# Patient Record
Sex: Female | Born: 1945
Health system: Southern US, Community
[De-identification: ages and names within clinical notes are randomized; demographics above are authoritative.]

## PROBLEM LIST (undated history)

## (undated) DIAGNOSIS — M797 Fibromyalgia: Secondary | ICD-10-CM

## (undated) DIAGNOSIS — I779 Disorder of arteries and arterioles, unspecified: Secondary | ICD-10-CM

## (undated) DIAGNOSIS — Z780 Asymptomatic menopausal state: Secondary | ICD-10-CM

## (undated) DIAGNOSIS — I739 Peripheral vascular disease, unspecified: Secondary | ICD-10-CM

## (undated) DIAGNOSIS — E039 Hypothyroidism, unspecified: Secondary | ICD-10-CM

## (undated) DIAGNOSIS — J45909 Unspecified asthma, uncomplicated: Secondary | ICD-10-CM

## (undated) DIAGNOSIS — M1990 Unspecified osteoarthritis, unspecified site: Secondary | ICD-10-CM

## (undated) DIAGNOSIS — Z9109 Other allergy status, other than to drugs and biological substances: Secondary | ICD-10-CM

## (undated) DIAGNOSIS — I10 Essential (primary) hypertension: Secondary | ICD-10-CM

## (undated) DIAGNOSIS — E785 Hyperlipidemia, unspecified: Secondary | ICD-10-CM

## (undated) DIAGNOSIS — K219 Gastro-esophageal reflux disease without esophagitis: Secondary | ICD-10-CM

## (undated) HISTORY — PX: ABDOMINAL HYSTERECTOMY: SHX81

## (undated) HISTORY — PX: TONSILLECTOMY: SUR1361

---

## 1999-04-13 ENCOUNTER — Other Ambulatory Visit: Admission: RE | Admit: 1999-04-13 | Discharge: 1999-04-13 | Payer: Self-pay | Admitting: *Deleted

## 2000-01-29 ENCOUNTER — Other Ambulatory Visit: Admission: RE | Admit: 2000-01-29 | Discharge: 2000-01-29 | Payer: Self-pay | Admitting: *Deleted

## 2002-07-06 ENCOUNTER — Ambulatory Visit (HOSPITAL_COMMUNITY): Admission: RE | Admit: 2002-07-06 | Discharge: 2002-07-06 | Payer: Self-pay | Admitting: Internal Medicine

## 2002-07-06 ENCOUNTER — Encounter: Payer: Self-pay | Admitting: Internal Medicine

## 2002-08-13 ENCOUNTER — Encounter: Payer: Self-pay | Admitting: Internal Medicine

## 2002-08-13 ENCOUNTER — Ambulatory Visit (HOSPITAL_COMMUNITY): Admission: RE | Admit: 2002-08-13 | Discharge: 2002-08-13 | Payer: Self-pay | Admitting: Internal Medicine

## 2002-08-20 ENCOUNTER — Encounter (INDEPENDENT_AMBULATORY_CARE_PROVIDER_SITE_OTHER): Payer: Self-pay | Admitting: *Deleted

## 2002-08-20 ENCOUNTER — Ambulatory Visit (HOSPITAL_COMMUNITY): Admission: RE | Admit: 2002-08-20 | Discharge: 2002-08-20 | Payer: Self-pay | Admitting: Internal Medicine

## 2002-08-20 ENCOUNTER — Encounter: Payer: Self-pay | Admitting: Internal Medicine

## 2002-11-17 ENCOUNTER — Observation Stay (HOSPITAL_COMMUNITY): Admission: RE | Admit: 2002-11-17 | Discharge: 2002-11-18 | Payer: Self-pay | Admitting: General Surgery

## 2002-11-17 ENCOUNTER — Encounter (INDEPENDENT_AMBULATORY_CARE_PROVIDER_SITE_OTHER): Payer: Self-pay

## 2004-06-05 ENCOUNTER — Ambulatory Visit (HOSPITAL_COMMUNITY): Admission: RE | Admit: 2004-06-05 | Discharge: 2004-06-05 | Payer: Self-pay | Admitting: *Deleted

## 2004-06-05 ENCOUNTER — Encounter (INDEPENDENT_AMBULATORY_CARE_PROVIDER_SITE_OTHER): Payer: Self-pay | Admitting: *Deleted

## 2005-04-23 ENCOUNTER — Emergency Department (HOSPITAL_COMMUNITY): Admission: EM | Admit: 2005-04-23 | Discharge: 2005-04-23 | Payer: Self-pay | Admitting: Family Medicine

## 2005-08-27 ENCOUNTER — Ambulatory Visit (HOSPITAL_COMMUNITY): Admission: RE | Admit: 2005-08-27 | Discharge: 2005-08-27 | Payer: Self-pay | Admitting: *Deleted

## 2005-08-27 ENCOUNTER — Encounter (INDEPENDENT_AMBULATORY_CARE_PROVIDER_SITE_OTHER): Payer: Self-pay | Admitting: Specialist

## 2006-04-26 ENCOUNTER — Emergency Department (HOSPITAL_COMMUNITY): Admission: EM | Admit: 2006-04-26 | Discharge: 2006-04-26 | Payer: Self-pay | Admitting: Family Medicine

## 2007-02-23 ENCOUNTER — Emergency Department (HOSPITAL_COMMUNITY): Admission: EM | Admit: 2007-02-23 | Discharge: 2007-02-23 | Payer: Self-pay | Admitting: Emergency Medicine

## 2007-11-10 ENCOUNTER — Encounter (INDEPENDENT_AMBULATORY_CARE_PROVIDER_SITE_OTHER): Payer: Self-pay | Admitting: *Deleted

## 2007-11-10 ENCOUNTER — Ambulatory Visit (HOSPITAL_COMMUNITY): Admission: RE | Admit: 2007-11-10 | Discharge: 2007-11-10 | Payer: Self-pay | Admitting: *Deleted

## 2009-01-09 ENCOUNTER — Emergency Department (HOSPITAL_COMMUNITY): Admission: EM | Admit: 2009-01-09 | Discharge: 2009-01-09 | Payer: Self-pay | Admitting: Emergency Medicine

## 2009-04-16 HISTORY — PX: FEMORAL ARTERY STENT: SHX1583

## 2010-05-29 ENCOUNTER — Observation Stay (HOSPITAL_COMMUNITY)
Admission: RE | Admit: 2010-05-29 | Discharge: 2010-05-30 | Disposition: A | Discharge status: Home / Self Care | Payer: 59 | Source: Ambulatory Visit | Attending: Cardiology | Admitting: Cardiology

## 2010-05-29 DIAGNOSIS — F17200 Nicotine dependence, unspecified, uncomplicated: Secondary | ICD-10-CM | POA: Insufficient documentation

## 2010-05-29 DIAGNOSIS — E785 Hyperlipidemia, unspecified: Secondary | ICD-10-CM | POA: Insufficient documentation

## 2010-05-29 DIAGNOSIS — I70219 Atherosclerosis of native arteries of extremities with intermittent claudication, unspecified extremity: Principal | ICD-10-CM | POA: Insufficient documentation

## 2010-05-29 DIAGNOSIS — I10 Essential (primary) hypertension: Secondary | ICD-10-CM | POA: Insufficient documentation

## 2010-05-29 LAB — CBC
HCT: 37.5 % (ref 36.0–46.0)
Hemoglobin: 12.7 g/dL (ref 12.0–15.0)
MCH: 28.8 pg (ref 26.0–34.0)
MCV: 85 fL (ref 78.0–100.0)
RBC: 4.41 MIL/uL (ref 3.87–5.11)

## 2010-05-29 LAB — POCT ACTIVATED CLOTTING TIME
Activated Clotting Time: 240 seconds
Activated Clotting Time: 228 seconds

## 2010-05-29 LAB — BASIC METABOLIC PANEL
CO2: 28 mEq/L (ref 19–32)
Chloride: 103 mEq/L (ref 96–112)
Glucose, Bld: 106 mg/dL — ABNORMAL HIGH (ref 70–99)
Potassium: 3.2 mEq/L — ABNORMAL LOW (ref 3.5–5.1)
Sodium: 139 mEq/L (ref 135–145)

## 2010-05-29 LAB — HEMOGLOBIN AND HEMATOCRIT, BLOOD: Hemoglobin: 12.4 g/dL (ref 12.0–15.0)

## 2010-05-29 LAB — PLATELET COUNT: Platelets: 235 10*3/uL (ref 150–400)

## 2010-05-30 LAB — CBC
HCT: 35.2 % — ABNORMAL LOW (ref 36.0–46.0)
Hemoglobin: 11.8 g/dL — ABNORMAL LOW (ref 12.0–15.0)
MCHC: 33.5 g/dL (ref 30.0–36.0)
MCV: 87.3 fL (ref 78.0–100.0)
RDW: 12.9 % (ref 11.5–15.5)

## 2010-05-30 LAB — BASIC METABOLIC PANEL
BUN: 12 mg/dL (ref 6–23)
CO2: 28 mEq/L (ref 19–32)
Calcium: 9.4 mg/dL (ref 8.4–10.5)
GFR calc non Af Amer: >60 mL/min (ref >60)
Glucose, Bld: 116 mg/dL — ABNORMAL HIGH (ref 70–99)
Potassium: 3.4 mEq/L — ABNORMAL LOW (ref 3.5–5.1)

## 2010-06-01 NOTE — Procedures (Signed)
NAME:  [name], [name]                 ACCOUNT NO.:  [id number]  MEDICAL RECORD NO.:  [id number]           PATIENT TYPE:  O  LOCATION:  [facility]                         FACILITY:  [facility]  PHYSICIAN:  Cristy Hilts. Jacinto Halim, MD       DATE OF BIRTH:  14-Apr-1946  DATE OF PROCEDURE:  05/29/2010 DATE OF DISCHARGE:                   PERIPHERAL VASCULAR INVASIVE PROCEDURE   PROCEDURE PERFORMED: 1. Left femoral arterial access using Doppler needle. 2. Abdominal aortogram. 3. Abdominal aortogram with bifemoral runoff. 4. Crossover from the left femoral artery into the right femoral     artery and placement of catheter tip into the right femoral artery. 5. Right femoral arteriogram with distal runoff. 6. PTA and stenting of the right superficial femoral artery.  INDICATIONS:  Ms. [name] is a 64-hour female with hypertension, hyperlipidemia, prior tobacco use which she has almost quit and now smoking about 2-3 cigarettes a day and on the verge of quitting.  She has had known peripheral arterial disease.  She has been on progressive medical therapy, in spite of this, she continues to have significant lifestyle-limiting claudication.  Outpatient evaluation of her lower extremity had revealed ABIs of 0.49 on the right and 0.52 on the left with suggestion of SFA disease.  She is now brought to the peripheral angiography suite for evaluation of peripheral anatomy with possible eye towards revascularization.  ABDOMINAL AORTOGRAM:  Abdominal aortogram revealed presence of two renal arteries, one on either side.  They were widely patent.  Aortoiliac bifurcation was widely patent.  Iliac arteries were widely patent.  Right femoral artery with distal runoff revealed diffuse 40% stenosis in the proximal segment.  Mid segment of the right SFA is pretty much subtotally occluded, and no significant collaterals were evident.  The SFA appears to be relatively healthy just outside of the Hunter's canal.  Left  femoral artery reveals severe disease all the way from the proximal segment.  There is a 70% to 80% stenosis in the proximal segment followed by moderate to long segment occlusion of the left SFA, which reconstitutes just outside of the Hunter's canal.  Below the left knee, there is three-vessel runoff as evident also in the right leg below the right knee.  INTERVENTION DATA:  Successful PTA and stenting of the right superficial femoral artery.  Three overlapping stents had to be placed into the right SFA.  Very difficult procedure.  Artery appears to be very frail. Even with stent palpation, there was evidence of proximal edge dissection, hence patient received a total of three stents measuring 6.0 x 120, 6.0 x 60, and a 6.0 x 40 mm, going from distal to proximal in the midsegment of the right SFA.  These stents were EverFlex self-expanding stents.  The initial stent was postdilated, a 5 mm x 120 mm Fox Cross balloon was utilized.  Within the stent, a 6 atmosphere pressure inflation was performed, and outside of the stents, both proximal and distal, 3 atmospheric balloon inflations were performed for a minute each.  Post-intervention angiography with excellent results.  There was three- vessel runoff noted without any evidence of dissection or thrombus.  RECOMMENDATIONS:  The  patient will need left SFA angioplasty.  This is a moderate to long segment occlusion of the left SFA and starts at the ostium.  However, she is significantly symptomatic, and the ABIs are markedly reduced, hence we will plan to do this on an elective basis.  A total of 225 mL of contrast was utilized for diagnostic and investigative procedure.  TECHNIQUE OF PROCEDURE:  Under sterile precautions, using a 5-French left femoral arterial access using Doppler needle, left femoral arterial access was obtained.  A 5-French Omniflush catheter was advanced under the abdominal aorta over a Versacore wire.  Abdominal  aortogram was performed.  The same catheter was then utilized to carefully cross over from the left femoral artery to the right femoral artery.  The same Versacore wire was advanced into the right proximal superficial femoral artery, and the Omniflush catheter was advanced into the right femoral artery and right femoral arteriogram was performed.  The lesion length was measured with the help of measuring ruler.  TECHNIQUE OF INTERVENTION:  Using heparin for anticoagulation, maintaining ACT greater than 250, the Versacore wire was gently advanced through the right SFA occlusion.  Because of total occlusion after passing the wire, I performed balloon angioplasty with a 5.0 x 120 mm Fox Cross balloon at around 6 atmospheric pressure for 1 minute followed by angiography.  Significant dissections were noted at the stenotic segments, but lesion length appeared to be adequate to be covered with a 120 mm stent.  A 6 x 120 mm EverFlex balloon was then deployed. Unfortunately, after deployment of the stent, there was no flow that was evident.  We suspected distal dissection, hence an end-hole catheter was advanced into the distal end of the stents after having given intra- arterial nitroglycerin, and angiography was performed.  The distal end appeared to be stable without any significant stenosis.  This distal end was also postdilated with very low-pressure 5 x 120 mm Fox Cross balloon at 2-3 atmospheric pressure for about 45-60 seconds, both proximal and distal.  Because of the slow flow, we thought that the proximal edge dissection was probably the etiology, and we covered that with a 6 x 60 mm EverFlex stent.  Again, significant slow flow was evident.  All the wires were withdrawn after passing the end-hole 5-French catheter, and careful aspiration was performed in the event that there was thrombus in the right femoral artery.  No significant thrombus was evident through the aspirated blood.  I  also gave intra-arterial Integrilin through the end-hole catheter and also through the sidearm of the crossover sheath. Please note, the crossover through the intervention was performed using a 7-French crossover sheath which was placed into the right femoral artery.  We implanted 6 x 60 in the proximal edge dissection, EverFlex self- expanding stent and another 6.6 x 40 mm EverFlex stent.  Multiple-angle angiography was performed.  Excellent flow was evident through the leg with maintenance of three-vessel runoff.  At this point, we felt very safe for all the wires and catheters to be withdrawn.  The long 7-French cross-over sheath was gently pulled back into the left femoral artery and exchanged for a short sheath.  The sheath was sutured in place.  The patient received a total of 6000 units of intravenous heparin. Integrilin was started as a bailout, which will be continued until the bottle is infused.  She will be continued with aspirin, and Plavix will be initiated.  The patient tolerated the procedure well.  No significant __________ other  complications were evident.  Total of 60 to 70 mL of blood was lost during the procedure.     Cristy Hilts. Jacinto Halim, MD     JRG/MEDQ  D:  05/29/2010  T:  05/30/2010  Job:  [id number]  Electronically Signed by Yates Decamp MD on 06/01/2010 01:28:14 PM

## 2010-06-01 NOTE — Discharge Summary (Signed)
  NAME:  [name], [name]                 ACCOUNT NO.:  [id number]  MEDICAL RECORD NO.:  [id number]           PATIENT TYPE:  I  LOCATION:  3711                         FACILITY:  [facility]  PHYSICIAN:  Cristy Hilts. Jacinto Halim, MD       DATE OF BIRTH:  [birth year]  DATE OF ADMISSION:  05/29/2010 DATE OF DISCHARGE:  05/30/2010                              DISCHARGE SUMMARY   DISCHARGE DIAGNOSES: 1. Peripheral arterial disease with claudication status post     successful percutaneous transluminal angioplasty and stenting of     the right superficial femoral artery with implantation of 3     overlapping 6.0 x 120, 6.0 x 60, and 6.0 x 40 mm self-expanding     EverFlex stents in the mid and proximal right superficial femoral     artery. 2. Hypertension, controlled. 3. Hyperlipidemia, controlled. 4. Tobacco abuse, almost on the verge of quitting, now smokes only     about 3 cigarettes a day which she has already been counseled     against tobacco use.  RECOMMENDATIONS:  The patient will be discharged home today.  She will be started on Plavix as a new medication.  She will also stop her Nexium and start pantoprazole 40 mg p.o. daily instead of Nexium.  DISCHARGE MEDICATIONS: 1. Aspirin 81 mg p.o. daily. 2. Plavix 75 mg p.o. daily. 3. Pantoprazole 40 mg p.o. daily. 4. Percocet 5/325 one p.o. t.i.d. p.r.n. 5. Advair Diskus 1 puff b.i.d. 6. Amitriptyline 150 mg p.o. daily. 7. Exforge HCT 10/320/24 one p.o. daily. 8. Premarin 0.625 mg p.o. daily. 9. Synthroid 150 mcg p.o. daily.  FOLLOWUP:  She will follow up on the outpatient basis in about 2 weeks. However, she has a significant left superficial femoral artery stenoses. She may be brought back on an elective basis for angioplasty of the same.  BRIEF HISTORY:  [name] is a 65 year old female who was admitted yesterday for elective angioplasty of her right superficial femoral artery because of symptomatic right leg claudication.  She  underwent successful angioplasty of the right superficial femoral artery.  She did have significant pain after opening up the vessel as the vessel was occluded.  On physical exam, her legs were warm and there were bounding pulses evident postprocedure.  Previously, there were Doppler pulses only.  DISCHARGE PHYSICAL EXAMINATION:  VITAL SIGNS:  She remained hemodynamically stable with the patient being afebrile with a heart rate of 77 beats per minute, respirations 14, blood pressure 120/76 mmHg. CARDIAC:  S1-S2 was normal without any gallop or murmur. CHEST:  Clear. ABDOMEN:  Soft.  Left groin site was stable without any hematoma.     Cristy Hilts. Jacinto Halim, MD     JRG/MEDQ  D:  05/30/2010  T:  05/30/2010  Job:  [id number]  cc:   Massie Maroon, MD  Electronically Signed by Yates Decamp MD on 06/01/2010 01:28:20 PM

## 2010-06-20 ENCOUNTER — Observation Stay (HOSPITAL_COMMUNITY)
Admission: RE | Admit: 2010-06-20 | Discharge: 2010-06-21 | Disposition: A | Discharge status: Home / Self Care | Payer: 59 | Source: Ambulatory Visit | Attending: Cardiology | Admitting: Cardiology

## 2010-06-20 DIAGNOSIS — F17200 Nicotine dependence, unspecified, uncomplicated: Secondary | ICD-10-CM | POA: Insufficient documentation

## 2010-06-20 DIAGNOSIS — I70219 Atherosclerosis of native arteries of extremities with intermittent claudication, unspecified extremity: Principal | ICD-10-CM | POA: Insufficient documentation

## 2010-06-20 DIAGNOSIS — I10 Essential (primary) hypertension: Secondary | ICD-10-CM | POA: Insufficient documentation

## 2010-06-20 DIAGNOSIS — E785 Hyperlipidemia, unspecified: Secondary | ICD-10-CM | POA: Insufficient documentation

## 2010-06-21 LAB — BASIC METABOLIC PANEL
BUN: 11 mg/dL (ref 6–23)
CO2: 30 mEq/L (ref 19–32)
Calcium: 9 mg/dL (ref 8.4–10.5)
Chloride: 104 mEq/L (ref 96–112)
Creatinine, Ser: 0.48 mg/dL (ref 0.4–1.2)
GFR calc Af Amer: >60 mL/min (ref >60)
Glucose, Bld: 100 mg/dL — ABNORMAL HIGH (ref 70–99)

## 2010-06-21 LAB — CBC
Hemoglobin: 9.7 g/dL — ABNORMAL LOW (ref 12.0–15.0)
MCH: 29.1 pg (ref 26.0–34.0)
MCHC: 33.7 g/dL (ref 30.0–36.0)
MCV: 86.5 fL (ref 78.0–100.0)

## 2010-07-10 NOTE — Discharge Summary (Signed)
  NAME:  [name], [name]                 ACCOUNT NO.:  [id number]  MEDICAL RECORD NO.:  [id number]           PATIENT TYPE:  I  LOCATION:  4708                         FACILITY:  [facility]  PHYSICIAN:  Cristy Hilts. Jacinto Halim, MD       DATE OF BIRTH:  [birth year]  DATE OF ADMISSION:  06/20/2010 DATE OF DISCHARGE:  06/21/2010                              DISCHARGE SUMMARY   DISCHARGE DIAGNOSES: 1. Peripheral arterial disease with lifestyle-limiting claudication.     She had successful percutaneous transluminal angioplasty and     stenting of her right superficial femoral artery on May 29, 2010.  Successful percutaneous transluminal angioplasty and     atherectomy followed by balloon angioplasty of the left superficial     femoral artery using Diamondback atherectomy catheter on June 20, 2010. 2. Hypertension at goal. 3. Hyperlipidemia. 4. Tobacco use which she has almost quit only smoking about 1 or 2     cigarettes a day.  RECOMMENDATIONS:  The patient will be discharged home today.  She is stable from a vascular standpoint to be discharged.  She will follow up with me in 2 weeks.  Her discharge medications will include: 1. Percocet 5/325 one t.i.d. p.r.n. 2. Exforge HCT 10/320/25 one p.o. daily. 3. Aspirin 81 mg p.o. daily. 4. Advair Diskus 100/50 one puff b.i.d. 5. Premarin 0.625 mg p.o. daily. 6. Synthroid 150 mcg p.o. daily. 7. Pantoprazole 40 mg p.o. daily. 8. Amitriptyline 150 mg p.o. daily. 9. Crestor 10 mg p.o. daily. 10.Plavix 75 mg p.o. daily. 11.Pletal 100 mg p.o. b.i.d.  BRIEF HISTORY:  [name] is a very pleasant 65 year old female with a history of known peripheral arterial disease has been having lifestyle-limiting medication and she had undergone peripheral angiography and followed by angioplasty of the right SFA on May 29, 2010.  She was found to have high-grade stenosis of the left SFA which was occluded.  Because of lifestyle-limiting claudication,  she is now brought to the peripheral angiography to reevaluate the feasibility of proceeding with angioplasty of the same.  She underwent successful at atherectomy of the left SFA with a Diamondback catheter on June 20, 2010, without any complications.  Next day, she felt she was hemodynamically stable and was felt to be ready for discharge.  The patient will be followed up in the outpatient basis. Smoking cessation has again been discussed with the patient.     Cristy Hilts. Jacinto Halim, MD     JRG/MEDQ  D:  06/21/2010  T:  06/21/2010  Job:  [id number]  Electronically Signed by Yates Decamp MD on 07/10/2010 09:50:37 AM

## 2010-07-10 NOTE — Procedures (Signed)
NAME:  [name], [name]                 ACCOUNT NO.:  [id number]  MEDICAL RECORD NO.:  [id number]           PATIENT TYPE:  I  LOCATION:  4708                         FACILITY:  [facility]  PHYSICIAN:  Cristy Hilts. Jacinto Halim, MD       DATE OF BIRTH:  09-04-1945  DATE OF PROCEDURE:  06/20/2010 DATE OF DISCHARGE:                   PERIPHERAL VASCULAR INVASIVE PROCEDURE   PROCEDURES PERFORMED: 1. Right femoral arterial access with crossover to the left femoral     artery. 2. Left femoral arteriogram. 3. PTA and atherectomy of the long segment occlusion of the left     superficial femoral artery with a 1.5 mm Predator Diamondback     atherectomy device followed by balloon angioplasty with a 4.0 x 220     mm Savvy balloon.  INDICATIONS:  [name] is a 65 year old female with history of known peripheral arterial disease.  She had undergone successful PTA and stenting of her right superficial femoral artery on May 29, 2010. At that time, she was found to have a long segment occlusion of the left superficial femoral artery.  Because of symptomatic and lifestyle- limiting claudication, she is now brought to the Peripheral Angiography Suite to reevaluate her for possible revascularization and possible atherectomy of the left superficial femoral artery.  ANGIOGRAPHIC DATA:  Left femoral arteriogram with distal runoff:  The left femoral arteriogram with distal runoff revealed a long segment occlusion of the left superficial femoral artery.  There were collaterals noted from the left profunda femoral artery to the distal left superficial femoral artery.  Below the left knee, there was 2-vessel runoff in the form of peroneal and posterior tibial artery.  Anterior tibial artery is occluded.  INTERVENTION DATA:  Successful atherectomy followed by PTA.  Diamondback 1.5-mm Predator atherectomy catheter was utilized followed by balloon angioplasty with a 4.0 x 22-cm Savvy balloon at 4 atmospheric  pressure. Overall stenosis was reduced from 100% to less than 10-20% with brisk flow through the atherectomy site.  Two-vessel runoff was maintained at the end of the procedure.  RECOMMENDATIONS:  The patient will be observed overnight.  He will be discharged home in the morning.  We will continue aggressive modification including smoking cessation, which she has almost quit and smokes only about 1 or 2 cigarettes which we are on plans of  quitting completely.  I will discharge her in the morning if she remains stable.  A total of 90 mL of contrast was utilized for diagnostic and interventional procedure.  The right femoral arterial access was closed with Cordis ExoSeal device with excellent hemostasis.  PROCEDURE TECHNIQUE:  Under sterile precautions using a 7-French right femoral arterial access, a 7-French Ansel crossover sheath, I placed the tip of the catheter into the left femoral artery.  Left femoral arteriogram with distal runoff was performed.  After we looked at the angiogram, it was felt that I proceed with atherectomy and the angiography appeared to be suitable for atherectomy. Hence, heparin was utilized to cross over the occlusion.  With utilization of a [employer] treasure 12-gram support 300-cm wire which is a 0.018th of an inch wire, I was able to carefully  with moderate amount of difficulty cross the chronic total occlusion of left superficial femoral artery.  I placed the tip of the wire into the free lumen of the popliteal artery.  Then, I advanced a seeker crossing catheter and placed it into the left popliteal artery.  Left popliteal position was confirmed by injecting contrast through this catheter. Then, I exchanged to ViperWire Advance, which is a 300-cm 0.018th of an inch atherectomy wire.  I was able to cross the lesion with the Predator Diamondback atherectomy catheter, and a 1.5-mm bur was utilized. Multiple atherectomies were performed at low to high  speeds.  Having performed the atherectomy, angiography was performed.  During the procedure, intra-arterial nitroglycerin was also administered. Following this, I was able to use a 4-mm x 220-mm Savvy balloon. Balloon inflation was performed carefully at 4 atmospheric pressure for 3 minutes followed by angiography.  Excellent results were noted with brisk flow.  Distal runoff was also maintained at the end of the procedure.  Hence, the wire was withdrawn and introducer sheath was gently pulled back to the right femoral artery.  Right femoral arteriogram was performed and the arterial access was closed with the ExoSeal vascular closure with excellent hemostasis.  Overall, the patient tolerated the procedure.  No immediate complications were noted. During the procedure, heparin was administered and the ACT was maintained at greater than 250 seconds.     Cristy Hilts. Jacinto Halim, MD     JRG/MEDQ  D:  06/20/2010  T:  06/21/2010  Job:  [id number]  Electronically Signed by Yates Decamp MD on 07/10/2010 09:50:49 AM

## 2010-07-21 LAB — POCT URINALYSIS DIP (DEVICE)
Glucose, UA: NEGATIVE mg/dL
Hgb urine dipstick: NEGATIVE
Nitrite: NEGATIVE
Protein, ur: NEGATIVE mg/dL
Specific Gravity, Urine: 1.01 (ref 1.005–1.030)
Urobilinogen, UA: 0.2 mg/dL (ref 0.0–1.0)
pH: 5 (ref 5.0–8.0)

## 2010-08-29 NOTE — Op Note (Signed)
[name], [name]                 ACCOUNT NO.:  [id number]   MEDICAL RECORD NO.:  [id number]          PATIENT TYPE:  AMB   LOCATION:  ENDO                         FACILITY:  [facility]   PHYSICIAN:  Georgiana Spinner, M.D.    DATE OF BIRTH:  August 08, 1945   DATE OF PROCEDURE:  11/10/2007  DATE OF DISCHARGE:                               OPERATIVE REPORT   PROCEDURE:  Upper endoscopy with biopsy.   INDICATIONS:  GERD   ANESTHESIA:  Fentanyl 50 mcg, Versed 7 mg.   PROCEDURE:  With the patient mildly sedated, in the left lateral  decubitus position, the Pentax videoscopic endoscope was inserted in the  mouth and passed under direct vision through the esophagus which  appeared normal.  Distal esophagus and the squamocolumnar junction were  not well seen, so we advanced into the stomach; fundus, body, antrum  were normal.  Duodenal bulb showed a umbilicated raised lesion which  might be a pancreatic rest which I photographed and biopsied, second  portion duodenum appeared normal.  From this point, the endoscope was  slowly withdrawn taking circumferential views of duodenal mucosa, until  the endoscope had been pulled back into the stomach and placed in  retroflexion to view the stomach from below.  The endoscope was  straightened and withdrawn taking circumferential views of the remaining  gastric and esophageal mucosa, stopping then in the distal esophagus  where we biopsied an area that might be Barrett's.  The endoscope was  withdrawn taking circumferential views of the remaining gastric and  esophageal mucosa.  The patient's vital signs and pulse oximeter  remained stable.  The patient tolerated the procedure well without  apparent complications.   FINDINGS:  Umbilicated area in the duodenal bulb, probably a pancreatic  rest, await biopsy report.  Question of Barrett's esophagus versus  normal, biopsied.  Await biopsy report.  The patient will call me for  results and follow up with me as  an outpatient.           ______________________________  Georgiana Spinner, M.D.     GMO/MEDQ  D:  11/10/2007  T:  11/10/2007  Job:  [id number]

## 2010-09-01 NOTE — Op Note (Signed)
NAMEMURIAL, BEAM                 ACCOUNT NO.:  [id number]   MEDICAL RECORD NO.:  [id number]          PATIENT TYPE:  AMB   LOCATION:  ENDO                         FACILITY:  [facility]   PHYSICIAN:  Georgiana Spinner, M.D.    DATE OF BIRTH:  05-04-1945   DATE OF PROCEDURE:  DATE OF DISCHARGE:                                 OPERATIVE REPORT   PROCEDURE:  Upper endoscopy.   INDICATIONS:  GERD.  Rule out Barrett's esophagus.   ANESTHESIA:  Demerol 30, Versed 4 mg.   PROCEDURE:  With the patient mildly sedated in left lateral decubitus  position, the Olympus videoscopic endoscope was inserted in the mouth and  passed under direct vision through the esophagus and I did not see the  distal esophagus or the squamocolumnar junction well in this view because of  overlapping mucosa, so I entered into the stomach, placed the endoscope on  retroflexion to view the squamocolumnar junction from below and I could see  no evidence of Barrett's at this point.  The endoscope was then straightened  and advanced further into the stomach.  Fundus, body, and antrum appeared  normal, duodenal bulb showed a polyp which was photographed, and biopsied.  A second portion of the duodenum appeared normal.  From this point the  endoscope was slowly withdrawn taking circumferential views of duodenal  mucosa until the endoscope was then pulled back into the stomach and placed  in retroflexion to view the stomach from below.  Once again the endoscope  was then straightened and withdrawn taking circumferential views of the  remaining gastric and esophageal mucosa.  The patient's vital signs, pulse  oximeter remained stable, the patient tolerated procedure well without  apparent complications.   FINDINGS:  Duodenal polyp biopsied.  Otherwise an unremarkable examination  at this time with no evidence of Barrett's esophagus.   PLAN:  Await biopsy report.  The patient will call me for results and follow-  up with me as an  outpatient.           ______________________________  Georgiana Spinner, M.D.     GMO/MEDQ  D:  08/27/2005  T:  08/27/2005  Job:  [id number]

## 2010-09-01 NOTE — Op Note (Signed)
[name], [name]                 ACCOUNT NO.:  [id number]   MEDICAL RECORD NO.:  [id number]          PATIENT TYPE:  AMB   LOCATION:  ENDO                         [facility]ACILITY:  [facility]   PHYSICIAN:  Georgiana Spinner, M.D.    DATE OF BIRTH:  Mar 13, 1946   DATE OF PROCEDURE:  06/05/2004  DATE OF DISCHARGE:                                 OPERATIVE REPORT   PROCEDURE:  Upper endoscopy.   INDICATIONS:  GERD.   ANESTHESIA:  Demerol 50, Versed 6 mg.   PROCEDURE:  With the patient mildly sedated in the left lateral decubitus  position, the Olympus videoscopic endoscope was inserted in the mouth,  passed under direct vision through the esophagus which appeared normal  except for the distal esophagus at the gastroesophageal squamocolumnar  junction and it appeared somewhat indistinct. This was photographed and  biopsied. We entered into the stomach. The fundus, body, antrum, duodenal  bulb, and second portion of the duodenum appeared normal. From this point,  the endoscope was slowly withdrawn taking circumferential views of the  duodenal mucosa until the endoscope had been pulled back into the stomach,  placed in retroflexion to view the stomach from below. The endoscope was  straightened and withdrawn taking circumferential views of remaining gastric  and esophageal mucosa. The patient's vital signs and pulse oximeter remained  stable. The patient tolerated procedure well without complications.   FINDINGS:  The squamocolumnar junction was not well seen and therefore we  biopsied this area. Await biopsy report. The patient will call me for  results and follow-up with me as an outpatient. Proceed to colonoscopy.      GMO/MEDQ  D:  06/05/2004  T:  06/05/2004  Job:  [id number]

## 2010-09-01 NOTE — Op Note (Signed)
NAME:  [name], HOFFART                           ACCOUNT NO.:  [id number]   MEDICAL RECORD NO.:  [id number]                   PATIENT TYPE:  AMB   LOCATION:  DAY                                  FACILITY:  [facility]   PHYSICIAN:  Adolph Pollack, M.D.            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   OPERATION/PROCEDURE:  Right thyroid lobectomy.   SURGEON:  Adolph Pollack, M.D.   ASSISTANT:  Anselm Pancoast. Zachery Dakins, M.D.   ANESTHESIA:  General.   INDICATIONS:  [name] is a 65 year old female who has history of a toxic  nodular goiter, status post iodine ablation.  She has been hypothyroid  because of that and was on thyroid replacement therapy.  The right thyroid  nodule was noted and an FNA was done which demonstrated follicular cells  with a peripheral cell type process.  She now presents for a partial  possible total thyroidectomy.  The procedure and the risks were discussed  with her preoperatively.   DESCRIPTION OF PROCEDURE:  She was seen in the holding area and then brought  to the operating room, placed supine on the operating table and general  anesthesia was administered.  The roll was placed under shoulders and her  neck was slightly extended.  The incision was marked with a marking pen in  the lower neck, one fingerbreadth above the clavicles.  The neck was then  sterilely prepped and draped.  An incision was made one fingerbreadth above  the clavicles in a transverse fashion, dividing the skin and subcutaneous  tissue and platysma muscle.  Subplatysmal flaps were then raised to the  level of the thyroid cartilage superiorly and the sternal notch inferiorly.  A  midline between the strap muscles was divided with the cautery.  The strap  muscles were then freed from the small right thyroid gland and a larger  right thyroid nodule using the blunt dissection and cautery.  I began at the  superior pole and staying on the thyroid capsule, I divided some multiple  branches of the superior thyroid artery and the superior pole.  I then  identified the _______ ligament of Berry and ligated this and divided it.  Using blunt dissection I was able to rotate the thyroid gland medially.  I  identified the superior parathyroid node and preserved it.  I then looked  and examined the inferior pole and using blunt dissection, swept the  inferior parathyroid gland away from my plane of dissection.  Staying on the  capsule, I divided small branches of inferior thyroid artery which were then  clipped.  I then divided the  middle thyroid vein between clips and ties.  The right recurrent laryngeal nerve was identified and then not injured.   At this point I had the nodule and a small atrophic portion of thyroid gland  completely mobilized.  I then clamped the isthmus with a Tresa Endo and then  divided it and sent this specimen off to pathology. Frozen section was  consistent with a follicular cell process with possible Hurthle cell  features although normal latency was noted.   I then suture ligated the thyroid isthmus with Vicryl suture.  Following  this I irrigated out the wound.  I inspected the area and saw no bleeding.  All vessels appeared to have been clipped or tied.  The recurrent laryngeal  nerve remained intact.  Parathyroids looked viable.   Next, I placed a piece of Surgicel in the wound, then closed the strap  muscles with interrupted 3-0 Vicryl sutures.  The platysmal muscle was  closed with interrupted 3-0 Vicryl sutures.  The skin was closed with 4-0  Monocryl subcuticular stitch.  Steri-Strips and sterile dressings were  applied.   She tolerated the  procedure well without any apparent complications and was  taken to the recovery room in satisfactory condition.  Will have to wait for  the permanent pathology which will take three or four days.  She will be  made an outpatient in the bed.                                                Adolph Pollack, M.D.    Kari Baars  D:  11/17/2002  T:  11/17/2002  Job:  [id number]   cc:   Janae Bridgeman. Eloise Harman., M.D.  [address]  [place]  [place] [address]  Fax: [phone]

## 2010-09-01 NOTE — Op Note (Signed)
NAME:  [name], [name]                 ACCOUNT NO.:  [id number]   MEDICAL RECORD NO.:  [id number]          PATIENT TYPE:  AMB   LOCATION:  ENDO                         FACILITY:  [facility]   PHYSICIAN:  George M. Orr, M.D.    DATE OF BIRTH:  07/01/1945   DATE OF PROCEDURE:  DATE OF DISCHARGE:                                 OPERATIVE REPORT   PROCEDURE:  Upper endoscopy.   INDICATIONS:  GERD.  Rule out Barrett's esophagus.   ANESTHESIA:  Demerol 30, Versed 4 mg.   PROCEDURE:  With the patient mildly sedated in left lateral decubitus  position, the Olympus videoscopic endoscope was inserted in the mouth and  passed under direct vision through the esophagus and I did not see the  distal esophagus or the squamocolumnar junction well in this view because of  overlapping mucosa, so I entered into the stomach, placed the endoscope on  retroflexion to view the squamocolumnar junction from below and I could see  no evidence of Barrett's at this point.  The endoscope was then straightened  and advanced further into the stomach.  Fundus, body, and antrum appeared  normal, duodenal bulb showed a polyp which was photographed, and biopsied.  A second portion of the duodenum appeared normal.  From this point the  endoscope was slowly withdrawn taking circumferential views of duodenal  mucosa until the endoscope was then pulled back into the stomach and placed  in retroflexion to view the stomach from below.  Once again the endoscope  was then straightened and withdrawn taking circumferential views of the  remaining gastric and esophageal mucosa.  The patient's vital signs, pulse  oximeter remained stable, the patient tolerated procedure well without  apparent complications.   FINDINGS:  Duodenal polyp biopsied.  Otherwise an unremarkable examination  at this time with no evidence of Barrett's esophagus.   PLAN:  Await biopsy report.  The patient will call me for results and follow-  up with me as an  outpatient.           ______________________________  George M. Orr, M.D.     GMO/MEDQ  D:  08/27/2005  T:  08/27/2005  Job:  [id number] 

## 2010-09-01 NOTE — Op Note (Signed)
[name], [name]                 ACCOUNT NO.:  [id number]   MEDICAL RECORD NO.:  [id number]          PATIENT TYPE:  AMB   LOCATION:  ENDO                         FACILITY:  [facility]   PHYSICIAN:  Georgiana Spinner, M.D.    DATE OF BIRTH:  08/23/1945   DATE OF PROCEDURE:  06/05/2004  DATE OF DISCHARGE:                                 OPERATIVE REPORT   PROCEDURE:  Colonoscopy.   INDICATIONS:  Colon cancer screening.   ANESTHESIA:  Demerol 50, Versed 6 mg.   PROCEDURE:  With the patient mildly sedated in the left lateral decubitus  position,  subsequently rolled to her back into the right lateral decubitus  position back to her back and then subsequently back to a left lateral  decubitus position, the Olympus videoscopic colonoscope had been inserted in  the rectum and passed under direct vision with pressure applied in terms  described. We reached the cecum identified by ileocecal valve and base of  cecum. There was some food material in the cecum that could not be  suctioned. We washed and suctioned as best we could and no gross lesions  were seen in the cecum. Subsequent, the colonoscope was slowly withdrawn  taking circumferential views of the colonic mucosa stopping only in the  rectum which appeared normal on direct and retroflexed view. The endoscope  was straightened and withdrawn. The patient's vital signs and pulse oximeter  remained stable. The patient tolerated procedure well without apparent  complications.   FINDINGS:  Unremarkable examination.   PLAN:  See endoscopy note for further details.      GMO/MEDQ  D:  06/05/2004  T:  06/05/2004  Job:  [id number]

## 2010-12-12 ENCOUNTER — Ambulatory Visit (HOSPITAL_COMMUNITY)
Admission: RE | Admit: 2010-12-12 | Discharge: 2010-12-12 | Disposition: A | Discharge status: Home / Self Care | Payer: 59 | Source: Ambulatory Visit | Attending: Cardiology | Admitting: Cardiology

## 2010-12-12 DIAGNOSIS — E785 Hyperlipidemia, unspecified: Secondary | ICD-10-CM | POA: Insufficient documentation

## 2010-12-12 DIAGNOSIS — Z87891 Personal history of nicotine dependence: Secondary | ICD-10-CM | POA: Insufficient documentation

## 2010-12-12 DIAGNOSIS — I70219 Atherosclerosis of native arteries of extremities with intermittent claudication, unspecified extremity: Secondary | ICD-10-CM | POA: Insufficient documentation

## 2010-12-12 DIAGNOSIS — I10 Essential (primary) hypertension: Secondary | ICD-10-CM | POA: Insufficient documentation

## 2010-12-26 NOTE — Procedures (Signed)
NAMESUTTON, PLAKE                 ACCOUNT NO.:  [id number]  MEDICAL RECORD NO.:  [id number]  LOCATION:  [facility]                         FACILITY:  [facility]  PHYSICIAN:  Pamella Pert, MD DATE OF BIRTH:  1945-06-26  DATE OF PROCEDURE:  12/12/2010 DATE OF DISCHARGE:                   PERIPHERAL VASCULAR INVASIVE PROCEDURE   PROCEDURE PERFORMED: 1. Right femoral arterial access and crossover into the left femoral     artery. 2. Placement of catheter to the left common femoral artery and     external iliac artery. 3. Selective left femoral arteriogram with distal runoff. 4. PTA and stenting of the left superficial femoral artery with     implantation of 3 overlapping 6.0 x 140 distally, 6.0 x 120 mid,     and 6.0 x 80 mm Cook Zilver 6 mm stent.  INDICATIONS:  Ms. [name] is a 65 year old female with history of known peripheral arterial disease, hypertension, hyperlipidemia, history of tobacco use who had undergone left superficial femoral artery atherectomy in March 2012.  She had slowly gradually developed worsening symptoms of claudication.  Also her ABI had reduced to 0.40.  Because of symptoms of claudication initially although she said she had no discomfort but on further questioning had significantly reduced her activity due to pain and discomfort in her left calf.  The outpatient Doppler suggested reocclusion of the left superficial femoral artery. Hence she is brought to the peripheral angiography suite to reevaluate the peripheral anatomy.  Left femoral arteriogram of distal runoff revealed left superficial femoral artery to be diffusely diseased in the proximal segment followed by total occlusion in the mid segment and reconstitution just outside of the Hunter's canal.  Below the left knee there was 2-vessel runoff in the form of peroneal and posterior tibial artery.  The anti tibial artery was occluded.  The peroneal artery is diffusely diseased with tandem 70%  and a 99% mid stenosis.  However, good flow was evident in the peroneal artery and posterior tibial artery, although the flow was slow to begin with prior to angioplasty.  INTERVENTIONAL DATA:  Successful PTA and stenting of the left superficial femoral artery with reduction of stenosis from 100% to 0% with implantation of 3 overlapping 6 x 140, 6 x 120, and 6 x 18 mm Zilver self-expanding stent into the left distal mid and proximal superficial femoral artery.  Excellent brisk flow was evident postprocedure with excellent filling of the peroneal and posterior tibial arteries.  RECOMMENDATIONS:  The patient will be discharged home today if she remains stable.  She will be followed up on outpatient basis with several of Dopplers.  Also again we have discussed regarding smoking cessation.  She has significantly reduced her smoking to just a few cigarettes a day.  Hopefully she will completely quit this.  She is otherwise on aggressive medical therapy and she will continue the same.  She also has history of right superficial artery stenting with 6 x 120, 6 x 60, 6 x 40 EverFlex stents that was done in February 2012 and outpatient Dopplers had revealed patent stents.  Again there is diffuse disease below the right knee.  Right femoral arteriogram was then performed at the end of the procedure  and access was closed with Perclose with excellent hemostasis.  TECHNIQUE OF PROCEDURE:  Under sterile precautions using a 5-French right femoral arterial access, a 5-French Ansel crossover sheath was utilized to cross into the left common femoral artery with utilization of a 5-French Omni flush catheter over a Versacore wire.  Having placed the sheath into the common femoral artery, left femoral arteriogram with distal runoff was performed.  Then we proceeded with intervention.  TECHNIQUE OF INTERVENTION:  Using heparin for anticoagulation and maintaining ACT greater than 200, I utilized a CXI  0.018 of an inch 150 angle support catheter along with a approach CTO 18 gram wire to cross the totally occluded left superficial femoral artery.  With moderate to great amount of difficulty I was able to cross the stenosis.  Having crossed this, I exchanged the 0.018 of an inch wire with the help of a CXI 0.035 of an inch exchange, 0.035 of an inch catheter with using Versacore wire.  Once I exchanged this, the rest of the procedure was relatively simple, however, placement of the stents was fairly complex but we did have excellent results overall.  I initially predilated the lesions less than 4.0 x 150 mm over the wire balloon at 8 atmospheric pressure throughout the left superficial femoral artery with self- expanding stents as dictated above.  I postdilated these stents with again 5.0 x 200 mm  over the wire balloon and intra-arterial nitroglycerin was administered at multiple episodes and angiography was performed.  Excellent results were evident.  The Versacore wire was pulled out.  Below the knee angiography was performed and then the 6- [place] Ansel sheath that was exchanged prior to stenting and balloonangioplasty from the 5-French sheath was then gently pulled into the right common femoral artery.  Right femoral arteriography was performed through the arterial access sheath and access was closed with Perclose with excellent hemostasis.  The patient tolerated the procedure well. No immediate complications.     Pamella Pert, MD     JRG/MEDQ  D:  12/12/2010  T:  12/12/2010  Job:  [id number]  cc:   Massie Maroon, MD  Electronically Signed by Yates Decamp MD on 12/26/2010 11:14:24 AM

## 2011-03-05 ENCOUNTER — Other Ambulatory Visit: Payer: Self-pay | Admitting: Orthopedic Surgery

## 2011-03-05 DIAGNOSIS — M545 Low back pain: Secondary | ICD-10-CM

## 2011-03-13 ENCOUNTER — Ambulatory Visit
Admission: RE | Admit: 2011-03-13 | Discharge: 2011-03-13 | Disposition: A | Discharge status: Home / Self Care | Payer: 59 | Source: Ambulatory Visit | Attending: Orthopedic Surgery | Admitting: Orthopedic Surgery

## 2011-03-13 DIAGNOSIS — M545 Low back pain: Secondary | ICD-10-CM

## 2012-02-26 ENCOUNTER — Other Ambulatory Visit: Payer: Self-pay | Admitting: Orthopaedic Surgery

## 2012-02-26 DIAGNOSIS — M5410 Radiculopathy, site unspecified: Secondary | ICD-10-CM

## 2012-02-26 DIAGNOSIS — M542 Cervicalgia: Secondary | ICD-10-CM

## 2012-03-01 ENCOUNTER — Ambulatory Visit
Admission: RE | Admit: 2012-03-01 | Discharge: 2012-03-01 | Disposition: A | Discharge status: Home / Self Care | Payer: 59 | Source: Ambulatory Visit | Attending: Orthopaedic Surgery | Admitting: Orthopaedic Surgery

## 2012-03-01 DIAGNOSIS — M5410 Radiculopathy, site unspecified: Secondary | ICD-10-CM

## 2012-03-01 DIAGNOSIS — M542 Cervicalgia: Secondary | ICD-10-CM

## 2012-07-08 ENCOUNTER — Other Ambulatory Visit: Payer: Self-pay | Admitting: Orthopedic Surgery

## 2012-07-09 ENCOUNTER — Encounter (HOSPITAL_COMMUNITY): Payer: Self-pay

## 2012-07-17 ENCOUNTER — Ambulatory Visit (HOSPITAL_COMMUNITY)
Admission: RE | Admit: 2012-07-17 | Discharge: 2012-07-17 | Disposition: A | Discharge status: Home / Self Care | Payer: Medicare Other | Source: Ambulatory Visit | Attending: Orthopedic Surgery | Admitting: Orthopedic Surgery

## 2012-07-17 ENCOUNTER — Encounter (HOSPITAL_COMMUNITY): Payer: Self-pay

## 2012-07-17 ENCOUNTER — Encounter (HOSPITAL_COMMUNITY)
Admission: RE | Admit: 2012-07-17 | Discharge: 2012-07-17 | Disposition: A | Discharge status: Home / Self Care | Payer: Medicare Other | Source: Ambulatory Visit | Attending: Orthopedic Surgery | Admitting: Orthopedic Surgery

## 2012-07-17 DIAGNOSIS — I10 Essential (primary) hypertension: Secondary | ICD-10-CM | POA: Insufficient documentation

## 2012-07-17 DIAGNOSIS — F17200 Nicotine dependence, unspecified, uncomplicated: Secondary | ICD-10-CM | POA: Insufficient documentation

## 2012-07-17 DIAGNOSIS — K219 Gastro-esophageal reflux disease without esophagitis: Secondary | ICD-10-CM | POA: Insufficient documentation

## 2012-07-17 DIAGNOSIS — Z01818 Encounter for other preprocedural examination: Secondary | ICD-10-CM | POA: Insufficient documentation

## 2012-07-17 DIAGNOSIS — IMO0001 Reserved for inherently not codable concepts without codable children: Secondary | ICD-10-CM | POA: Insufficient documentation

## 2012-07-17 DIAGNOSIS — I739 Peripheral vascular disease, unspecified: Secondary | ICD-10-CM | POA: Insufficient documentation

## 2012-07-17 DIAGNOSIS — Z01812 Encounter for preprocedural laboratory examination: Secondary | ICD-10-CM | POA: Insufficient documentation

## 2012-07-17 DIAGNOSIS — E785 Hyperlipidemia, unspecified: Secondary | ICD-10-CM | POA: Insufficient documentation

## 2012-07-17 DIAGNOSIS — J45909 Unspecified asthma, uncomplicated: Secondary | ICD-10-CM | POA: Insufficient documentation

## 2012-07-17 DIAGNOSIS — E039 Hypothyroidism, unspecified: Secondary | ICD-10-CM | POA: Insufficient documentation

## 2012-07-17 DIAGNOSIS — Z01810 Encounter for preprocedural cardiovascular examination: Secondary | ICD-10-CM | POA: Insufficient documentation

## 2012-07-17 HISTORY — DX: Hyperlipidemia, unspecified: E78.5

## 2012-07-17 HISTORY — DX: Fibromyalgia: M79.7

## 2012-07-17 HISTORY — DX: Hypothyroidism, unspecified: E03.9

## 2012-07-17 HISTORY — DX: Asymptomatic menopausal state: Z78.0

## 2012-07-17 HISTORY — DX: Peripheral vascular disease, unspecified: I73.9

## 2012-07-17 HISTORY — DX: Gastro-esophageal reflux disease without esophagitis: K21.9

## 2012-07-17 HISTORY — DX: Unspecified asthma, uncomplicated: J45.909

## 2012-07-17 HISTORY — DX: Other allergy status, other than to drugs and biological substances: Z91.09

## 2012-07-17 HISTORY — DX: Essential (primary) hypertension: I10

## 2012-07-17 LAB — URINALYSIS, ROUTINE W REFLEX MICROSCOPIC
Hgb urine dipstick: NEGATIVE
Leukocytes, UA: NEGATIVE
Nitrite: NEGATIVE
Specific Gravity, Urine: 1.014 (ref 1.005–1.030)
Urobilinogen, UA: 0.2 mg/dL (ref 0.0–1.0)

## 2012-07-17 LAB — ABO/RH: ABO/RH(D): O POS

## 2012-07-17 LAB — APTT: aPTT: 34 seconds (ref 24–37)

## 2012-07-17 LAB — CBC WITH DIFFERENTIAL/PLATELET
Lymphocytes Relative: 41 % (ref 12–46)
Lymphs Abs: 2.2 10*3/uL (ref 0.7–4.0)
Neutrophils Relative %: 48 % (ref 43–77)
Platelets: 243 10*3/uL (ref 150–400)
RBC: 4.5 MIL/uL (ref 3.87–5.11)
WBC: 5.3 10*3/uL (ref 4.0–10.5)

## 2012-07-17 LAB — COMPREHENSIVE METABOLIC PANEL
ALT: 9 U/L (ref 0–35)
AST: 17 U/L (ref 0–37)
Alkaline Phosphatase: 78 U/L (ref 39–117)
CO2: 28 mEq/L (ref 19–32)
GFR calc Af Amer: >90 mL/min (ref >90)
GFR calc non Af Amer: >90 mL/min (ref >90)
Glucose, Bld: 93 mg/dL (ref 70–99)
Potassium: 4 mEq/L (ref 3.5–5.1)
Sodium: 139 mEq/L (ref 135–145)
Total Protein: 7.4 g/dL (ref 6.0–8.3)

## 2012-07-17 LAB — TYPE AND SCREEN: Antibody Screen: NEGATIVE

## 2012-07-17 NOTE — Progress Notes (Signed)
Records requested from Dr Jacinto Halim

## 2012-07-17 NOTE — Pre-Procedure Instructions (Signed)
[name]  07/17/2012   Your procedure is scheduled on:  Wednesday, April 9  Report to [facility].call charge nurse at 8AM.for arrival time.  Call this number if you have problems the morning of surgery: [phone]   Remember:   Do not eat food or drink liquids after midnight.Tuesday night.   Take these medicines the morning of surgery with A SIP OF WATER: Amlodipine,Advair,Synthroid,Protonix    Do not wear jewelry, make-up or nail polish.  Do not wear lotions, powders, or perfumes or deodorant.  Do not shave 48 hours prior to surgery.   Do not bring valuables to the hospital.  Contacts, dentures or bridgework may not be worn into surgery.  Leave suitcase in the car. After surgery it may be brought to your room.  For patients admitted to the hospital, checkout time is 11:00 AM the day of  discharge.      Special Instructions: Shower using CHG 2 nights before surgery and the night before surgery.  If you shower the day of surgery use CHG.  Use special wash - you have one bottle of CHG for all showers.  You should use approximately 1/3 of the bottle for each shower.   Please read over the following fact sheets that you were given: Pain Booklet, Coughing and Deep Breathing, Blood Transfusion Information and Surgical Site Infection Prevention

## 2012-07-18 NOTE — Progress Notes (Addendum)
Anesthesia Chart Review:  Patient is a 67 year old female scheduled for C3-4, C4-5, C5-6 ACDF on 07/23/12 and posterior C6-7, C7-T1, T1-T2 fusion on 07/24/12 by Dr. Yevette Edwards.  History includes smoking, HTN, HLD, GERD, asthma, hypothyroidism, fibromyalgia, PAD s/p right SFA stent, hysterectomy. PCP is listed as Dr. Pearson Grippe. Cardiologist is Dr. Jacinto Halim at [facility] ([facility]  He follows her for PAD.  EKG on 07/17/12 showed NSR, possible LAE, poor r wave progression/cannot rule out anteroseptal infarct (age undetermined).  Overall, I think the EKG appears stable when compared to a prior EKG on 08/02/11 at [facility].  Echo on 08/05/08 [facility]) showed mild concentric LVH, hyperdynamic LV, impaired LV relaxation, trace MR/TR.  Mild aortic sclerosis, no AR.  AV opens well.  Nuclear stress test on 08/05/08 showed normal myocardial perfusion study.  No significant ischemia.  Post-stress EF 83%.  No significant wall motion abnormalities.  Low risk scan.  CXR on 07/17/12 showed no edema or consolidation.  Preoperative labs noted.  I think her EKG is stable.  She had a normal stress test 4 years ago.  If no acute change in her status or new CV symptoms then would anticipate she could proceed as planned.  She will be evaluated by her assigned anesthesiologist preoperatively.  Velna Ochs [facility] Short Stay Center/Anesthesiology Phone [phone] 07/18/2012 11:03 AM

## 2012-07-22 MED ORDER — CEFAZOLIN SODIUM-DEXTROSE 2-3 GM-% IV SOLR
2.0000 g | INTRAVENOUS | Status: AC
  Administered 2012-07-23: 2 g via INTRAVENOUS
  Filled 2012-07-22: qty 50

## 2012-07-23 ENCOUNTER — Inpatient Hospital Stay (HOSPITAL_COMMUNITY)
Admission: RE | Admit: 2012-07-23 | Discharge: 2012-07-25 | DRG: 472 | Disposition: A | Discharge status: Home / Self Care | Payer: Medicare Other | Source: Ambulatory Visit | Attending: Orthopedic Surgery | Admitting: Orthopedic Surgery

## 2012-07-23 ENCOUNTER — Inpatient Hospital Stay (HOSPITAL_COMMUNITY): Payer: Medicare Other

## 2012-07-23 ENCOUNTER — Inpatient Hospital Stay (HOSPITAL_COMMUNITY): Payer: Medicare Other | Admitting: Certified Registered Nurse Anesthetist

## 2012-07-23 ENCOUNTER — Encounter (HOSPITAL_COMMUNITY): Payer: Self-pay | Admitting: Vascular Surgery

## 2012-07-23 ENCOUNTER — Encounter (HOSPITAL_COMMUNITY): Payer: Self-pay | Admitting: *Deleted

## 2012-07-23 ENCOUNTER — Encounter (HOSPITAL_COMMUNITY)
Admission: RE | Disposition: A | Discharge status: Home / Self Care | Payer: Self-pay | Source: Ambulatory Visit | Attending: Orthopedic Surgery

## 2012-07-23 DIAGNOSIS — G959 Disease of spinal cord, unspecified: Secondary | ICD-10-CM | POA: Diagnosis present

## 2012-07-23 DIAGNOSIS — E039 Hypothyroidism, unspecified: Secondary | ICD-10-CM | POA: Diagnosis present

## 2012-07-23 DIAGNOSIS — M4712 Other spondylosis with myelopathy, cervical region: Principal | ICD-10-CM | POA: Diagnosis present

## 2012-07-23 DIAGNOSIS — K219 Gastro-esophageal reflux disease without esophagitis: Secondary | ICD-10-CM | POA: Diagnosis present

## 2012-07-23 DIAGNOSIS — E785 Hyperlipidemia, unspecified: Secondary | ICD-10-CM | POA: Diagnosis present

## 2012-07-23 DIAGNOSIS — IMO0001 Reserved for inherently not codable concepts without codable children: Secondary | ICD-10-CM | POA: Diagnosis present

## 2012-07-23 DIAGNOSIS — M4802 Spinal stenosis, cervical region: Secondary | ICD-10-CM | POA: Diagnosis present

## 2012-07-23 DIAGNOSIS — I10 Essential (primary) hypertension: Secondary | ICD-10-CM | POA: Diagnosis present

## 2012-07-23 HISTORY — DX: Unspecified osteoarthritis, unspecified site: M19.90

## 2012-07-23 HISTORY — PX: ANTERIOR CERVICAL DECOMPRESSION/DISCECTOMY FUSION 4 LEVELS: SHX5556

## 2012-07-23 SURGERY — ANTERIOR CERVICAL DECOMPRESSION/DISCECTOMY FUSION 4 LEVELS
Anesthesia: General | Site: Spine Cervical | Wound class: Clean

## 2012-07-23 MED ORDER — ZOLPIDEM TARTRATE 5 MG PO TABS: 5.0000 mg | ORAL_TABLET | Freq: Every evening | ORAL | Status: DC | PRN

## 2012-07-23 MED ORDER — PHENOL 1.4 % MT LIQD: 1.0000 | OROMUCOSAL | Status: DC | PRN

## 2012-07-23 MED ORDER — LACTATED RINGERS IV SOLN
INTRAVENOUS | Status: DC | PRN
  Administered 2012-07-23 (×3): via INTRAVENOUS

## 2012-07-23 MED ORDER — BISACODYL 5 MG PO TBEC: 5.0000 mg | DELAYED_RELEASE_TABLET | Freq: Every day | ORAL | Status: DC | PRN

## 2012-07-23 MED ORDER — ACETAMINOPHEN 325 MG PO TABS: 650.0000 mg | ORAL_TABLET | ORAL | Status: DC | PRN

## 2012-07-23 MED ORDER — ALUM & MAG HYDROXIDE-SIMETH 200-200-20 MG/5ML PO SUSP: 30.0000 mL | Freq: Four times a day (QID) | ORAL | Status: DC | PRN

## 2012-07-23 MED ORDER — PHENYLEPHRINE HCL 10 MG/ML IJ SOLN
INTRAMUSCULAR | Status: DC | PRN
  Administered 2012-07-23 (×2): 80 ug via INTRAVENOUS

## 2012-07-23 MED ORDER — DOCUSATE SODIUM 100 MG PO CAPS
100.0000 mg | ORAL_CAPSULE | Freq: Two times a day (BID) | ORAL | Status: DC
  Administered 2012-07-23: 100 mg via ORAL
  Filled 2012-07-23 (×3): qty 1

## 2012-07-23 MED ORDER — LIDOCAINE HCL (CARDIAC) 20 MG/ML IV SOLN
INTRAVENOUS | Status: DC | PRN
  Administered 2012-07-23: 55 mg via INTRAVENOUS

## 2012-07-23 MED ORDER — LOSARTAN POTASSIUM 50 MG PO TABS
50.0000 mg | ORAL_TABLET | Freq: Every day | ORAL | Status: DC
  Administered 2012-07-24 – 2012-07-25 (×2): 50 mg via ORAL
  Filled 2012-07-23 (×2): qty 1

## 2012-07-23 MED ORDER — GLYCOPYRROLATE 0.2 MG/ML IJ SOLN
INTRAMUSCULAR | Status: DC | PRN
  Administered 2012-07-23: 0.6 mg via INTRAVENOUS

## 2012-07-23 MED ORDER — LIDOCAINE HCL 4 % MT SOLN
OROMUCOSAL | Status: DC | PRN
  Administered 2012-07-23: 4 mL via TOPICAL

## 2012-07-23 MED ORDER — ATORVASTATIN CALCIUM 20 MG PO TABS
20.0000 mg | ORAL_TABLET | Freq: Every day | ORAL | Status: DC
  Administered 2012-07-23: 20 mg via ORAL
  Filled 2012-07-23 (×3): qty 1

## 2012-07-23 MED ORDER — ARTIFICIAL TEARS OP OINT
TOPICAL_OINTMENT | OPHTHALMIC | Status: DC | PRN
  Administered 2012-07-23: 1 via OPHTHALMIC

## 2012-07-23 MED ORDER — THROMBIN 20000 UNITS EX SOLR
CUTANEOUS | Status: AC
  Filled 2012-07-23: qty 20000

## 2012-07-23 MED ORDER — ONDANSETRON HCL 4 MG/2ML IJ SOLN: 4.0000 mg | INTRAMUSCULAR | Status: DC | PRN

## 2012-07-23 MED ORDER — PANTOPRAZOLE SODIUM 40 MG PO TBEC
40.0000 mg | DELAYED_RELEASE_TABLET | Freq: Every day | ORAL | Status: DC
  Administered 2012-07-25: 40 mg via ORAL
  Filled 2012-07-23 (×2): qty 1

## 2012-07-23 MED ORDER — 0.9 % SODIUM CHLORIDE (POUR BTL) OPTIME
TOPICAL | Status: DC | PRN
  Administered 2012-07-23: 1000 mL

## 2012-07-23 MED ORDER — MORPHINE SULFATE 2 MG/ML IJ SOLN
1.0000 mg | INTRAMUSCULAR | Status: DC | PRN
  Administered 2012-07-23 – 2012-07-24 (×2): 2 mg via INTRAVENOUS
  Filled 2012-07-23 (×2): qty 1

## 2012-07-23 MED ORDER — DEXAMETHASONE SODIUM PHOSPHATE 4 MG/ML IJ SOLN
INTRAMUSCULAR | Status: DC | PRN
  Administered 2012-07-23: 4 mg via INTRAVENOUS

## 2012-07-23 MED ORDER — AMITRIPTYLINE HCL 75 MG PO TABS
150.0000 mg | ORAL_TABLET | Freq: Every day | ORAL | Status: DC
  Administered 2012-07-23: 150 mg via ORAL
  Filled 2012-07-23 (×3): qty 2

## 2012-07-23 MED ORDER — ESTROGENS CONJUGATED 0.625 MG PO TABS
0.6250 mg | ORAL_TABLET | Freq: Every day | ORAL | Status: DC
  Administered 2012-07-25: 0.625 mg via ORAL
  Filled 2012-07-23 (×2): qty 1

## 2012-07-23 MED ORDER — OXYCODONE-ACETAMINOPHEN 5-325 MG PO TABS: 1.0000 | ORAL_TABLET | ORAL | Status: DC | PRN

## 2012-07-23 MED ORDER — SODIUM CHLORIDE 0.9 % IJ SOLN: 3.0000 mL | Freq: Two times a day (BID) | INTRAMUSCULAR | Status: DC

## 2012-07-23 MED ORDER — SENNOSIDES-DOCUSATE SODIUM 8.6-50 MG PO TABS: 1.0000 | ORAL_TABLET | Freq: Every evening | ORAL | Status: DC | PRN

## 2012-07-23 MED ORDER — SODIUM CHLORIDE 0.9 % IJ SOLN: 3.0000 mL | INTRAMUSCULAR | Status: DC | PRN

## 2012-07-23 MED ORDER — THROMBIN 20000 UNITS EX SOLR
CUTANEOUS | Status: DC | PRN
  Administered 2012-07-23: 19:00:00 via TOPICAL

## 2012-07-23 MED ORDER — ONDANSETRON HCL 4 MG/2ML IJ SOLN
INTRAMUSCULAR | Status: DC | PRN
  Administered 2012-07-23: 4 mg via INTRAVENOUS

## 2012-07-23 MED ORDER — MOMETASONE FURO-FORMOTEROL FUM 100-5 MCG/ACT IN AERO
2.0000 | INHALATION_SPRAY | Freq: Two times a day (BID) | RESPIRATORY_TRACT | Status: DC
  Administered 2012-07-23 – 2012-07-24 (×2): 2 via RESPIRATORY_TRACT
  Filled 2012-07-23: qty 8.8

## 2012-07-23 MED ORDER — VECURONIUM BROMIDE 10 MG IV SOLR
INTRAVENOUS | Status: DC | PRN
  Administered 2012-07-23: 2 mg via INTRAVENOUS
  Administered 2012-07-23 (×2): 1 mg via INTRAVENOUS
  Administered 2012-07-23: 2 mg via INTRAVENOUS
  Administered 2012-07-23: 1 mg via INTRAVENOUS

## 2012-07-23 MED ORDER — MIDAZOLAM HCL 2 MG/2ML IJ SOLN
INTRAMUSCULAR | Status: AC
  Administered 2012-07-23: 1 mg via INTRAVENOUS
  Filled 2012-07-23: qty 2

## 2012-07-23 MED ORDER — ACETAMINOPHEN 650 MG RE SUPP: 650.0000 mg | RECTAL | Status: DC | PRN

## 2012-07-23 MED ORDER — MIDAZOLAM HCL 2 MG/2ML IJ SOLN
1.0000 mg | INTRAMUSCULAR | Status: DC | PRN
  Administered 2012-07-23: 1 mg via INTRAVENOUS

## 2012-07-23 MED ORDER — LACTATED RINGERS IV SOLN
INTRAVENOUS | Status: DC
  Administered 2012-07-23: 16:00:00 via INTRAVENOUS

## 2012-07-23 MED ORDER — FLEET ENEMA 7-19 GM/118ML RE ENEM: 1.0000 | ENEMA | Freq: Once | RECTAL | Status: AC | PRN

## 2012-07-23 MED ORDER — NEOSTIGMINE METHYLSULFATE 1 MG/ML IJ SOLN
INTRAMUSCULAR | Status: DC | PRN
  Administered 2012-07-23: 4 mg via INTRAVENOUS

## 2012-07-23 MED ORDER — DIAZEPAM 5 MG PO TABS: 5.0000 mg | ORAL_TABLET | Freq: Four times a day (QID) | ORAL | Status: DC | PRN

## 2012-07-23 MED ORDER — BUPIVACAINE-EPINEPHRINE 0.25% -1:200000 IJ SOLN
INTRAMUSCULAR | Status: DC | PRN
  Administered 2012-07-23: 1.5 mL

## 2012-07-23 MED ORDER — EPHEDRINE SULFATE 50 MG/ML IJ SOLN
INTRAMUSCULAR | Status: DC | PRN
  Administered 2012-07-23 (×2): 10 mg via INTRAVENOUS

## 2012-07-23 MED ORDER — SODIUM CHLORIDE 0.9 % IV SOLN: 250.0000 mL | INTRAVENOUS | Status: DC

## 2012-07-23 MED ORDER — CEFAZOLIN SODIUM 1-5 GM-% IV SOLN
1.0000 g | Freq: Three times a day (TID) | INTRAVENOUS | Status: DC
  Administered 2012-07-24: 1 g via INTRAVENOUS
  Filled 2012-07-23 (×2): qty 50

## 2012-07-23 MED ORDER — PROPOFOL 10 MG/ML IV BOLUS
INTRAVENOUS | Status: DC | PRN
  Administered 2012-07-23: 160 mg via INTRAVENOUS

## 2012-07-23 MED ORDER — LEVOTHYROXINE SODIUM 150 MCG PO TABS
150.0000 ug | ORAL_TABLET | Freq: Every day | ORAL | Status: DC
  Administered 2012-07-25: 150 ug via ORAL
  Filled 2012-07-23 (×3): qty 1

## 2012-07-23 MED ORDER — MENTHOL 3 MG MT LOZG: 1.0000 | LOZENGE | OROMUCOSAL | Status: DC | PRN

## 2012-07-23 MED ORDER — ROCURONIUM BROMIDE 100 MG/10ML IV SOLN
INTRAVENOUS | Status: DC | PRN
  Administered 2012-07-23: 50 mg via INTRAVENOUS

## 2012-07-23 MED ORDER — HYDROCODONE-ACETAMINOPHEN 5-325 MG PO TABS
1.0000 | ORAL_TABLET | ORAL | Status: DC | PRN
  Filled 2012-07-23: qty 2

## 2012-07-23 MED ORDER — HYDROMORPHONE HCL PF 1 MG/ML IJ SOLN
INTRAMUSCULAR | Status: AC
  Administered 2012-07-23: 0.25 mg via INTRAVENOUS
  Filled 2012-07-23: qty 1

## 2012-07-23 MED ORDER — FENTANYL CITRATE 0.05 MG/ML IJ SOLN
INTRAMUSCULAR | Status: DC | PRN
  Administered 2012-07-23 (×2): 50 ug via INTRAVENOUS
  Administered 2012-07-23: 75 ug via INTRAVENOUS
  Administered 2012-07-23: 50 ug via INTRAVENOUS
  Administered 2012-07-23: 75 ug via INTRAVENOUS
  Administered 2012-07-23 (×2): 50 ug via INTRAVENOUS

## 2012-07-23 MED ORDER — DIPHENHYDRAMINE HCL 50 MG/ML IJ SOLN: 25.0000 mg | Freq: Four times a day (QID) | INTRAMUSCULAR | Status: DC | PRN

## 2012-07-23 MED ORDER — HYDROMORPHONE HCL PF 1 MG/ML IJ SOLN
0.2500 mg | INTRAMUSCULAR | Status: DC | PRN
  Administered 2012-07-23: 0.25 mg via INTRAVENOUS

## 2012-07-23 MED ORDER — MIDAZOLAM HCL 5 MG/5ML IJ SOLN
INTRAMUSCULAR | Status: DC | PRN
  Administered 2012-07-23: 2 mg via INTRAVENOUS

## 2012-07-23 MED ORDER — PHENYLEPHRINE HCL 10 MG/ML IJ SOLN
10.0000 mg | INTRAVENOUS | Status: DC | PRN
  Administered 2012-07-23: 50 ug/min via INTRAVENOUS

## 2012-07-23 MED ORDER — BUPIVACAINE-EPINEPHRINE PF 0.25-1:200000 % IJ SOLN
INTRAMUSCULAR | Status: AC
  Filled 2012-07-23: qty 30

## 2012-07-23 MED ORDER — AMLODIPINE BESYLATE 10 MG PO TABS
10.0000 mg | ORAL_TABLET | Freq: Every day | ORAL | Status: DC
  Administered 2012-07-24 – 2012-07-25 (×2): 10 mg via ORAL
  Filled 2012-07-23 (×2): qty 1

## 2012-07-23 MED ORDER — PROMETHAZINE HCL 25 MG/ML IJ SOLN: 6.2500 mg | INTRAMUSCULAR | Status: DC | PRN

## 2012-07-23 MED ORDER — DIPHENHYDRAMINE HCL 25 MG PO CAPS: 25.0000 mg | ORAL_CAPSULE | Freq: Four times a day (QID) | ORAL | Status: DC | PRN

## 2012-07-23 MED ORDER — POVIDONE-IODINE 7.5 % EX SOLN: Freq: Once | CUTANEOUS | Status: DC

## 2012-07-23 SURGICAL SUPPLY — 74 items
APL SKNCLS STERI-STRIP NONHPOA (GAUZE/BANDAGES/DRESSINGS) ×1
BENZOIN TINCTURE PRP APPL 2/3 (GAUZE/BANDAGES/DRESSINGS) ×2 IMPLANT
BIT DRILL NEURO 2X3.1 SFT TUCH (MISCELLANEOUS) ×1 IMPLANT
BIT DRILL SKYLINE 12MM (BIT) IMPLANT
BLADE SURG 15 STRL LF DISP TIS (BLADE) ×1 IMPLANT
BLADE SURG 15 STRL SS (BLADE) ×2
BLADE SURG ROTATE 9660 (MISCELLANEOUS) ×2 IMPLANT
BUR MATCHSTICK NEURO 3.0 LAGG (BURR) IMPLANT
CARTRIDGE OIL MAESTRO DRILL (MISCELLANEOUS) ×1 IMPLANT
CLOTH BEACON ORANGE TIMEOUT ST (SAFETY) ×2 IMPLANT
CLSR STERI-STRIP ANTIMIC 1/2X4 (GAUZE/BANDAGES/DRESSINGS) ×1 IMPLANT
CORDS BIPOLAR (ELECTRODE) ×2 IMPLANT
COVER SURGICAL LIGHT HANDLE (MISCELLANEOUS) ×2 IMPLANT
CRADLE DONUT ADULT HEAD (MISCELLANEOUS) ×2 IMPLANT
DEVICE ENDSKLTN CRVCL 5MM-0MED (Orthopedic Implant) IMPLANT
DEVICE ENDSKLTN CRVCL 5MM-0SM (Orthopedic Implant) IMPLANT
DIFFUSER DRILL AIR PNEUMATIC (MISCELLANEOUS) ×2 IMPLANT
DRAIN JACKSON RD 7FR 3/32 (WOUND CARE) IMPLANT
DRAPE C-ARM 42X72 X-RAY (DRAPES) ×2 IMPLANT
DRAPE POUCH INSTRU U-SHP 10X18 (DRAPES) ×2 IMPLANT
DRAPE SURG 17X23 STRL (DRAPES) ×6 IMPLANT
DRILL BIT SKYLINE 12MM (BIT) ×2
DRILL NEURO 2X3.1 SOFT TOUCH (MISCELLANEOUS) ×4
DURAPREP 26ML APPLICATOR (WOUND CARE) ×2 IMPLANT
ELECT COATED BLADE 2.86 ST (ELECTRODE) ×2 IMPLANT
ELECT REM PT RETURN 9FT ADLT (ELECTROSURGICAL) ×2
ELECTRODE REM PT RTRN 9FT ADLT (ELECTROSURGICAL) ×1 IMPLANT
ENDOSKELETON CERVICAL 5MM-0MED (Orthopedic Implant) ×4 IMPLANT
ENDOSKELETON CERVICAL 5MM-0SM (Orthopedic Implant) ×2 IMPLANT
EVACUATOR SILICONE 100CC (DRAIN) IMPLANT
GAUZE SPONGE 4X4 16PLY XRAY LF (GAUZE/BANDAGES/DRESSINGS) ×2 IMPLANT
GLOVE BIO SURGEON STRL SZ7 (GLOVE) ×2 IMPLANT
GLOVE BIO SURGEON STRL SZ8 (GLOVE) ×2 IMPLANT
GLOVE BIOGEL PI IND STRL 7.0 (GLOVE) ×2 IMPLANT
GLOVE BIOGEL PI IND STRL 8 (GLOVE) ×1 IMPLANT
GLOVE BIOGEL PI INDICATOR 7.0 (GLOVE) ×2
GLOVE BIOGEL PI INDICATOR 8 (GLOVE) ×1
GOWN STRL NON-REIN LRG LVL3 (GOWN DISPOSABLE) ×2 IMPLANT
GOWN STRL REIN XL XLG (GOWN DISPOSABLE) ×2 IMPLANT
IV CATH 14GX2 1/4 (CATHETERS) ×2 IMPLANT
KIT BASIN OR (CUSTOM PROCEDURE TRAY) ×2 IMPLANT
KIT ROOM TURNOVER OR (KITS) ×2 IMPLANT
MANIFOLD NEPTUNE II (INSTRUMENTS) ×2 IMPLANT
NDL SPNL 20GX3.5 QUINCKE YW (NEEDLE) ×1 IMPLANT
NEEDLE 27GAX1X1/2 (NEEDLE) ×2 IMPLANT
NEEDLE SPNL 20GX3.5 QUINCKE YW (NEEDLE) ×2 IMPLANT
NS IRRIG 1000ML POUR BTL (IV SOLUTION) ×2 IMPLANT
OIL CARTRIDGE MAESTRO DRILL (MISCELLANEOUS) ×2
PACK ORTHO CERVICAL (CUSTOM PROCEDURE TRAY) ×2 IMPLANT
PAD ARMBOARD 7.5X6 YLW CONV (MISCELLANEOUS) ×4 IMPLANT
PATTIES SURGICAL .5 X.5 (GAUZE/BANDAGES/DRESSINGS) IMPLANT
PATTIES SURGICAL .5 X1 (DISPOSABLE) IMPLANT
PIN DISTRACTION 14 (PIN) ×2 IMPLANT
PIN TEMP SKYLINE THREADED (PIN) ×2 IMPLANT
PLATE SKYLINE 3LVL 45MM CERV (Plate) ×1 IMPLANT
PUTTY BONE DBX 5CC MIX (Putty) ×1 IMPLANT
SCREW VAR SELF TAP SKYLINE 14M (Screw) ×8 IMPLANT
SPONGE GAUZE 4X4 12PLY (GAUZE/BANDAGES/DRESSINGS) ×2 IMPLANT
SPONGE INTESTINAL PEANUT (DISPOSABLE) ×4 IMPLANT
SPONGE SURGIFOAM ABS GEL 100 (HEMOSTASIS) ×2 IMPLANT
STRIP CLOSURE SKIN 1/2X4 (GAUZE/BANDAGES/DRESSINGS) ×2 IMPLANT
SURGIFLO TRUKIT (HEMOSTASIS) IMPLANT
SUT MNCRL AB 4-0 PS2 18 (SUTURE) IMPLANT
SUT VIC AB 2-0 CT2 18 VCP726D (SUTURE) ×2 IMPLANT
SYR BULB IRRIGATION 50ML (SYRINGE) ×2 IMPLANT
SYR CONTROL 10ML LL (SYRINGE) ×4 IMPLANT
TAPE CLOTH 4X10 WHT NS (GAUZE/BANDAGES/DRESSINGS) ×2 IMPLANT
TAPE CLOTH SURG 4X10 WHT LF (GAUZE/BANDAGES/DRESSINGS) ×1 IMPLANT
TAPE UMBILICAL COTTON 1/8X30 (MISCELLANEOUS) ×3 IMPLANT
TOWEL OR 17X24 6PK STRL BLUE (TOWEL DISPOSABLE) ×2 IMPLANT
TOWEL OR 17X26 10 PK STRL BLUE (TOWEL DISPOSABLE) ×2 IMPLANT
TRAY FOLEY CATH 14FR (SET/KITS/TRAYS/PACK) ×2 IMPLANT
WATER STERILE IRR 1000ML POUR (IV SOLUTION) ×2 IMPLANT
YANKAUER SUCT BULB TIP NO VENT (SUCTIONS) ×2 IMPLANT

## 2012-07-23 NOTE — Anesthesia Postprocedure Evaluation (Signed)
Anesthesia Post Note  Patient: [name]  Procedure(s) Performed: Procedure(s) (LRB): ANTERIOR CERVICAL DECOMPRESSION/DISCECTOMY FUSION 3 LEVELS (N/A)  Anesthesia type: general  Patient location: PACU  Post pain: Pain level controlled  Post assessment: Patient's Cardiovascular Status Stable  Last Vitals:  Filed Vitals:   07/23/12 2130  BP: 144/78  Pulse: 100  Temp:   Resp: 17    Post vital signs: Reviewed and stable  Level of consciousness: sedated  Complications: No apparent anesthesia complications

## 2012-07-23 NOTE — Preoperative (Signed)
Beta Blockers   Reason not to administer Beta Blockers:Not Applicable 

## 2012-07-23 NOTE — Anesthesia Preprocedure Evaluation (Signed)
Anesthesia Evaluation  Patient identified by MRN, date of birth, ID band Patient awake    Airway Mallampati: I  Neck ROM: Full    Dental  (+) Edentulous Upper   Pulmonary asthma ,  breath sounds clear to auscultation        Cardiovascular hypertension, + Peripheral Vascular Disease Rhythm:Regular Rate:Normal     Neuro/Psych    GI/Hepatic GERD-  ,  Endo/Other  Hypothyroidism   Renal/GU      Musculoskeletal   Abdominal   Peds  Hematology   Anesthesia Other Findings   Reproductive/Obstetrics                           Anesthesia Physical Anesthesia Plan  ASA: II  Anesthesia Plan: General   Post-op Pain Management:    Induction: Intravenous  Airway Management Planned: Oral ETT  Additional Equipment:   Intra-op Plan:   Post-operative Plan: Extubation in OR  Informed Consent: I have reviewed the patients History and Physical, chart, labs and discussed the procedure including the risks, benefits and alternatives for the proposed anesthesia with the patient or authorized representative who has indicated his/her understanding and acceptance.   Dental advisory given  Plan Discussed with: CRNA and Surgeon  Anesthesia Plan Comments:         Anesthesia Quick Evaluation

## 2012-07-23 NOTE — H&P (Signed)
PREOPERATIVE H&P  Chief Complaint: Left arm pain  HPI: [name] is a 67 y.o. female who presents with severe left arm pain  Past Medical History  Diagnosis Date  . Peripheral vascular disease   . Fibromyalgia   . Hypertension   . Hyperlipidemia   . Post-menopausal     on HRT x 25 years  . Hypothyroidism   . GERD (gastroesophageal reflux disease)   . Asthma   . Environmental allergies    Past Surgical History  Procedure Laterality Date  . Abdominal hysterectomy    . Femoral artery stent Bilateral 2011   History   Social History  . Marital Status: Married    Spouse Name: N/A    Number of Children: N/A  . Years of Education: N/A   Social History Main Topics  . Smoking status: Current Some Day Smoker -- 0.25 packs/day for 30 years    Types: Cigarettes  . Smokeless tobacco: Never Used  . Alcohol Use: None  . Drug Use: No  . Sexually Active: None   Other Topics Concern  . None   Social History Narrative  . None   History reviewed. No pertinent family history. Allergies  Allergen Reactions  . Codeine Itching  . Pine     Seasonal allergy   Prior to Admission medications   Medication Sig Start Date End Date Taking? Authorizing Provider  amitriptyline (ELAVIL) 150 MG tablet Take 150 mg by mouth at bedtime.   Yes Historical Provider, MD  amLODipine (NORVASC) 10 MG tablet Take 10 mg by mouth daily.   Yes Historical Provider, MD  aspirin 81 MG tablet Take 81 mg by mouth daily.   Yes Historical Provider, MD  atorvastatin (LIPITOR) 20 MG tablet Take 20 mg by mouth at bedtime.   Yes Historical Provider, MD  clopidogrel (PLAVIX) 75 MG tablet Take 75 mg by mouth daily.   Yes Historical Provider, MD  estrogens, conjugated, (PREMARIN) 0.625 MG tablet Take 0.625 mg by mouth daily. Take daily for 21 days then do not take for 7 days.   Yes Historical Provider, MD  Fluticasone-Salmeterol (ADVAIR) 100-50 MCG/DOSE AEPB Inhale 1 puff into the lungs every 12 (twelve) hours.    Yes Historical Provider, MD  levothyroxine (SYNTHROID, LEVOTHROID) 150 MCG tablet Take 150 mcg by mouth daily.   Yes Historical Provider, MD  losartan (COZAAR) 50 MG tablet Take 50 mg by mouth daily.   Yes Historical Provider, MD  pantoprazole (PROTONIX) 40 MG tablet Take 40 mg by mouth daily.   Yes Historical Provider, MD     All other systems have been reviewed and were otherwise negative with the exception of those mentioned in the HPI and as above.  Physical Exam: Filed Vitals:   07/23/12 1112  BP: 131/79  Pulse: 79  Temp: 97.3 F (36.3 C)  Resp: 20    General: Alert, no acute distress Cardiovascular: No pedal edema Respiratory: No cyanosis, no use of accessory musculature GI: No organomegaly, abdomen is soft and non-tender Skin: No lesions in the area of chief complaint Neurologic: Sensation intact distally Psychiatric: Patient is competent for consent with normal mood and affect Lymphatic: No axillary or cervical lymphadenopathy  MUSCULOSKELETAL: + spurling's on left  Assessment/Plan: Severe left arm pain  Plan for Procedure(s): ANTERIOR CERVICAL DECOMPRESSION/DISCECTOMY FUSION 3 LEVELS, POSTERIOR CERVICAL DECOMPRESSION AND FUSION C3-T2   Emilee Hero, MD 07/23/2012 3:42 PM

## 2012-07-23 NOTE — Transfer of Care (Signed)
Immediate Anesthesia Transfer of Care Note  Patient: HAZLEIGH MCCLEAVE  Procedure(s) Performed: Procedure(s) with comments: ANTERIOR CERVICAL DECOMPRESSION/DISCECTOMY FUSION 3 LEVELS (N/A) - Anterior cervical decompression fusion, cervical 3-4, cervical 4-5, cervical 5-6 with instrumentation and allograft.  Patient Location: PACU  Anesthesia Type:General  Level of Consciousness: awake, alert  and oriented  Airway & Oxygen Therapy: Patient Spontanous Breathing and Patient connected to nasal cannula oxygen  Post-op Assessment: Report given to PACU RN, Post -op Vital signs reviewed and stable and Patient moving all extremities  Post vital signs: Reviewed and stable  Complications: No apparent anesthesia complications

## 2012-07-23 NOTE — Anesthesia Procedure Notes (Signed)
Procedure Name: Intubation Date/Time: 07/23/2012 5:06 PM Performed by: Orvilla Fus A Pre-anesthesia Checklist: Patient identified, Timeout performed, Emergency Drugs available, Suction available and Patient being monitored Patient Re-evaluated:Patient Re-evaluated prior to inductionOxygen Delivery Method: Circle system utilized Preoxygenation: Pre-oxygenation with 100% oxygen Intubation Type: IV induction Ventilation: Mask ventilation without difficulty Grade View: Grade I Tube type: Oral Tube size: 7.0 mm Number of attempts: 1 Airway Equipment and Method: Rigid stylet,  Video-laryngoscopy and LTA kit utilized (Glidescope due to decreased ROM and pain in neck) Placement Confirmation: ETT inserted through vocal cords under direct vision,  breath sounds checked- equal and bilateral and positive ETCO2 Secured at: 21 cm Tube secured with: Tape Dental Injury: Teeth and Oropharynx as per pre-operative assessment

## 2012-07-24 ENCOUNTER — Encounter (HOSPITAL_COMMUNITY): Payer: Self-pay | Admitting: Anesthesiology

## 2012-07-24 ENCOUNTER — Inpatient Hospital Stay (HOSPITAL_COMMUNITY): Admission: RE | Admit: 2012-07-24 | Payer: Medicare Other | Source: Ambulatory Visit | Admitting: Orthopedic Surgery

## 2012-07-24 ENCOUNTER — Inpatient Hospital Stay (HOSPITAL_COMMUNITY): Payer: Medicare Other | Admitting: Anesthesiology

## 2012-07-24 ENCOUNTER — Encounter (HOSPITAL_COMMUNITY)
Admission: RE | Disposition: A | Discharge status: Home / Self Care | Payer: Self-pay | Source: Ambulatory Visit | Attending: Orthopedic Surgery

## 2012-07-24 ENCOUNTER — Inpatient Hospital Stay (HOSPITAL_COMMUNITY): Payer: Medicare Other

## 2012-07-24 HISTORY — PX: POSTERIOR CERVICAL FUSION/FORAMINOTOMY: SHX5038

## 2012-07-24 HISTORY — PX: POSTERIOR FUSION CERVICAL SPINE: SUR628

## 2012-07-24 SURGERY — POSTERIOR CERVICAL FUSION/FORAMINOTOMY LEVEL 5
Anesthesia: General | Site: Neck | Wound class: Clean

## 2012-07-24 MED ORDER — PHENYLEPHRINE HCL 10 MG/ML IJ SOLN
INTRAMUSCULAR | Status: DC | PRN
  Administered 2012-07-24 (×2): 120 ug via INTRAVENOUS

## 2012-07-24 MED ORDER — DIAZEPAM 5 MG PO TABS: 5.0000 mg | ORAL_TABLET | Freq: Four times a day (QID) | ORAL | Status: DC | PRN

## 2012-07-24 MED ORDER — PHENYLEPHRINE HCL 10 MG/ML IJ SOLN
10.0000 mg | INTRAVENOUS | Status: DC | PRN
  Administered 2012-07-24: 20 ug/min via INTRAVENOUS

## 2012-07-24 MED ORDER — PHENOL 1.4 % MT LIQD
1.0000 | OROMUCOSAL | Status: DC | PRN
  Filled 2012-07-24: qty 177

## 2012-07-24 MED ORDER — OXYCODONE HCL 5 MG/5ML PO SOLN: 5.0000 mg | Freq: Once | ORAL | Status: AC | PRN

## 2012-07-24 MED ORDER — GLYCOPYRROLATE 0.2 MG/ML IJ SOLN
INTRAMUSCULAR | Status: DC | PRN
  Administered 2012-07-24: 0.6 mg via INTRAVENOUS

## 2012-07-24 MED ORDER — ARTIFICIAL TEARS OP OINT
TOPICAL_OINTMENT | OPHTHALMIC | Status: DC | PRN
  Administered 2012-07-24: 1 via OPHTHALMIC

## 2012-07-24 MED ORDER — OXYCODONE HCL 5 MG PO TABS: 5.0000 mg | ORAL_TABLET | Freq: Once | ORAL | Status: AC | PRN

## 2012-07-24 MED ORDER — MIDAZOLAM HCL 5 MG/5ML IJ SOLN
INTRAMUSCULAR | Status: DC | PRN
  Administered 2012-07-24 (×2): 1 mg via INTRAVENOUS

## 2012-07-24 MED ORDER — ACETAMINOPHEN 650 MG RE SUPP: 650.0000 mg | RECTAL | Status: DC | PRN

## 2012-07-24 MED ORDER — EPHEDRINE SULFATE 50 MG/ML IJ SOLN
INTRAMUSCULAR | Status: DC | PRN
  Administered 2012-07-24: 5 mg via INTRAVENOUS

## 2012-07-24 MED ORDER — LACTATED RINGERS IV SOLN
INTRAVENOUS | Status: DC | PRN
  Administered 2012-07-24: 07:00:00 via INTRAVENOUS

## 2012-07-24 MED ORDER — ACETAMINOPHEN 10 MG/ML IV SOLN
INTRAVENOUS | Status: AC
  Administered 2012-07-24: 1000 mg via INTRAVENOUS
  Filled 2012-07-24: qty 100

## 2012-07-24 MED ORDER — MORPHINE SULFATE 2 MG/ML IJ SOLN
1.0000 mg | INTRAMUSCULAR | Status: DC | PRN
  Administered 2012-07-24 – 2012-07-25 (×3): 2 mg via INTRAVENOUS
  Filled 2012-07-24 (×3): qty 1

## 2012-07-24 MED ORDER — BUPIVACAINE-EPINEPHRINE 0.25% -1:200000 IJ SOLN
INTRAMUSCULAR | Status: DC | PRN
  Administered 2012-07-24: 26 mL

## 2012-07-24 MED ORDER — THROMBIN 20000 UNITS EX SOLR
OROMUCOSAL | Status: DC | PRN
  Administered 2012-07-24: 09:00:00 via TOPICAL

## 2012-07-24 MED ORDER — PROPOFOL 10 MG/ML IV BOLUS
INTRAVENOUS | Status: DC | PRN
  Administered 2012-07-24: 25 mg via INTRAVENOUS
  Administered 2012-07-24: 175 mg via INTRAVENOUS

## 2012-07-24 MED ORDER — CEFAZOLIN SODIUM-DEXTROSE 2-3 GM-% IV SOLR
2.0000 g | Freq: Once | INTRAVENOUS | Status: AC
  Administered 2012-07-24: 2 g via INTRAVENOUS

## 2012-07-24 MED ORDER — LACTATED RINGERS IV SOLN
INTRAVENOUS | Status: DC | PRN
  Administered 2012-07-24 (×2): via INTRAVENOUS

## 2012-07-24 MED ORDER — CEFAZOLIN SODIUM-DEXTROSE 2-3 GM-% IV SOLR
INTRAVENOUS | Status: AC
  Filled 2012-07-24: qty 50

## 2012-07-24 MED ORDER — ACETAMINOPHEN 325 MG PO TABS: 650.0000 mg | ORAL_TABLET | ORAL | Status: DC | PRN

## 2012-07-24 MED ORDER — SENNOSIDES-DOCUSATE SODIUM 8.6-50 MG PO TABS: 1.0000 | ORAL_TABLET | Freq: Every evening | ORAL | Status: DC | PRN

## 2012-07-24 MED ORDER — BACITRACIN ZINC 500 UNIT/GM EX OINT
TOPICAL_OINTMENT | CUTANEOUS | Status: DC | PRN
  Administered 2012-07-24: 1 via TOPICAL

## 2012-07-24 MED ORDER — SODIUM CHLORIDE 0.9 % IJ SOLN: 3.0000 mL | Freq: Two times a day (BID) | INTRAMUSCULAR | Status: DC

## 2012-07-24 MED ORDER — METOCLOPRAMIDE HCL 5 MG/ML IJ SOLN: 10.0000 mg | Freq: Once | INTRAMUSCULAR | Status: AC | PRN

## 2012-07-24 MED ORDER — HYDROMORPHONE HCL PF 1 MG/ML IJ SOLN
0.2500 mg | INTRAMUSCULAR | Status: DC | PRN
  Administered 2012-07-24 – 2012-07-25 (×6): 0.5 mg via INTRAVENOUS
  Filled 2012-07-24 (×2): qty 1

## 2012-07-24 MED ORDER — HYDROCODONE-ACETAMINOPHEN 5-325 MG PO TABS
1.0000 | ORAL_TABLET | ORAL | Status: DC | PRN
  Administered 2012-07-25: 2 via ORAL
  Filled 2012-07-24: qty 2

## 2012-07-24 MED ORDER — ONDANSETRON HCL 4 MG/2ML IJ SOLN
INTRAMUSCULAR | Status: DC | PRN
  Administered 2012-07-24: 4 mg via INTRAVENOUS

## 2012-07-24 MED ORDER — BISACODYL 5 MG PO TBEC: 5.0000 mg | DELAYED_RELEASE_TABLET | Freq: Every day | ORAL | Status: DC | PRN

## 2012-07-24 MED ORDER — DOCUSATE SODIUM 100 MG PO CAPS
100.0000 mg | ORAL_CAPSULE | Freq: Two times a day (BID) | ORAL | Status: DC
  Administered 2012-07-25: 100 mg via ORAL
  Filled 2012-07-24 (×2): qty 1

## 2012-07-24 MED ORDER — BUPIVACAINE-EPINEPHRINE PF 0.25-1:200000 % IJ SOLN
INTRAMUSCULAR | Status: AC
  Filled 2012-07-24: qty 30

## 2012-07-24 MED ORDER — FLEET ENEMA 7-19 GM/118ML RE ENEM: 1.0000 | ENEMA | Freq: Once | RECTAL | Status: AC | PRN

## 2012-07-24 MED ORDER — LIDOCAINE HCL (CARDIAC) 20 MG/ML IV SOLN
INTRAVENOUS | Status: DC | PRN
  Administered 2012-07-24: 80 mg via INTRAVENOUS

## 2012-07-24 MED ORDER — LIDOCAINE HCL 4 % MT SOLN
OROMUCOSAL | Status: DC | PRN
  Administered 2012-07-24: 4 mL via TOPICAL

## 2012-07-24 MED ORDER — OXYCODONE-ACETAMINOPHEN 5-325 MG PO TABS: 1.0000 | ORAL_TABLET | ORAL | Status: DC | PRN

## 2012-07-24 MED ORDER — ALUM & MAG HYDROXIDE-SIMETH 200-200-20 MG/5ML PO SUSP: 30.0000 mL | Freq: Four times a day (QID) | ORAL | Status: DC | PRN

## 2012-07-24 MED ORDER — 0.9 % SODIUM CHLORIDE (POUR BTL) OPTIME
TOPICAL | Status: DC | PRN
  Administered 2012-07-24: 2000 mL

## 2012-07-24 MED ORDER — ROCURONIUM BROMIDE 100 MG/10ML IV SOLN
INTRAVENOUS | Status: DC | PRN
  Administered 2012-07-24: 50 mg via INTRAVENOUS
  Administered 2012-07-24: 20 mg via INTRAVENOUS
  Administered 2012-07-24: 10 mg via INTRAVENOUS
  Administered 2012-07-24: 20 mg via INTRAVENOUS
  Administered 2012-07-24 (×2): 10 mg via INTRAVENOUS
  Administered 2012-07-24: 20 mg via INTRAVENOUS
  Administered 2012-07-24 (×3): 10 mg via INTRAVENOUS

## 2012-07-24 MED ORDER — MENTHOL 3 MG MT LOZG
1.0000 | LOZENGE | OROMUCOSAL | Status: DC | PRN
  Filled 2012-07-24: qty 9

## 2012-07-24 MED ORDER — DOUBLE ANTIBIOTIC 500-10000 UNIT/GM EX OINT
TOPICAL_OINTMENT | CUTANEOUS | Status: AC
  Filled 2012-07-24: qty 1

## 2012-07-24 MED ORDER — ACETAMINOPHEN 10 MG/ML IV SOLN
1000.0000 mg | Freq: Once | INTRAVENOUS | Status: AC
  Administered 2012-07-24: 1000 mg via INTRAVENOUS
  Filled 2012-07-24: qty 100

## 2012-07-24 MED ORDER — NEOSTIGMINE METHYLSULFATE 1 MG/ML IJ SOLN
INTRAMUSCULAR | Status: DC | PRN
  Administered 2012-07-24: 4 mg via INTRAVENOUS

## 2012-07-24 MED ORDER — THROMBIN 20000 UNITS EX SOLR
CUTANEOUS | Status: AC
  Filled 2012-07-24: qty 40000

## 2012-07-24 MED ORDER — ZOLPIDEM TARTRATE 5 MG PO TABS: 5.0000 mg | ORAL_TABLET | Freq: Every evening | ORAL | Status: DC | PRN

## 2012-07-24 MED ORDER — SODIUM CHLORIDE 0.9 % IV SOLN: 250.0000 mL | INTRAVENOUS | Status: DC

## 2012-07-24 MED ORDER — CEFAZOLIN SODIUM 1-5 GM-% IV SOLN
1.0000 g | Freq: Three times a day (TID) | INTRAVENOUS | Status: AC
  Administered 2012-07-24 – 2012-07-25 (×2): 1 g via INTRAVENOUS
  Filled 2012-07-24 (×2): qty 50

## 2012-07-24 MED ORDER — FENTANYL CITRATE 0.05 MG/ML IJ SOLN
INTRAMUSCULAR | Status: DC | PRN
  Administered 2012-07-24 (×4): 50 ug via INTRAVENOUS
  Administered 2012-07-24: 25 ug via INTRAVENOUS
  Administered 2012-07-24: 50 ug via INTRAVENOUS
  Administered 2012-07-24: 25 ug via INTRAVENOUS
  Administered 2012-07-24: 100 ug via INTRAVENOUS

## 2012-07-24 MED ORDER — SODIUM CHLORIDE 0.9 % IJ SOLN: 3.0000 mL | INTRAMUSCULAR | Status: DC | PRN

## 2012-07-24 MED ORDER — MINERAL OIL LIGHT 100 % EX OIL
TOPICAL_OIL | CUTANEOUS | Status: AC
  Filled 2012-07-24: qty 25

## 2012-07-24 MED ORDER — ONDANSETRON HCL 4 MG/2ML IJ SOLN: 4.0000 mg | INTRAMUSCULAR | Status: DC | PRN

## 2012-07-24 SURGICAL SUPPLY — 79 items
APL SKNCLS STERI-STRIP NONHPOA (GAUZE/BANDAGES/DRESSINGS) ×1
BENZOIN TINCTURE PRP APPL 2/3 (GAUZE/BANDAGES/DRESSINGS) ×3 IMPLANT
BIT DRILL MOUNTAINEER FIX 14 (BIT) ×1
BIT DRILL MOUNTAINEER FIX 14MM (BIT) IMPLANT
BLADE CLIPPER SURG NEURO (BLADE) IMPLANT
BUR NEURO DRILL SOFT 3.0X3.8M (BURR) ×2 IMPLANT
BUR PRESCISION 1.7 ELITE (BURR) ×2 IMPLANT
CLOTH BEACON ORANGE TIMEOUT ST (SAFETY) ×2 IMPLANT
CLSR STERI-STRIP ANTIMIC 1/2X4 (GAUZE/BANDAGES/DRESSINGS) ×1 IMPLANT
CONT SPEC STER OR (MISCELLANEOUS) ×2 IMPLANT
CORDS BIPOLAR (ELECTRODE) ×2 IMPLANT
COVER SURGICAL LIGHT HANDLE (MISCELLANEOUS) ×2 IMPLANT
DRAIN CHANNEL 10F 3/8 F FF (DRAIN) IMPLANT
DRAIN CHANNEL 15F RND FF W/TCR (WOUND CARE) IMPLANT
DRAIN HEMOVAC 1/8 X 5 (WOUND CARE) IMPLANT
DRAPE C-ARM 42X72 X-RAY (DRAPES) ×2 IMPLANT
DRAPE INCISE IOBAN 66X45 STRL (DRAPES) ×2 IMPLANT
DRAPE PED LAPAROTOMY (DRAPES) ×2 IMPLANT
DRAPE POUCH INSTRU U-SHP 10X18 (DRAPES) ×2 IMPLANT
DRAPE PROXIMA HALF (DRAPES) ×10 IMPLANT
DRAPE SURG 17X23 STRL (DRAPES) ×16 IMPLANT
DRAPE TABLE COVER HEAVY DUTY (DRAPES) ×2 IMPLANT
DRILL BIT MOUNTAINEER FIX 14MM (BIT) ×2
DRSG MEPILEX BORDER 4X8 (GAUZE/BANDAGES/DRESSINGS) ×2 IMPLANT
DURAPREP 26ML APPLICATOR (WOUND CARE) ×2 IMPLANT
ELECT BLADE 4.0 EZ CLEAN MEGAD (MISCELLANEOUS) ×2
ELECT CAUTERY BLADE 6.4 (BLADE) ×2 IMPLANT
ELECT REM PT RETURN 9FT ADLT (ELECTROSURGICAL) ×2
ELECTRODE BLDE 4.0 EZ CLN MEGD (MISCELLANEOUS) ×1 IMPLANT
ELECTRODE REM PT RTRN 9FT ADLT (ELECTROSURGICAL) ×1 IMPLANT
EVACUATOR SILICONE 100CC (DRAIN) ×2 IMPLANT
GAUZE SPONGE 4X4 16PLY XRAY LF (GAUZE/BANDAGES/DRESSINGS) ×8 IMPLANT
GLOVE BIO SURGEON STRL SZ7 (GLOVE) ×3 IMPLANT
GLOVE BIO SURGEON STRL SZ8 (GLOVE) ×2 IMPLANT
GLOVE BIOGEL PI IND STRL 8 (GLOVE) ×1 IMPLANT
GLOVE BIOGEL PI INDICATOR 8 (GLOVE) ×1
GOWN STRL NON-REIN LRG LVL3 (GOWN DISPOSABLE) ×8 IMPLANT
GOWN STRL REIN XL XLG (GOWN DISPOSABLE) ×2 IMPLANT
IV CATH 14GX2 1/4 (CATHETERS) ×2 IMPLANT
KIT BASIN OR (CUSTOM PROCEDURE TRAY) ×2 IMPLANT
KIT ROOM TURNOVER OR (KITS) ×2 IMPLANT
MIX DBX 10CC 35% BONE (Bone Implant) ×2 IMPLANT
NDL HYPO 25GX1X1/2 BEV (NEEDLE) ×1 IMPLANT
NEEDLE 22X1 1/2 (OR ONLY) (NEEDLE) ×1 IMPLANT
NEEDLE 27GAX1X1/2 (NEEDLE) ×2 IMPLANT
NEEDLE HYPO 25GX1X1/2 BEV (NEEDLE) ×2 IMPLANT
NS IRRIG 1000ML POUR BTL (IV SOLUTION) ×2 IMPLANT
PACK LAMINECTOMY ORTHO (CUSTOM PROCEDURE TRAY) ×2 IMPLANT
PAD ARMBOARD 7.5X6 YLW CONV (MISCELLANEOUS) ×4 IMPLANT
PATTIES SURGICAL .5 X.5 (GAUZE/BANDAGES/DRESSINGS) ×2 IMPLANT
PATTIES SURGICAL .5 X3 (DISPOSABLE) IMPLANT
PATTIES SURGICAL .5X1.5 (GAUZE/BANDAGES/DRESSINGS) ×2 IMPLANT
PIN MAYFIELD SKULL DISP (PIN) ×2 IMPLANT
ROD MOUNTAINEER 120MM (Rod) ×2 IMPLANT
SCREW F A 3.5X14 (Screw) ×4 IMPLANT
SCREW INNER (Screw) ×8 IMPLANT
SCREW MOUNTAINEER M/L 4.35X25 (Screw) ×2 IMPLANT
SCREW MTN POLY 4.0X22MM (Screw) ×2 IMPLANT
SPONGE GAUZE 4X4 12PLY (GAUZE/BANDAGES/DRESSINGS) ×2 IMPLANT
SPONGE INTESTINAL PEANUT (DISPOSABLE) ×1 IMPLANT
SPONGE SURGIFOAM ABS GEL 100 (HEMOSTASIS) ×2 IMPLANT
STRIP CLOSURE SKIN 1/2X4 (GAUZE/BANDAGES/DRESSINGS) ×1 IMPLANT
SURGIFLO TRUKIT (HEMOSTASIS) IMPLANT
SUT BONE WAX W31G (SUTURE) ×1 IMPLANT
SUT MNCRL AB 4-0 PS2 18 (SUTURE) ×2 IMPLANT
SUT VIC AB 0 CT1 18XCR BRD 8 (SUTURE) ×1 IMPLANT
SUT VIC AB 0 CT1 8-18 (SUTURE) ×2
SUT VIC AB 1 CT1 18XCR BRD 8 (SUTURE) ×2 IMPLANT
SUT VIC AB 1 CT1 8-18 (SUTURE) ×4
SUT VIC AB 2-0 CT2 18 VCP726D (SUTURE) ×2 IMPLANT
SYR BULB IRRIGATION 50ML (SYRINGE) ×2 IMPLANT
SYR CONTROL 10ML LL (SYRINGE) ×2 IMPLANT
TAPE CLOTH 4X10 WHT NS (GAUZE/BANDAGES/DRESSINGS) ×2 IMPLANT
TAPE CLOTH SURG 4X10 WHT LF (GAUZE/BANDAGES/DRESSINGS) ×1 IMPLANT
TOWEL OR 17X24 6PK STRL BLUE (TOWEL DISPOSABLE) ×2 IMPLANT
TOWEL OR 17X26 10 PK STRL BLUE (TOWEL DISPOSABLE) ×2 IMPLANT
TRAY FOLEY CATH 14FR (SET/KITS/TRAYS/PACK) ×2 IMPLANT
WATER STERILE IRR 1000ML POUR (IV SOLUTION) ×2 IMPLANT
YANKAUER SUCT BULB TIP NO VENT (SUCTIONS) ×2 IMPLANT

## 2012-07-24 NOTE — Anesthesia Procedure Notes (Signed)
Procedure Name: Intubation Date/Time: 07/24/2012 7:33 AM Performed by: Gayla Medicus Pre-anesthesia Checklist: Patient identified, Timeout performed, Emergency Drugs available, Suction available and Patient being monitored Patient Re-evaluated:Patient Re-evaluated prior to inductionOxygen Delivery Method: Circle system utilized Preoxygenation: Pre-oxygenation with 100% oxygen Intubation Type: IV induction Ventilation: Mask ventilation without difficulty Grade View: Grade I Tube type: Oral Tube size: 7.0 mm Number of attempts: 1 Airway Equipment and Method: Stylet and Video-laryngoscopy Placement Confirmation: ETT inserted through vocal cords under direct vision,  positive ETCO2 and breath sounds checked- equal and bilateral Secured at: 21 cm Tube secured with: Tape Dental Injury: Teeth and Oropharynx as per pre-operative assessment

## 2012-07-24 NOTE — Progress Notes (Signed)
Report receicved at this time

## 2012-07-24 NOTE — Progress Notes (Signed)
Patient evaluated in holding. Left arm pain better, C8 nerve pain remains, as expected. NVI  No changes to patient's medical history.  Will proceed with stage 2 this morning (C3-T2 posterior fusion with instrumentation and allograft, C6-T2 decompression)

## 2012-07-24 NOTE — Anesthesia Preprocedure Evaluation (Signed)
Anesthesia Evaluation  Patient identified by MRN, date of birth, ID band Patient awake    Reviewed: Allergy & Precautions, H&P , NPO status , Patient's Chart, lab work & pertinent test results, reviewed documented beta blocker date and time   Airway Mallampati: II TM Distance: >3 FB Neck ROM: full    Dental   Pulmonary asthma ,  breath sounds clear to auscultation        Cardiovascular hypertension, On Medications + Peripheral Vascular Disease Rhythm:regular     Neuro/Psych  Neuromuscular disease negative psych ROS   GI/Hepatic Neg liver ROS, GERD-  Medicated and Controlled,  Endo/Other  Hypothyroidism   Renal/GU negative Renal ROS  negative genitourinary   Musculoskeletal   Abdominal   Peds  Hematology negative hematology ROS (+)   Anesthesia Other Findings See surgeon's H&P   Reproductive/Obstetrics negative OB ROS                           Anesthesia Physical Anesthesia Plan  ASA: III  Anesthesia Plan: General   Post-op Pain Management:    Induction: Intravenous  Airway Management Planned: Oral ETT and Video Laryngoscope Planned  Additional Equipment:   Intra-op Plan:   Post-operative Plan: Extubation in OR  Informed Consent: I have reviewed the patients History and Physical, chart, labs and discussed the procedure including the risks, benefits and alternatives for the proposed anesthesia with the patient or authorized representative who has indicated his/her understanding and acceptance.   Dental Advisory Given  Plan Discussed with: CRNA and Surgeon  Anesthesia Plan Comments:         Anesthesia Quick Evaluation

## 2012-07-24 NOTE — Op Note (Signed)
[name], [name]                 ACCOUNT NO.:  [id number]  MEDICAL RECORD NO.:  [id number]  LOCATION:  [id number]                        FACILITY:  [facility]  PHYSICIAN:  Estill Bamberg, MD      DATE OF BIRTH:  [birth year]  DATE OF STAGE 1 PROCEDURE:  07/23/2012                              OPERATIVE REPORT   PREOPERATIVE DIAGNOSIS: 1. Left-sided cervical myelopathy. 2. Spinal cord compression. 3. Varying degrees of neural foraminal stenosis from C3-T1.  POSTOPERATIVE DIAGNOSIS: 1. Left-sided cervical myelopathy. 2. Spinal cord compression. 3. Varying degrees of neural foraminal stenosis from C3-T1.  PROCEDURE: 1. Anterior and cervical decompression and fusion C3-4, C4-5, C5-6. 2. Placement of anterior instrumentation C3-C6. 3. Insertion of interbody device x3 (5 mm Titan interbody cages). 4. Use of morselized allograft. 5. Intraoperative use of fluoroscopy.  SURGEON:  Estill Bamberg, MD  ASSISTANT:  Jason Coop, PA-C  ANESTHESIA:  General endotracheal anesthesia.  COMPLICATIONS:  None.  DISPOSITION:  Stable.  ESTIMATED BLOOD LOSS:  Minimal.  INDICATIONS FOR PROCEDURE:  Briefly, [name] is a very pleasant 67 year old right-hand-dominant female, who did initially present to me with severe and debilitating pain in her left arm.  I did review an MRI, which was clearly notable for both spinal cord compression.  In addition, varying degrees of foraminal stenosis at multiple levels expanding to C3 all way down the T2.  The patient did fail multiple forms of conservative care and we did have a discussion regarding operative intervention.  Of particular note, the patient had neuroforaminal stenosis involving a total of 6 levels on her neck from C3 down to the T2 level.  She also was noted to have spinal cord compression and rather kyphotic alignment.  We, therefore, did have a discussion regarding end-stage procedure.  This was specifically to involve C3-C6 anterior cervical  decompression and fusion on day #1. This was to remove the foraminal stenosis and spinal cord compression anteriorly.  This was to be followed by posterior fusion from C3-T2 in addition to a neural foraminal decompression bilaterally at C6-7 and on the left at C7-T1 and T1-T2 with the fusion down to T2.  The patient fully understood the risks and limitations of the procedure as outlined in my preoperative note.  OPERATIVE DETAILS:  On July 23, 2012, the patient was brought to surgery and general endotracheal anesthesia was administered.  The patient was placed supine on a well-padded hospital bed.  The neck was placed in a gentle degree of extension.  The neck was then prepped and draped in the usual sterile fashion.  Antibiotics were given and a time-out procedure was performed.  I then made a transverse incision from the midline to the medial border of the sternocleidomastoid muscle.  The platysma was sharply incised.  The anterior cervical spine was readily identified. There was prominent and rather severe osteophytes noted anteriorly.  I did obtain a lateral intraoperative fluoroscopic view to confirm the appropriate operative levels.  The vertebral body was then subperiosteally exposed from C3-C6.  The anterior osteophytes were removed.  I then turned my attention to the C5-6 level.  Caspar pins were placed and distraction was applied across the pins  and the self- retaining Shadow-Line retractor was also placed.  Under distraction, I did perform a thorough and complete diskectomy.  The posterior longitudinal ligament was entered and a thorough neural foraminal decompression was performed on the left side in addition to a central decompression.  I then prepared the endplates and placed a series of trials, and did feel that a 6 mm trial would be the most appropriate fit.  This was packed with DBX mix and tamped into position in the usual fashion under distraction.  A Caspar pin was  removed from the lower level and placed into the C4 vertebral body.  Distraction was again applied and a diskectomy was performed in the manner described previously.  Once again, I was able to obtain a thorough and complete diskectomy and a complete central decompression and a left-sided neural foraminal decompression.  Again, series of trials was placed and I did feel that the 5 mm trial would be the most appropriate fit.  The a 5 mm implant was packed with DBX mix and tamped into position in the usual fashion.  Again, a common distraction was discontinued and Caspar pin was removed from the lower vertebral body.  Bone wax was placed in its place.  This Caspar pin was then into the C3 vertebral body. Distraction was applied and as previously discussed, diskectomy was thoroughly performed and I did again confirm thorough neuroforaminal decompression both centrally and on the left side.  The endplates were again prepared and I placed a series of trials and I did ultimately place a small 5 mm trial into the intervertebral space.  I did note an excellent press fit of the each of the implants.  The Caspar pins were removed and bone wax was placed in their place.  I then chose an appropriately sized anterior cervical plate.  This was placed over the anterior cervical spine.  A total of 2 self-tapping screws were placed in each vertebral body for a total of 8 screws, from C3-C6.  I did note excellent purchase of the screws, the CAM locking mechanism was then utilized to ensure adequate locking of the screws.  I did use AP and lateral fluoroscopy while placing the anterior hardware and was very pleased with the appearance.  The wound was then explored for any undue bleeding.  There was some minimal bleeding, which was controlled using bipolar electrocautery.  The wound was then copiously irrigated.  The platysma was then closed using 2-0 Vicryl.  The skin was then closed using 3-0 Monocryl.   Benzoin and Steri-Strips were applied followed by sterile dressing.  All instrument counts were correct at the termination of the procedure.  A hard cervical collar was placed and then, the patient was awoken from general endotracheal anesthesia and transferred to recovery in stable condition.  Of note, Jason Coop was my assistant throughout the entirety of the procedure and aided in essential retraction and suctioning required throughout the surgery.     Estill Bamberg, MD     MD/MEDQ  D:  07/23/2012  T:  07/24/2012  Job:  [id number]  cc:   Massie Maroon, MD

## 2012-07-24 NOTE — Progress Notes (Signed)
UR COMPLETED  

## 2012-07-24 NOTE — Transfer of Care (Signed)
Immediate Anesthesia Transfer of Care Note  Patient: [name]  Procedure(s) Performed: Procedure(s) with comments: POSTERIOR CERVICAL FUSION/FORAMINOTOMY LEVEL 5 (N/A) - Posterior cervical decompression, cervical 6-7, C7-T1, T1-T2. Cervical fusion cervical 3-4, cervical 4-5, cervical 5-6, cervical 6-7, cervical 7-T1 with allograft, local autograft.  Patient Location: PACU  Anesthesia Type:General  Level of Consciousness: awake, alert  and oriented  Airway & Oxygen Therapy: Patient Spontanous Breathing and Patient connected to nasal cannula oxygen  Post-op Assessment: Report given to PACU RN and Post -op Vital signs reviewed and stable  Post vital signs: Reviewed and stable  Complications: No apparent anesthesia complications

## 2012-07-24 NOTE — Preoperative (Signed)
Beta Blockers   Reason not to administer Beta Blockers:Not Applicable 

## 2012-07-25 ENCOUNTER — Encounter (HOSPITAL_COMMUNITY): Payer: Self-pay | Admitting: General Practice

## 2012-07-25 MED FILL — Hydromorphone HCl Inj 1 MG/ML: INTRAMUSCULAR | Qty: 1 | Status: AC

## 2012-07-25 NOTE — Progress Notes (Signed)
Called to bedside by RN. Patient is anxious, sitting on edge of bed complaining of "something stuck in her throat." Patient appears to be having difficulty with ineffective cough. Recent anterior and posterior cervical surgery noted. RR 24, HR 114, 100% on RA. Breath sounds diminished bilaterally. Flutter valve administered to help mobilize secretions. Patient less anxious following pain med administration. RT will stand by following assessment by PA.

## 2012-07-25 NOTE — Progress Notes (Addendum)
PO Day #2 S/P C3-6 ACDF & PO Day #1 S/P Posterior Cervical Fusion C3-T2 with foraminotomies C6-7, C7-T1 Resolution of all radicular symptoms, C/O swallowing difficulties being substantial, pt unable to tolerate PO meds last night and this AM. Given Dilaudid this AM for px control, px improving. Denies difficulties breathing. Using spirometer appropriately. Tolerating liquids and ice in small quantities.  Pt eager to go home today.  BP 142/64  Pulse 106  Temp(Src) 99.9 F (37.7 C) (Oral)  Resp 16  Ht 5\' 7"  (1.702 m)  Wt 71.9 kg (158 lb 8.2 oz)  BMI 24.82 kg/m2  SpO2 95% Dressing C/D/I, posterior dressing reinforced last night. Hard collar fitting appropriately. Upon removal of hard collar neck soft/supple. No accessory muscle use for breathing, sat 100% on room air, stable at 95% on oxygen. Hoarseness of voice improved from yesterday.  PO Day #2 S/P C3-6 ACDF & PO Day #1 S/P Posterior Cervical Fusion C3-T2 with foraminotomies C6-7, C7-T1  -Px well controlled with dilaudid this AM   -Pt has not been able to take PO meds secondary to swallowing difficulties   -D/C home on percocet/valium, written scripts in chart  -Swallowing Difficulty   -Continuous pulse ox ordered, advised pt and staff to alert [place] immediately if any breathing difficulties   -Seen by RT this am, flutter valve performed to help mobilize secretions and pt feeling much better   -Cont attempts at PO meds/soft foods, advance diet as tolerated  -Pt eager to go home today   -Informed she can go home if able to tolerate PO meds and swallowing improves

## 2012-07-25 NOTE — Op Note (Signed)
[name], [name]                 ACCOUNT NO.:  [id number]  MEDICAL RECORD NO.:  [id number]  LOCATION:  5N08C                        FACILITY:  [facility]  PHYSICIAN:  Estill Bamberg, MD      DATE OF BIRTH:  August 10, 1945  DATE OF PROCEDURE:  07/24/2012                              OPERATIVE REPORT   PREOPERATIVE DIAGNOSES: 1. Severe left-sided cervical radiculopathy. 2. Status post C3-C6 anterior cervical discectomy and fusion pending a     posterior instrumentation, fusion, decompression from C3-T2. 3. Severe left-sided neural foraminal stenosis at C7-T1 in addition to     the T1-2.  POSTOPERATIVE DIAGNOSES: 1. Severe left-sided cervical radiculopathy. 2. Status post C3-C6 anterior cervical discectomy and fusion pending a     posterior instrumentation, fusion, decompression from C3-T2. 3. Severe left-sided neural foraminal stenosis at C7-T1 in addition to     the T1-2.  PROCEDURE: 1. Posterior cervical fusion, C3-T2. 2. Placement of posterior instrumentation, C3-T2. 3. Use of local autograft. 4. Use of morselized allograft. 5. Bilateral laminotomy, partial facetectomy, foraminotomy, C6-7. 6. Left-sided laminotomy, partial facetectomy and foraminotomy C7-T1,     T1-2. 7. Cranial tong application and removal. 8. Intraoperative use of fluoroscopy.  SURGEON:  Estill Bamberg, MD.  ASSISTANTJason Coop, PA-C  ANESTHESIA:  General endotracheal anesthesia.  COMPLICATIONS:  None.  DISPOSITION:  Stable.  ESTIMATED BLOOD LOSS:  250 mL.  INDICATIONS FOR PROCEDURE:  Briefly, Mr. Cavanah is a very pleasant 67 year old female, who I did evaluate in my office with signs and symptoms associated with severe left-sided cervical radiculopathy.  An MRI clearly was notable for spinal cord compression and varying degrees of neuroforaminal stenosis.  Please defer to the operative report of stage I of the procedure for additional details.  The decision was made preoperatively to go forward  with stage I of the procedure, which was done yesterday.  The plan was then to bring the patient back today for a posterior cervical decompression to remove additional severe neural foraminal stenosis resulting in radiculopathy, in addition to a posterior instrumented fusion from C3-T2.  The patient fully understood the risks and limitations of the procedure.  OPERATIVE DETAILS:  On July 24, 2012, the patient was brought to surgery and general endotracheal anesthesia was administered.  A cranial tong Mayfield head holder was applied.  The patient was rolled supine on a flat Jackson bed with gel rolls placed under the patient's chest.  The neck was placed into forward flexed posture and the arms were secured to the patient's side and the head was secured into the Mayfield head holder attachment.  The shoulders were taped to the inferior aspect of the bed.  The neck was then prepped and draped in the usual sterile fashion.  After time-out procedure was performed and after antibiotics were given, I did make a midline incision from C3-T2.  The lamina from C3-T2 was subperiosteally exposed.  The facet joints from C3-T2 were also subperiosteally exposed and a 1.7 mm bur was used to decorticate the facet joints of each level to be fused.  I then used a 1.7-mm bur to prepare an entry hole for the lateral mass screws at C3  and at C5.  A 14 mm drill was then used followed by a 3-mm tap.  Bone wax was placed into the lateral mass holes.  I then performed a bilateral laminotomy, partial facetectomy, and foraminotomy at the C6-7 level in order to palpate the C7 pedicles.  Using anatomical landmarks, I again used a 1.7- mm bur to prepare the entry hole for the C7 pedicle screws.  I then used a 3.5 mm tap.  The tap was advanced to 22 mm.  Bone wax was placed in its place.  A ball-tip probe was used to confirm that there was no cortical violation.  Using anatomical landmarks, I did use a high-speed bur  to prepare the entry point for the T2 pedicle screw.  A curved gearshift probe was used followed by a 4 mm tap.  Bone wax was placed into the entry hole for the pedicle, then bone wax was placed.  At this point, I went forward with the foraminal decompression on the left-sided C7-T1 and at T1-2.  A laminotomy and partial facetectomy was performed in the usual fashion.  Of note, it was obvious that there was rather severe neural foraminal compression of the exiting C8 nerve and the exiting T1 nerve on the left side.  I was able to entirely release all undue pressure in the foraminal area.  Adequate decompression was confirmed at both levels using a nerve hook.  I was able to palpate the pedicle below each foramen, including the lateral aspect of the pedicle, which did confirm adequate decompression of each foramen.  At this point, I did use a 3 mm high-speed bur to decorticate the posterior elements and transverse processes of T1 and T2.  3.5 x 14 mm lateral mass screws were placed at C3 and C5 bilaterally.  A 4 x 22 mm screw was placed at C7 bilaterally and a 4.35 x 25 mm screw was placed at T2 bilaterally.  A 5.5 mm rod was cut to the appropriate length and contoured into the appropriate degree of lordosis and was secured into the heads of the screws.  Caps were placed and a final locking procedure was then performed.  At this point, I did mix autograft obtained from the decompression with a total of 20 mL of DBX mix.  This was packed into the posterior elements on the right and the left sides and along the posterolateral gutter down to T2 on the right and the left sides. Of note, the retractors were relaxed approximately every 30 minutes. The wound was copiously irrigated multiple times throughout the procedure for a total of approximately 2 L of irrigation.  At this point, the fascia was closed using #1 Vicryl.  The subcutaneous layer was closed using 2-0 Vicryl and the skin was closed  using 3-0 Monocryl. Benzoin and Steri-Strips were applied followed by sterile dressing.  Of note, Jason Coop was my assistant throughout the entirety of the procedure and aided in essential retraction and suctioning required throughout the entirety of the procedure.  Once the procedure was completed, the patient was rolled supine, and Mayfield head holder was removed.     Estill Bamberg, MD     MD/MEDQ  D:  07/24/2012  T:  07/25/2012  Job:  [id number]  cc:   Massie Maroon, MD

## 2012-07-25 NOTE — Anesthesia Postprocedure Evaluation (Signed)
Anesthesia Post Note  Patient: [name]  Procedure(s) Performed: Procedure(s) (LRB): POSTERIOR CERVICAL FUSION/FORAMINOTOMY LEVEL 5 (N/A)  Anesthesia type: general  Patient location: PACU  Post pain: Pain level controlled  Post assessment: Patient's Cardiovascular Status Stable  Last Vitals:  Filed Vitals:   07/25/12 0509  BP: 142/64  Pulse: 106  Temp: 37.7 C  Resp: 16    Post vital signs: Reviewed and stable  Level of consciousness: sedated  Complications: No apparent anesthesia complications

## 2012-07-28 MED FILL — Heparin Sodium (Porcine) Inj 1000 Unit/ML: INTRAMUSCULAR | Qty: 30 | Status: AC

## 2012-07-28 MED FILL — Sodium Chloride IV Soln 0.9%: INTRAVENOUS | Qty: 1000 | Status: AC

## 2012-08-07 NOTE — Discharge Summary (Signed)
Patient ID: [name] MRN: [id number] DOB/AGE: 1946-01-13 67 y.o.  Admit date: 07/23/2012 Discharge date: 07/25/2012  Admission Diagnoses:  MYELORADICULOPATHY  Discharge Diagnoses:  Same  Past Medical History  Diagnosis Date  . Peripheral vascular disease   . Fibromyalgia   . Hypertension   . Hyperlipidemia   . Post-menopausal     on HRT x 25 years  . Hypothyroidism   . GERD (gastroesophageal reflux disease)   . Asthma   . Environmental allergies   . Arthritis     "all over" (07/25/2012)    Surgeries: Procedure(s):  C3-6 ACDF & Posterior Cervical Fusion C3-T2 with foraminotomies C6-7, C7-T1   Discharged Condition: Improved  Hospital Course: [name] is an 67 y.o. female who was admitted 07/23/2012 for operative treatment of MYELORADICULOPATHY. Patient has severe unremitting pain that affects sleep, daily activities, and work/hobbies. After pre-op clearance the patient was taken to the operating room on 07/23/2012 - 07/24/2012 and underwent  Procedure(s):  C3-6 ACDF & Posterior Cervical Fusion C3-T2 with foraminotomies C6-7, C7-T1   Patient was given perioperative antibiotics:  Anti-infectives   Start     Dose/Rate Route Frequency Ordered Stop   07/24/12 1700  ceFAZolin (ANCEF) IVPB 1 g/50 mL premix     1 g 100 mL/hr over 30 Minutes Intravenous Every 8 hours 07/24/12 1553 07/25/12 0122   07/24/12 0830  ceFAZolin (ANCEF) IVPB 2 g/50 mL premix     2 g 100 mL/hr over 30 Minutes Intravenous  Once 07/24/12 0822 07/24/12 0745   07/24/12 0715  ceFAZolin (ANCEF) 2-3 GM-% IVPB SOLR    Comments:  HYPES, KAREN: cabinet override      07/24/12 0715 07/24/12 1929   07/24/12 0000  ceFAZolin (ANCEF) IVPB 1 g/50 mL premix  Status:  Discontinued     1 g 100 mL/hr over 30 Minutes Intravenous Every 8 hours 07/23/12 2259 07/24/12 1801   07/23/12 0600  ceFAZolin (ANCEF) IVPB 2 g/50 mL premix     2 g 100 mL/hr over 30 Minutes Intravenous On call to O.R. 07/22/12 1446 07/23/12 1707        Patient was given sequential compression devices, early ambulation, and chemoprophylaxis to prevent DVT.  Patient benefited maximally from hospital stay and there were no complications.    Recent vital signs: BP 142/64  Pulse 106  Temp(Src) 99.9 F (37.7 C) (Oral)  Resp 16  Ht 5\' 7"  (1.702 m)  Wt 71.9 kg (158 lb 8.2 oz)  BMI 24.82 kg/m2  SpO2 95%   Discharge Medications:     Medication List    STOP taking these medications       aspirin 81 MG tablet     clopidogrel 75 MG tablet  Commonly known as:  PLAVIX      TAKE these medications       amitriptyline 150 MG tablet  Commonly known as:  ELAVIL  Take 150 mg by mouth at bedtime.     amLODipine 10 MG tablet  Commonly known as:  NORVASC  Take 10 mg by mouth daily.     atorvastatin 20 MG tablet  Commonly known as:  LIPITOR  Take 20 mg by mouth at bedtime.     estrogens (conjugated) 0.625 MG tablet  Commonly known as:  PREMARIN  Take 0.625 mg by mouth daily. Take daily for 21 days then do not take for 7 days.     Fluticasone-Salmeterol 100-50 MCG/DOSE Aepb  Commonly known as:  ADVAIR  Inhale 1 puff  into the lungs every 12 (twelve) hours.     levothyroxine 150 MCG tablet  Commonly known as:  SYNTHROID, LEVOTHROID  Take 150 mcg by mouth daily.     losartan 50 MG tablet  Commonly known as:  COZAAR  Take 50 mg by mouth daily.     pantoprazole 40 MG tablet  Commonly known as:  PROTONIX  Take 40 mg by mouth daily.        Diagnostic Studies: Dg Chest 2 View  07/17/2012  *RADIOLOGY REPORT*  Clinical Data: Preoperative evaluation; history hypertension  CHEST - 2 VIEW  Comparison: February 23, 2007  Findings: There is stable scarring in the bases.  The lungs are otherwise clear.  Heart size and pulmonary vascularity are normal. No adenopathy.  No bone lesions.  IMPRESSION: No edema or consolidation.   Original Report Authenticated By: Bretta Bang, M.D.    Dg Cervical Spine 1 View  07/23/2012  *RADIOLOGY  REPORT*  Clinical Data: C3-C6 ACDF  DG C-ARM 1-60 MIN,DG CERVICAL SPINE - 1 VIEW  Technique: A single lateral fluoro spot images submitted.  Fluoro time is 16 seconds  Comparison:  MRI cervical spine 03/01/2012.  Findings: The patient is status post ACDF at C3-4, C4-5, and C5-6. Metallic disc spacers are restored height at each level.  Alignment is anatomic.  The patient is intubated.  A surgical sponge is noted anteriorly.  Anterior plate screw fixation is noted from C3-C6.  IMPRESSION:  1.  Status post ACDF C3-4, C4-5, and C5-6 without radiographic evidence for complication.   Original Report Authenticated By: Marin Roberts, M.D.    Dg Cervical Spine 2 Or 3 Views  07/24/2012  *RADIOLOGY REPORT*  Clinical Data: Postop.  CERVICAL SPINE - 2-3 VIEW  Comparison: 07/24/2012  Findings: Again noted is the anterior plate and interbody fusion from C3-C6.  There is new posterior screw and rod fixation from C3- T2.  No evidence for a hardware complication.  IMPRESSION: New posterior fusion of the cervicothoracic spine.   Original Report Authenticated By: Richarda Overlie, M.D.    Dg Cervical Spine 2-3 Views  07/24/2012  *RADIOLOGY REPORT*  Clinical Data: C3-T2 decompression.  CERVICAL SPINE - 2-3 VIEW  Comparison: Fluoroscopic spot views of the cervical spine 07/23/2012.  Findings: Two lateral views of the cervical spine are provided. Changes of prior C3-6 ACDF are noted.  First image provided today also demonstrates a probe directed toward the C4 level.  On the second image, posterior screws and stabilization rods are in place. New hardware extends from C3-T2. The upper thoracic spine is not well demonstrated due to overlying soft tissue structures.  IMPRESSION: C3-T2 posterior decompression.   Original Report Authenticated By: Holley Dexter, M.D.    Dg C-arm 1-60 Min  07/23/2012  *RADIOLOGY REPORT*  Clinical Data: C3-C6 ACDF  DG C-ARM 1-60 MIN,DG CERVICAL SPINE - 1 VIEW  Technique: A single lateral fluoro spot  images submitted.  Fluoro time is 16 seconds  Comparison:  MRI cervical spine 03/01/2012.  Findings: The patient is status post ACDF at C3-4, C4-5, and C5-6. Metallic disc spacers are restored height at each level.  Alignment is anatomic.  The patient is intubated.  A surgical sponge is noted anteriorly.  Anterior plate screw fixation is noted from C3-C6.  IMPRESSION:  1.  Status post ACDF C3-4, C4-5, and C5-6 without radiographic evidence for complication.   Original Report Authenticated By: Marin Roberts, M.D.     Disposition: 01-Home or Self Care   PO Day #  2 S/P C3-6 ACDF & PO Day #1 S/P Posterior Cervical Fusion C3-T2 with foraminotomies C6-7, C7-T1  -Px well controlled with dilaudid this AM  -Pt has not been able to take PO meds secondary to swallowing difficulties  -D/C home on percocet/valium, written scripts in chart  -Swallowing Difficulty  -Continuous pulse ox ordered, advised pt and staff to alert [place] immediately if any breathing difficulties  -Seen by RT this am, flutter valve performed to help mobilize secretions and pt feeling much better  -Cont attempts at PO meds/soft foods, advance diet as tolerated  -Pt eager to go home today  -Informed she can go home if able to tolerate PO meds and swallowing improves    Signed: Georga Bora 08/07/2012, 3:04 PM

## 2014-02-20 IMAGING — RF DG CERVICAL SPINE 1V
1 series · 1 of 1 positions shown · non-contrast
Comparison: MRI cervical spine 03/01/2012.

CLINICAL DATA: C3-C6 ACDF

DG C-ARM 1-60 MIN,DG CERVICAL SPINE - 1 VIEW
TECHNIQUE: A single lateral fluoro spot images submitted.  Fluoro
time is 16 seconds

[Series 1: run · 1 of 1 slices shown]
[im 1/1]
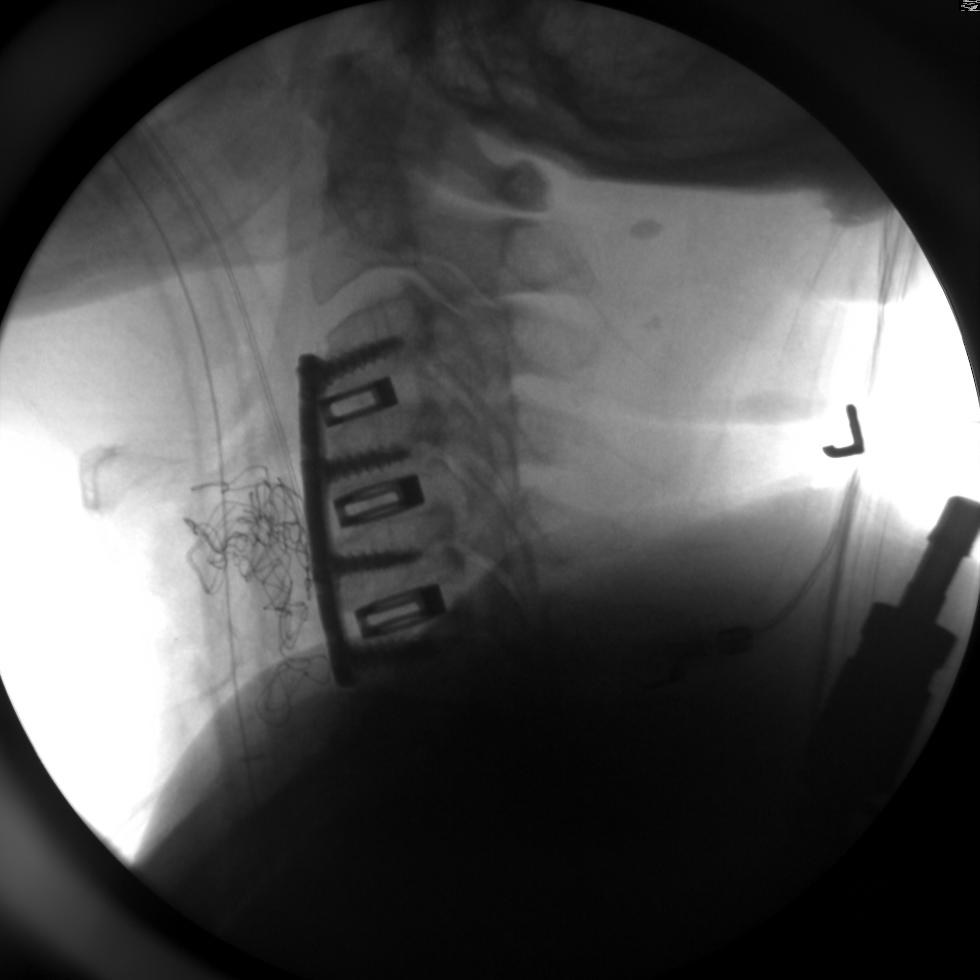

[1 of 1 positions shown; findings below may reference images not displayed]

FINDINGS: The patient is status post ACDF at C3-4, C4-5, and C5-6.
Metallic disc spacers are restored height at each level.  Alignment
is anatomic.  The patient is intubated.  A surgical sponge is noted
anteriorly.  Anterior plate screw fixation is noted from C3-C6.
IMPRESSION: 1.  Status post ACDF C3-4, C4-5, and C5-6 without radiographic
evidence for complication.

## 2015-01-25 ENCOUNTER — Encounter: Payer: Self-pay | Admitting: Obstetrics and Gynecology

## 2016-04-15 ENCOUNTER — Encounter (HOSPITAL_COMMUNITY): Payer: Self-pay | Admitting: Emergency Medicine

## 2016-04-15 ENCOUNTER — Ambulatory Visit (INDEPENDENT_AMBULATORY_CARE_PROVIDER_SITE_OTHER): Payer: Medicare Other

## 2016-04-15 ENCOUNTER — Ambulatory Visit (HOSPITAL_COMMUNITY)
Admission: EM | Admit: 2016-04-15 | Discharge: 2016-04-15 | Disposition: A | Discharge status: Home / Self Care | Payer: Medicare Other | Attending: Emergency Medicine | Admitting: Emergency Medicine

## 2016-04-15 DIAGNOSIS — J069 Acute upper respiratory infection, unspecified: Secondary | ICD-10-CM

## 2016-04-15 DIAGNOSIS — R062 Wheezing: Secondary | ICD-10-CM

## 2016-04-15 DIAGNOSIS — B9789 Other viral agents as the cause of diseases classified elsewhere: Secondary | ICD-10-CM | POA: Diagnosis not present

## 2016-04-15 MED ORDER — METHYLPREDNISOLONE ACETATE 80 MG/ML IJ SUSP
INTRAMUSCULAR | Status: AC
  Filled 2016-04-15: qty 1

## 2016-04-15 MED ORDER — IPRATROPIUM-ALBUTEROL 0.5-2.5 (3) MG/3ML IN SOLN
RESPIRATORY_TRACT | Status: AC
  Filled 2016-04-15: qty 3

## 2016-04-15 MED ORDER — ALBUTEROL SULFATE HFA 108 (90 BASE) MCG/ACT IN AERS: 1.0000 | INHALATION_SPRAY | Freq: Four times a day (QID) | RESPIRATORY_TRACT | 3 refills | Status: DC | PRN

## 2016-04-15 MED ORDER — METHYLPREDNISOLONE ACETATE 80 MG/ML IJ SUSP
80.0000 mg | Freq: Once | INTRAMUSCULAR | Status: AC
  Administered 2016-04-15: 80 mg via INTRAMUSCULAR

## 2016-04-15 MED ORDER — ACETAMINOPHEN 325 MG PO TABS
ORAL_TABLET | ORAL | Status: AC
  Filled 2016-04-15: qty 2

## 2016-04-15 MED ORDER — IPRATROPIUM-ALBUTEROL 0.5-2.5 (3) MG/3ML IN SOLN
3.0000 mL | Freq: Once | RESPIRATORY_TRACT | Status: AC
  Administered 2016-04-15: 3 mL via RESPIRATORY_TRACT

## 2016-04-15 MED ORDER — ACETAMINOPHEN 325 MG PO TABS
650.0000 mg | ORAL_TABLET | Freq: Once | ORAL | Status: AC
  Administered 2016-04-15: 650 mg via ORAL

## 2016-04-15 MED ORDER — DEXTROMETHORPHAN HBR 15 MG/5ML PO SYRP: 10.0000 mL | ORAL_SOLUTION | Freq: Four times a day (QID) | ORAL | 0 refills | Status: DC | PRN

## 2016-04-15 NOTE — Discharge Instructions (Signed)
A neti pot is a container designed to rinse debris or mucus from your nasal cavity. You might use a neti pot to treat symptoms of nasal allergies, sinus problems or colds. °If you choose to make your own saltwater solution, it's important to use bottled water that has been distilled or sterilized. Tap water is acceptable if it's been boiled for several minutes and then left to cool until it is lukewarm. °To use the neti pot, tilt your head sideways over the sink and place the spout of the neti pot in the upper nostril. Breathing through your open mouth, gently pour the saltwater solution into your upper nostril so that the liquid drains through the lower nostril. Repeat on the other side. °Be sure to rinse the irrigation device after each use with similarly distilled, sterile, previously boiled and cooled, or filtered water and leave open to air dry. °Neti pots are often available in pharmacies and health food stores, as well online.  ° °

## 2016-04-15 NOTE — ED Provider Notes (Signed)
CSN: [id number]     Arrival date & time 04/15/16  1307 History   First MD Initiated Contact with Patient 04/15/16 1515     Chief Complaint  Patient presents with  . URI   (Consider location/radiation/quality/duration/timing/severity/associated sxs/prior Treatment)  HPI   The patient is a 70 year old female presenting today with complaints of cough congestion and wheezing times approximately 2-3 days. Patient states she's having difficulty breathing on expiration. She states she does not have a rescue inhaler but she has had to take steroids for her breathing in the past. She states she does take an Advair Diskus daily for her "breathing".  Has some sinus congestion and nasal drainage. States her throat is uncomfortable, but not sore. Denies nausea, vomiting or diarrhea, but does have some generalized fatigue and body aches.  Past Medical History:  Diagnosis Date  . Arthritis    "all over" (07/25/2012)  . Asthma   . Environmental allergies   . Fibromyalgia   . GERD (gastroesophageal reflux disease)   . Hyperlipidemia   . Hypertension   . Hypothyroidism   . Peripheral vascular disease (HCC)   . Post-menopausal    on HRT x 25 years   Past Surgical History:  Procedure Laterality Date  . ABDOMINAL HYSTERECTOMY  ~ 1977  . ANTERIOR CERVICAL DECOMPRESSION/DISCECTOMY FUSION 4 LEVELS N/A 07/23/2012   Procedure: ANTERIOR CERVICAL DECOMPRESSION/DISCECTOMY FUSION 3 LEVELS;  Surgeon: Emilee HeroMark [name] Dumonski, MD;  [facility];  Service: Orthopedics;  Laterality: N/A;  Anterior cervical decompression fusion, cervical 3-4, cervical 4-5, cervical 5-6 with instrumentation and allograft.  . FEMORAL ARTERY STENT Bilateral 2011  . POSTERIOR CERVICAL FUSION/FORAMINOTOMY N/A 07/24/2012   Procedure: POSTERIOR CERVICAL FUSION/FORAMINOTOMY LEVEL 5;  Surgeon: Emilee HeroMark [name] Dumonski, MD;  Location: [facility] OR;  Service: Orthopedics;  Laterality: N/A;  Posterior cervical decompression, cervical 6-7, C7-T1, T1-T2.  Cervical fusion cervical 3-4, cervical 4-5, cervical 5-6, cervical 6-7, cervical 7-T1 with allograft, local autograft.  Marland Kitchen. POSTERIOR FUSION CERVICAL SPINE  07/24/2012   C3-T2 posterior fusion with instrumentation and allograft, C6-T2 decompression)   . TONSILLECTOMY  1960's?   No family history on file. Social History  Substance Use Topics  . Smoking status: Current Every Day Smoker    Packs/day: 0.12    Years: 30.00    Types: Cigarettes  . Smokeless tobacco: Never Used     Comment: 07/25/2012 offered smoking cessation materials; pt declines  . Alcohol use 0.0 oz/week     Comment: 07/25/2012 "glass of wine maybe once a month; not often"   OB History    No data available     Review of Systems  Constitutional: Positive for chills, fatigue and fever.  HENT: Positive for congestion, postnasal drip, sinus pressure and sore throat. Negative for sneezing and trouble swallowing.   Eyes: Negative.  Negative for discharge and itching.  Respiratory: Positive for cough, shortness of breath and wheezing.   Cardiovascular: Negative.   Gastrointestinal: Negative.   Endocrine: Negative.   Genitourinary: Negative.   Musculoskeletal: Negative for neck stiffness.  Skin: Negative.  Negative for pallor and rash.  Allergic/Immunologic: Positive for environmental allergies. Negative for food allergies and immunocompromised state.  Neurological: Negative.   Hematological: Negative.   Psychiatric/Behavioral: Negative.     Allergies  Codeine and Pine  Home Medications   Prior to Admission medications   Medication Sig Start Date End Date Taking? Authorizing Provider  albuterol (PROVENTIL HFA;VENTOLIN HFA) 108 (90 Base) MCG/ACT inhaler Inhale 1-2 puffs into the lungs every 6 (six)  hours as needed for wheezing or shortness of breath. 04/15/16   Servando Salinaatherine H Rossi, NP  amitriptyline (ELAVIL) 150 MG tablet Take 150 mg by mouth at bedtime.    Historical Provider, MD  amLODipine (NORVASC) 10 MG tablet Take  10 mg by mouth daily.    Historical Provider, MD  atorvastatin (LIPITOR) 20 MG tablet Take 20 mg by mouth at bedtime.    Historical Provider, MD  dextromethorphan 15 MG/5ML syrup Take 10 mLs (30 mg total) by mouth 4 (four) times daily as needed for cough. 04/15/16   Servando Salinaatherine H Rossi, NP  estrogens, conjugated, (PREMARIN) 0.625 MG tablet Take 0.625 mg by mouth daily. Take daily for 21 days then do not take for 7 days.    Historical Provider, MD  Fluticasone-Salmeterol (ADVAIR) 100-50 MCG/DOSE AEPB Inhale 1 puff into the lungs every 12 (twelve) hours.    Historical Provider, MD  levothyroxine (SYNTHROID, LEVOTHROID) 150 MCG tablet Take 150 mcg by mouth daily.    Historical Provider, MD  losartan (COZAAR) 50 MG tablet Take 50 mg by mouth daily.    Historical Provider, MD  pantoprazole (PROTONIX) 40 MG tablet Take 40 mg by mouth daily.    Historical Provider, MD   Meds Ordered and Administered this Visit   Medications  acetaminophen (TYLENOL) tablet 650 mg (650 mg Oral Given 04/15/16 1500)  ipratropium-albuterol (DUONEB) 0.5-2.5 (3) MG/3ML nebulizer solution 3 mL (3 mLs Nebulization Given 04/15/16 1533)  methylPREDNISolone acetate (DEPO-MEDROL) injection 80 mg (80 mg Intramuscular Given 04/15/16 1644)    BP 145/79 (BP Location: Left Arm)   Pulse 105   Temp 102.8 F (39.3 C) (Oral)   Resp 18   SpO2 99%  No data found.   Physical Exam  Constitutional: She appears well-developed and well-nourished. No distress.  Eyes: Conjunctivae are normal. Pupils are equal, round, and reactive to light. Right eye exhibits no discharge. Left eye exhibits no discharge. No scleral icterus.  Neck: Normal range of motion. Neck supple. No thyromegaly present.  Cardiovascular: Normal rate, regular rhythm, normal heart sounds and intact distal pulses.  Exam reveals no gallop and no friction rub.   No murmur heard. Pulmonary/Chest: She has wheezes. She has no rales.  Increased work of breathing noted. Patient  does cough when speaking. Inspiratory and expiratory wheezes noted throughout.  Skin: She is not diaphoretic.  Nursing note and vitals reviewed.   Increased air exchange heard following hand-held nebulizer treatment. Urgent Care Course   Clinical Course     Procedures (including critical care time)  Labs Review Labs Reviewed - No data to display  Imaging Review Dg Chest 2 View  Result Date: 04/15/2016 CLINICAL DATA:  Patient with flu-like symptoms 4 days prior.  Cough. EXAM: CHEST  2 VIEW COMPARISON:  Chest radiograph 02/23/2007. FINDINGS: Stable cardiac and mediastinal contours. Cervical spinal fusion hardware. Minimal linear opacities lung bases bilaterally. No pleural effusion or pneumothorax. Mid thoracic spine degenerative changes. IMPRESSION: Basilar scarring.  No acute cardiopulmonary process. Electronically Signed   By: Annia Beltrew  Davis M.D.   On: 04/15/2016 16:13   Discussed with patient the importance of removing sinus congestion to prevent future bronchitis or sinusitis. Plan of care to utilize saline irrigation.  MDM   1. Viral URI with cough   2. Wheezing    The usual and customary discharge instructions and warnings were given.  The patient verbalizes understanding and agrees to plan of care.       Servando Salinaatherine H Rossi, NP 04/15/16 1700

## 2016-04-15 NOTE — ED Triage Notes (Signed)
Patient has a lot of chest congestion.  Denies discolored phlegm.  Denies fever

## 2016-05-10 DIAGNOSIS — N390 Urinary tract infection, site not specified: Secondary | ICD-10-CM | POA: Diagnosis not present

## 2016-05-31 ENCOUNTER — Encounter (INDEPENDENT_AMBULATORY_CARE_PROVIDER_SITE_OTHER): Payer: Self-pay | Admitting: Orthopaedic Surgery

## 2016-05-31 ENCOUNTER — Ambulatory Visit (INDEPENDENT_AMBULATORY_CARE_PROVIDER_SITE_OTHER): Payer: Medicare Other | Admitting: Orthopaedic Surgery

## 2016-05-31 VITALS — BP 144/81 | HR 86 | Ht 68.0 in | Wt 152.0 lb

## 2016-05-31 DIAGNOSIS — G8929 Other chronic pain: Secondary | ICD-10-CM

## 2016-05-31 DIAGNOSIS — I10 Essential (primary) hypertension: Secondary | ICD-10-CM | POA: Diagnosis not present

## 2016-05-31 DIAGNOSIS — M25511 Pain in right shoulder: Secondary | ICD-10-CM

## 2016-05-31 DIAGNOSIS — E039 Hypothyroidism, unspecified: Secondary | ICD-10-CM | POA: Diagnosis not present

## 2016-05-31 DIAGNOSIS — R739 Hyperglycemia, unspecified: Secondary | ICD-10-CM | POA: Diagnosis not present

## 2016-05-31 MED ORDER — BUPIVACAINE HCL 0.5 % IJ SOLN
2.0000 mL | INTRAMUSCULAR | Status: AC | PRN
  Administered 2016-05-31: 2 mL via INTRA_ARTICULAR

## 2016-05-31 MED ORDER — LIDOCAINE HCL 1 % IJ SOLN
2.0000 mL | INTRAMUSCULAR | Status: AC | PRN
  Administered 2016-05-31: 2 mL

## 2016-05-31 MED ORDER — METHYLPREDNISOLONE ACETATE 40 MG/ML IJ SUSP
80.0000 mg | INTRAMUSCULAR | Status: AC | PRN
  Administered 2016-05-31: 80 mg

## 2016-05-31 NOTE — Progress Notes (Signed)
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 We will repeat films and possibly MRI scan if no improvement.  Follow-Up Instructions: No Follow-up on file.   Orders:  No orders of the defined types were placed in this encounter.  No orders of the defined types were placed in this encounter.     Procedures: Large Joint Inj Date/Time: 05/31/2016 2:22 PM Performed by: Valeria BatmanWHITFIELD, Arvil Utz W Authorized by: Valeria BatmanWHITFIELD, Rayson Rando W   Consent Given by:  Patient Timeout: prior to procedure the correct patient, procedure, and site was verified   Indications:  Pain Location:  Shoulder Site:  R glenohumeral Prep: patient was prepped and draped in usual sterile fashion   Needle Size:  25 G Needle Length:  1.5 inches Approach:  Posterior Ultrasound Guidance: No   Fluoroscopic Guidance: No   Arthrogram: No   Medications:  2 mL lidocaine 1 %; 2 mL bupivacaine 0.5 %; 80 mg methylPREDNISolone acetate 40 MG/ML Aspiration Attempted: No   Patient tolerance:  Patient tolerated the procedure well with no immediate complications     Clinical Data: No additional findings.   Subjective: No chief complaint on file.   Pt presents with chronic right shoulder pain x 1 month. Pt has 3 pins in her neck on the right side, not sure if that is related. She has tolerated cortisone injections very well and would like to have one today. She states that she had an arthroscopic debridement back on February 09, 2010, that of an SAD, DCR and mini-open rotator cuff repair  Mrs. Tenny CrawRoss was last seen in  December 2016 x-rays reveal some early spurring at the inferior aspect of the glenoid possibly consistent with early osteoarthritis. She had a prior arthroscopic distal clavicle resection and subacromial decompression and mini open rotator cuff tear repair in 2011 and she denies any recent injury or trauma, fever or chills or numbness and tingling  Review of Systems   Objective: Vital Signs: There were no vitals taken for this visit.  Physical Exam  Ortho Exam painful overhead arc of motion right shoulder with minimally positive impingement on the extreme of external rotation. Biceps appears to be intact. Skin intact. Neurovascular exam intact. Some referred pain to the posterior aspect of the shoulder with range of motion.  Specialty Comments:  No specialty comments available.  Imaging: No results found.   PMFS History: There are no active problems to display for this patient.  Past Medical History:  Diagnosis Date  . Arthritis    "all over" (07/25/2012)  . Asthma   . Environmental allergies   . Fibromyalgia   . GERD (gastroesophageal reflux disease)   . Hyperlipidemia   . Hypertension   . Hypothyroidism   . Peripheral vascular disease (HCC)   . Post-menopausal    on HRT x 25 years    No family history on file.  Past Surgical History:  Procedure Laterality Date  . ABDOMINAL HYSTERECTOMY  ~ 1977  . ANTERIOR CERVICAL  DECOMPRESSION/DISCECTOMY FUSION 4 LEVELS N/A 07/23/2012   Procedure: ANTERIOR CERVICAL DECOMPRESSION/DISCECTOMY FUSION 3 LEVELS;  Surgeon: Emilee Hero, MD;  Location: [facility];  Service: Orthopedics;  Laterality: N/A;  Anterior cervical decompression fusion, cervical 3-4, cervical 4-5, cervical 5-6 with instrumentation and allograft.  . FEMORAL ARTERY STENT Bilateral 2011  . POSTERIOR CERVICAL FUSION/FORAMINOTOMY N/A 07/24/2012   Procedure: POSTERIOR CERVICAL FUSION/FORAMINOTOMY LEVEL 5;  Surgeon: Emilee Hero, MD;  Location: [facility] OR;  Service:  Orthopedics;  Laterality: N/A;  Posterior cervical decompression, cervical 6-7, C7-T1, T1-T2. Cervical fusion cervical 3-4, cervical 4-5, cervical 5-6, cervical 6-7, cervical 7-T1 with allograft, local autograft.  Marland Kitchen POSTERIOR FUSION CERVICAL SPINE  07/24/2012   C3-T2 posterior fusion with instrumentation and allograft, C6-T2 decompression)   . TONSILLECTOMY  1960's?   Social History   Occupational History  . Not on file.   Social History Main Topics  . Smoking status: Current Every Day Smoker    Packs/day: 0.12    Years: 30.00    Types: Cigarettes  . Smokeless tobacco: Never Used     Comment: 07/25/2012 offered smoking cessation materials; pt declines  . Alcohol use 0.0 oz/week     Comment: 07/25/2012 "glass of wine maybe once a month; not often"  . Drug use: No  . Sexual activity: Yes

## 2016-06-07 DIAGNOSIS — M25561 Pain in right knee: Secondary | ICD-10-CM | POA: Diagnosis not present

## 2016-06-07 DIAGNOSIS — E039 Hypothyroidism, unspecified: Secondary | ICD-10-CM | POA: Diagnosis not present

## 2016-06-07 DIAGNOSIS — Z0000 Encounter for general adult medical examination without abnormal findings: Secondary | ICD-10-CM | POA: Diagnosis not present

## 2016-06-07 DIAGNOSIS — R739 Hyperglycemia, unspecified: Secondary | ICD-10-CM | POA: Diagnosis not present

## 2016-06-07 DIAGNOSIS — I10 Essential (primary) hypertension: Secondary | ICD-10-CM | POA: Diagnosis not present

## 2016-06-07 DIAGNOSIS — Z5181 Encounter for therapeutic drug level monitoring: Secondary | ICD-10-CM | POA: Diagnosis not present

## 2016-06-07 DIAGNOSIS — Z79899 Other long term (current) drug therapy: Secondary | ICD-10-CM | POA: Diagnosis not present

## 2016-06-07 DIAGNOSIS — E559 Vitamin D deficiency, unspecified: Secondary | ICD-10-CM | POA: Diagnosis not present

## 2016-07-16 DIAGNOSIS — Z1231 Encounter for screening mammogram for malignant neoplasm of breast: Secondary | ICD-10-CM | POA: Diagnosis not present

## 2016-09-03 DIAGNOSIS — R21 Rash and other nonspecific skin eruption: Secondary | ICD-10-CM | POA: Diagnosis not present

## 2016-09-03 DIAGNOSIS — Z5181 Encounter for therapeutic drug level monitoring: Secondary | ICD-10-CM | POA: Diagnosis not present

## 2016-09-03 DIAGNOSIS — E7800 Pure hypercholesterolemia, unspecified: Secondary | ICD-10-CM | POA: Diagnosis not present

## 2016-09-03 DIAGNOSIS — I10 Essential (primary) hypertension: Secondary | ICD-10-CM | POA: Diagnosis not present

## 2016-09-03 DIAGNOSIS — L299 Pruritus, unspecified: Secondary | ICD-10-CM | POA: Diagnosis not present

## 2016-11-26 DIAGNOSIS — I10 Essential (primary) hypertension: Secondary | ICD-10-CM | POA: Diagnosis not present

## 2016-11-26 DIAGNOSIS — M25561 Pain in right knee: Secondary | ICD-10-CM | POA: Diagnosis not present

## 2016-11-26 DIAGNOSIS — E559 Vitamin D deficiency, unspecified: Secondary | ICD-10-CM | POA: Diagnosis not present

## 2016-11-26 DIAGNOSIS — R739 Hyperglycemia, unspecified: Secondary | ICD-10-CM | POA: Diagnosis not present

## 2016-11-26 DIAGNOSIS — Z5181 Encounter for therapeutic drug level monitoring: Secondary | ICD-10-CM | POA: Diagnosis not present

## 2016-11-30 DIAGNOSIS — I10 Essential (primary) hypertension: Secondary | ICD-10-CM | POA: Diagnosis not present

## 2016-11-30 DIAGNOSIS — E039 Hypothyroidism, unspecified: Secondary | ICD-10-CM | POA: Diagnosis not present

## 2016-11-30 DIAGNOSIS — R739 Hyperglycemia, unspecified: Secondary | ICD-10-CM | POA: Diagnosis not present

## 2016-11-30 DIAGNOSIS — Z0000 Encounter for general adult medical examination without abnormal findings: Secondary | ICD-10-CM | POA: Diagnosis not present

## 2017-03-11 ENCOUNTER — Ambulatory Visit (INDEPENDENT_AMBULATORY_CARE_PROVIDER_SITE_OTHER): Payer: Medicare Other | Admitting: Orthopaedic Surgery

## 2017-04-04 DIAGNOSIS — M549 Dorsalgia, unspecified: Secondary | ICD-10-CM | POA: Diagnosis not present

## 2017-04-04 DIAGNOSIS — G8929 Other chronic pain: Secondary | ICD-10-CM | POA: Diagnosis not present

## 2017-05-20 DIAGNOSIS — L249 Irritant contact dermatitis, unspecified cause: Secondary | ICD-10-CM | POA: Diagnosis not present

## 2017-05-28 DIAGNOSIS — R739 Hyperglycemia, unspecified: Secondary | ICD-10-CM | POA: Diagnosis not present

## 2017-05-28 DIAGNOSIS — Z79899 Other long term (current) drug therapy: Secondary | ICD-10-CM | POA: Diagnosis not present

## 2017-05-28 DIAGNOSIS — Z5181 Encounter for therapeutic drug level monitoring: Secondary | ICD-10-CM | POA: Diagnosis not present

## 2017-05-28 DIAGNOSIS — I10 Essential (primary) hypertension: Secondary | ICD-10-CM | POA: Diagnosis not present

## 2017-05-28 DIAGNOSIS — E559 Vitamin D deficiency, unspecified: Secondary | ICD-10-CM | POA: Diagnosis not present

## 2017-05-28 DIAGNOSIS — E039 Hypothyroidism, unspecified: Secondary | ICD-10-CM | POA: Diagnosis not present

## 2017-06-06 DIAGNOSIS — H2513 Age-related nuclear cataract, bilateral: Secondary | ICD-10-CM | POA: Diagnosis not present

## 2017-06-10 DIAGNOSIS — L249 Irritant contact dermatitis, unspecified cause: Secondary | ICD-10-CM | POA: Diagnosis not present

## 2017-06-21 DIAGNOSIS — E039 Hypothyroidism, unspecified: Secondary | ICD-10-CM | POA: Diagnosis not present

## 2017-06-21 DIAGNOSIS — E559 Vitamin D deficiency, unspecified: Secondary | ICD-10-CM | POA: Diagnosis not present

## 2017-06-21 DIAGNOSIS — R739 Hyperglycemia, unspecified: Secondary | ICD-10-CM | POA: Diagnosis not present

## 2017-06-21 DIAGNOSIS — E7800 Pure hypercholesterolemia, unspecified: Secondary | ICD-10-CM | POA: Diagnosis not present

## 2017-06-21 DIAGNOSIS — M5441 Lumbago with sciatica, right side: Secondary | ICD-10-CM | POA: Diagnosis not present

## 2017-07-04 DIAGNOSIS — L409 Psoriasis, unspecified: Secondary | ICD-10-CM | POA: Diagnosis not present

## 2017-07-19 DIAGNOSIS — Z1231 Encounter for screening mammogram for malignant neoplasm of breast: Secondary | ICD-10-CM | POA: Diagnosis not present

## 2017-07-26 DIAGNOSIS — L409 Psoriasis, unspecified: Secondary | ICD-10-CM | POA: Diagnosis not present

## 2017-08-23 DIAGNOSIS — B9689 Other specified bacterial agents as the cause of diseases classified elsewhere: Secondary | ICD-10-CM | POA: Diagnosis not present

## 2017-08-23 DIAGNOSIS — L02821 Furuncle of head [any part, except face]: Secondary | ICD-10-CM | POA: Diagnosis not present

## 2017-08-23 DIAGNOSIS — L218 Other seborrheic dermatitis: Secondary | ICD-10-CM | POA: Diagnosis not present

## 2017-09-18 DIAGNOSIS — E039 Hypothyroidism, unspecified: Secondary | ICD-10-CM | POA: Diagnosis not present

## 2017-09-18 DIAGNOSIS — M5441 Lumbago with sciatica, right side: Secondary | ICD-10-CM | POA: Diagnosis not present

## 2017-09-18 DIAGNOSIS — E7800 Pure hypercholesterolemia, unspecified: Secondary | ICD-10-CM | POA: Diagnosis not present

## 2017-09-18 DIAGNOSIS — E559 Vitamin D deficiency, unspecified: Secondary | ICD-10-CM | POA: Diagnosis not present

## 2017-09-18 DIAGNOSIS — R739 Hyperglycemia, unspecified: Secondary | ICD-10-CM | POA: Diagnosis not present

## 2017-09-18 DIAGNOSIS — Z79899 Other long term (current) drug therapy: Secondary | ICD-10-CM | POA: Diagnosis not present

## 2017-09-18 DIAGNOSIS — Z5181 Encounter for therapeutic drug level monitoring: Secondary | ICD-10-CM | POA: Diagnosis not present

## 2017-09-25 DIAGNOSIS — R739 Hyperglycemia, unspecified: Secondary | ICD-10-CM | POA: Diagnosis not present

## 2017-09-25 DIAGNOSIS — M5441 Lumbago with sciatica, right side: Secondary | ICD-10-CM | POA: Diagnosis not present

## 2017-09-25 DIAGNOSIS — E7800 Pure hypercholesterolemia, unspecified: Secondary | ICD-10-CM | POA: Diagnosis not present

## 2017-09-25 DIAGNOSIS — E559 Vitamin D deficiency, unspecified: Secondary | ICD-10-CM | POA: Diagnosis not present

## 2017-09-25 DIAGNOSIS — E039 Hypothyroidism, unspecified: Secondary | ICD-10-CM | POA: Diagnosis not present

## 2017-09-25 DIAGNOSIS — Z23 Encounter for immunization: Secondary | ICD-10-CM | POA: Diagnosis not present

## 2017-09-25 DIAGNOSIS — I10 Essential (primary) hypertension: Secondary | ICD-10-CM | POA: Diagnosis not present

## 2017-12-23 DIAGNOSIS — M47816 Spondylosis without myelopathy or radiculopathy, lumbar region: Secondary | ICD-10-CM | POA: Diagnosis not present

## 2018-01-16 DIAGNOSIS — M47816 Spondylosis without myelopathy or radiculopathy, lumbar region: Secondary | ICD-10-CM | POA: Diagnosis not present

## 2018-01-20 DIAGNOSIS — Z79899 Other long term (current) drug therapy: Secondary | ICD-10-CM | POA: Diagnosis not present

## 2018-01-20 DIAGNOSIS — E559 Vitamin D deficiency, unspecified: Secondary | ICD-10-CM | POA: Diagnosis not present

## 2018-01-20 DIAGNOSIS — E039 Hypothyroidism, unspecified: Secondary | ICD-10-CM | POA: Diagnosis not present

## 2018-01-20 DIAGNOSIS — M5441 Lumbago with sciatica, right side: Secondary | ICD-10-CM | POA: Diagnosis not present

## 2018-01-20 DIAGNOSIS — I10 Essential (primary) hypertension: Secondary | ICD-10-CM | POA: Diagnosis not present

## 2018-01-20 DIAGNOSIS — R739 Hyperglycemia, unspecified: Secondary | ICD-10-CM | POA: Diagnosis not present

## 2018-01-30 DIAGNOSIS — J45909 Unspecified asthma, uncomplicated: Secondary | ICD-10-CM | POA: Diagnosis not present

## 2018-01-30 DIAGNOSIS — I739 Peripheral vascular disease, unspecified: Secondary | ICD-10-CM | POA: Diagnosis not present

## 2018-01-30 DIAGNOSIS — E7800 Pure hypercholesterolemia, unspecified: Secondary | ICD-10-CM | POA: Diagnosis not present

## 2018-01-30 DIAGNOSIS — I10 Essential (primary) hypertension: Secondary | ICD-10-CM | POA: Diagnosis not present

## 2018-01-30 DIAGNOSIS — Z23 Encounter for immunization: Secondary | ICD-10-CM | POA: Diagnosis not present

## 2018-01-30 DIAGNOSIS — E039 Hypothyroidism, unspecified: Secondary | ICD-10-CM | POA: Diagnosis not present

## 2018-02-18 DIAGNOSIS — M5441 Lumbago with sciatica, right side: Secondary | ICD-10-CM | POA: Diagnosis not present

## 2018-05-02 DIAGNOSIS — K219 Gastro-esophageal reflux disease without esophagitis: Secondary | ICD-10-CM | POA: Diagnosis not present

## 2018-05-02 DIAGNOSIS — I10 Essential (primary) hypertension: Secondary | ICD-10-CM | POA: Diagnosis not present

## 2018-05-02 DIAGNOSIS — E7800 Pure hypercholesterolemia, unspecified: Secondary | ICD-10-CM | POA: Diagnosis not present

## 2018-05-02 DIAGNOSIS — E039 Hypothyroidism, unspecified: Secondary | ICD-10-CM | POA: Diagnosis not present

## 2018-07-01 DIAGNOSIS — I10 Essential (primary) hypertension: Secondary | ICD-10-CM | POA: Diagnosis not present

## 2018-07-01 DIAGNOSIS — E7800 Pure hypercholesterolemia, unspecified: Secondary | ICD-10-CM | POA: Diagnosis not present

## 2018-07-01 DIAGNOSIS — E039 Hypothyroidism, unspecified: Secondary | ICD-10-CM | POA: Diagnosis not present

## 2018-07-01 DIAGNOSIS — R739 Hyperglycemia, unspecified: Secondary | ICD-10-CM | POA: Diagnosis not present

## 2018-07-07 DIAGNOSIS — J45909 Unspecified asthma, uncomplicated: Secondary | ICD-10-CM | POA: Diagnosis not present

## 2018-07-07 DIAGNOSIS — K219 Gastro-esophageal reflux disease without esophagitis: Secondary | ICD-10-CM | POA: Diagnosis not present

## 2018-07-07 DIAGNOSIS — L409 Psoriasis, unspecified: Secondary | ICD-10-CM | POA: Diagnosis not present

## 2018-07-07 DIAGNOSIS — Z0000 Encounter for general adult medical examination without abnormal findings: Secondary | ICD-10-CM | POA: Diagnosis not present

## 2018-07-07 DIAGNOSIS — E7800 Pure hypercholesterolemia, unspecified: Secondary | ICD-10-CM | POA: Diagnosis not present

## 2018-07-07 DIAGNOSIS — M25561 Pain in right knee: Secondary | ICD-10-CM | POA: Diagnosis not present

## 2018-07-07 DIAGNOSIS — I739 Peripheral vascular disease, unspecified: Secondary | ICD-10-CM | POA: Diagnosis not present

## 2018-07-07 DIAGNOSIS — E039 Hypothyroidism, unspecified: Secondary | ICD-10-CM | POA: Diagnosis not present

## 2018-07-07 DIAGNOSIS — I10 Essential (primary) hypertension: Secondary | ICD-10-CM | POA: Diagnosis not present

## 2018-08-21 DIAGNOSIS — Z20828 Contact with and (suspected) exposure to other viral communicable diseases: Secondary | ICD-10-CM | POA: Diagnosis not present

## 2018-10-27 DIAGNOSIS — Z1231 Encounter for screening mammogram for malignant neoplasm of breast: Secondary | ICD-10-CM | POA: Diagnosis not present

## 2019-01-05 DIAGNOSIS — I10 Essential (primary) hypertension: Secondary | ICD-10-CM | POA: Diagnosis not present

## 2019-01-05 DIAGNOSIS — E039 Hypothyroidism, unspecified: Secondary | ICD-10-CM | POA: Diagnosis not present

## 2019-01-05 DIAGNOSIS — I739 Peripheral vascular disease, unspecified: Secondary | ICD-10-CM | POA: Diagnosis not present

## 2019-01-08 DIAGNOSIS — L409 Psoriasis, unspecified: Secondary | ICD-10-CM | POA: Diagnosis not present

## 2019-01-08 DIAGNOSIS — I10 Essential (primary) hypertension: Secondary | ICD-10-CM | POA: Diagnosis not present

## 2019-01-08 DIAGNOSIS — K5900 Constipation, unspecified: Secondary | ICD-10-CM | POA: Diagnosis not present

## 2019-01-08 DIAGNOSIS — E039 Hypothyroidism, unspecified: Secondary | ICD-10-CM | POA: Diagnosis not present

## 2019-01-08 DIAGNOSIS — M5441 Lumbago with sciatica, right side: Secondary | ICD-10-CM | POA: Diagnosis not present

## 2019-01-08 DIAGNOSIS — R739 Hyperglycemia, unspecified: Secondary | ICD-10-CM | POA: Diagnosis not present

## 2019-01-08 DIAGNOSIS — I739 Peripheral vascular disease, unspecified: Secondary | ICD-10-CM | POA: Diagnosis not present

## 2019-02-09 DIAGNOSIS — E7800 Pure hypercholesterolemia, unspecified: Secondary | ICD-10-CM | POA: Diagnosis not present

## 2019-02-09 DIAGNOSIS — K219 Gastro-esophageal reflux disease without esophagitis: Secondary | ICD-10-CM | POA: Diagnosis not present

## 2019-02-09 DIAGNOSIS — Z0000 Encounter for general adult medical examination without abnormal findings: Secondary | ICD-10-CM | POA: Diagnosis not present

## 2019-02-09 DIAGNOSIS — L709 Acne, unspecified: Secondary | ICD-10-CM | POA: Diagnosis not present

## 2019-02-09 DIAGNOSIS — F17200 Nicotine dependence, unspecified, uncomplicated: Secondary | ICD-10-CM | POA: Diagnosis not present

## 2019-02-09 DIAGNOSIS — R21 Rash and other nonspecific skin eruption: Secondary | ICD-10-CM | POA: Diagnosis not present

## 2019-02-09 DIAGNOSIS — Z780 Asymptomatic menopausal state: Secondary | ICD-10-CM | POA: Diagnosis not present

## 2019-04-15 DIAGNOSIS — R252 Cramp and spasm: Secondary | ICD-10-CM | POA: Diagnosis not present

## 2019-04-15 DIAGNOSIS — M5441 Lumbago with sciatica, right side: Secondary | ICD-10-CM | POA: Diagnosis not present

## 2019-04-15 DIAGNOSIS — L409 Psoriasis, unspecified: Secondary | ICD-10-CM | POA: Diagnosis not present

## 2019-04-15 DIAGNOSIS — M5442 Lumbago with sciatica, left side: Secondary | ICD-10-CM | POA: Diagnosis not present

## 2019-04-15 DIAGNOSIS — M62830 Muscle spasm of back: Secondary | ICD-10-CM | POA: Diagnosis not present

## 2019-04-20 DIAGNOSIS — M1812 Unilateral primary osteoarthritis of first carpometacarpal joint, left hand: Secondary | ICD-10-CM | POA: Diagnosis not present

## 2019-04-20 DIAGNOSIS — M545 Low back pain: Secondary | ICD-10-CM | POA: Diagnosis not present

## 2019-04-20 DIAGNOSIS — M25511 Pain in right shoulder: Secondary | ICD-10-CM | POA: Diagnosis not present

## 2019-04-20 DIAGNOSIS — M5416 Radiculopathy, lumbar region: Secondary | ICD-10-CM | POA: Diagnosis not present

## 2019-04-20 DIAGNOSIS — M79642 Pain in left hand: Secondary | ICD-10-CM | POA: Diagnosis not present

## 2019-04-20 DIAGNOSIS — M1811 Unilateral primary osteoarthritis of first carpometacarpal joint, right hand: Secondary | ICD-10-CM | POA: Diagnosis not present

## 2019-04-20 DIAGNOSIS — M1990 Unspecified osteoarthritis, unspecified site: Secondary | ICD-10-CM | POA: Diagnosis not present

## 2019-04-20 DIAGNOSIS — L4050 Arthropathic psoriasis, unspecified: Secondary | ICD-10-CM | POA: Diagnosis not present

## 2019-04-20 DIAGNOSIS — K219 Gastro-esophageal reflux disease without esophagitis: Secondary | ICD-10-CM | POA: Diagnosis not present

## 2019-04-20 DIAGNOSIS — M79641 Pain in right hand: Secondary | ICD-10-CM | POA: Diagnosis not present

## 2019-04-20 DIAGNOSIS — M160 Bilateral primary osteoarthritis of hip: Secondary | ICD-10-CM | POA: Diagnosis not present

## 2019-04-20 DIAGNOSIS — M797 Fibromyalgia: Secondary | ICD-10-CM | POA: Diagnosis not present

## 2019-05-01 DIAGNOSIS — L4050 Arthropathic psoriasis, unspecified: Secondary | ICD-10-CM | POA: Diagnosis not present

## 2019-05-01 DIAGNOSIS — M5416 Radiculopathy, lumbar region: Secondary | ICD-10-CM | POA: Diagnosis not present

## 2019-05-01 DIAGNOSIS — M545 Low back pain: Secondary | ICD-10-CM | POA: Diagnosis not present

## 2019-05-01 DIAGNOSIS — M1990 Unspecified osteoarthritis, unspecified site: Secondary | ICD-10-CM | POA: Diagnosis not present

## 2019-05-01 DIAGNOSIS — M25511 Pain in right shoulder: Secondary | ICD-10-CM | POA: Diagnosis not present

## 2019-05-01 DIAGNOSIS — M797 Fibromyalgia: Secondary | ICD-10-CM | POA: Diagnosis not present

## 2019-05-01 DIAGNOSIS — K219 Gastro-esophageal reflux disease without esophagitis: Secondary | ICD-10-CM | POA: Diagnosis not present

## 2019-05-05 ENCOUNTER — Encounter: Payer: Self-pay | Admitting: Cardiology

## 2019-05-06 ENCOUNTER — Other Ambulatory Visit: Payer: Self-pay

## 2019-05-06 ENCOUNTER — Encounter: Payer: Self-pay | Admitting: Cardiology

## 2019-05-06 ENCOUNTER — Ambulatory Visit: Payer: Self-pay | Admitting: Cardiology

## 2019-05-06 ENCOUNTER — Ambulatory Visit: Payer: Medicare HMO | Admitting: Cardiology

## 2019-05-06 VITALS — BP 132/70 | HR 88 | Temp 97.8°F | Ht 67.25 in | Wt 149.0 lb

## 2019-05-06 DIAGNOSIS — F17210 Nicotine dependence, cigarettes, uncomplicated: Secondary | ICD-10-CM | POA: Diagnosis not present

## 2019-05-06 DIAGNOSIS — R0989 Other specified symptoms and signs involving the circulatory and respiratory systems: Secondary | ICD-10-CM | POA: Diagnosis not present

## 2019-05-06 DIAGNOSIS — E7800 Pure hypercholesterolemia, unspecified: Secondary | ICD-10-CM | POA: Diagnosis not present

## 2019-05-06 DIAGNOSIS — I739 Peripheral vascular disease, unspecified: Secondary | ICD-10-CM | POA: Diagnosis not present

## 2019-05-06 DIAGNOSIS — I10 Essential (primary) hypertension: Secondary | ICD-10-CM

## 2019-05-06 DIAGNOSIS — F17200 Nicotine dependence, unspecified, uncomplicated: Secondary | ICD-10-CM

## 2019-05-06 DIAGNOSIS — M48062 Spinal stenosis, lumbar region with neurogenic claudication: Secondary | ICD-10-CM

## 2019-05-06 NOTE — Progress Notes (Signed)
Primary Physician/Referring:  Jani Gravel, MD  Patient ID: [name], female    DOB: 1945/05/18, 74 y.o.   MRN: [id number]  Chief Complaint  Patient presents with  . Claudication in peripheral vascular disease  . New Patient (Initial Visit)  . Claudication   HPI:    [name]  is a 74 y.o. African-American female with peripheral arterial disease, history of bilateral SFA angioplasty in 2012, essential hypertension, hyperlipidemia, tobacco use disorder referred back to me for evaluation of PAD.   2 months ago started to have low back pain that radiated into the legs.  Prior to this, was walking 1 hr and 45 min daily.  She had to stop this due to the pain.  Reports that the pain is worse in the morning right after waking, usually lasts about 10-15 minutes and then improves with getting up and moving around.  Pain does not occur after walking.  She was seen by a rheumatologist and told that she has arthritis in her back and legs.  She presents here for evaluation of PAD as she is concerned that it could be contributing to her leg pain.  She is currently smoking 2 cigarettes a day and is interested in quitting.  She denies chest pain, SOB at rest, DOE, leg swelling, palpitations, orthopnea, PND  Past Medical History:  Diagnosis Date  . Arthritis    "all over" (07/25/2012)  . Asthma   . Environmental allergies   . Fibromyalgia   . GERD (gastroesophageal reflux disease)   . Hyperlipidemia   . Hypertension   . Hypothyroidism   . Peripheral vascular disease ([place])   . Post-menopausal    on HRT x 25 years   Past Surgical History:  Procedure Laterality Date  . ABDOMINAL HYSTERECTOMY  ~ 1977  . ANTERIOR CERVICAL DECOMPRESSION/DISCECTOMY FUSION 4 LEVELS N/A 07/23/2012   Procedure: ANTERIOR CERVICAL DECOMPRESSION/DISCECTOMY FUSION 3 LEVELS;  Surgeon: Sinclair Ship, MD;  Location: [place];  Service: Orthopedics;  Laterality: N/A;  Anterior cervical decompression fusion, cervical  3-4, cervical 4-5, cervical 5-6 with instrumentation and allograft.  . FEMORAL ARTERY STENT Bilateral 2011  . POSTERIOR CERVICAL FUSION/FORAMINOTOMY N/A 07/24/2012   Procedure: POSTERIOR CERVICAL FUSION/FORAMINOTOMY LEVEL 5;  Surgeon: Sinclair Ship, MD;  Location: [place];  Service: Orthopedics;  Laterality: N/A;  Posterior cervical decompression, cervical 6-7, C7-T1, T1-T2. Cervical fusion cervical 3-4, cervical 4-5, cervical 5-6, cervical 6-7, cervical 7-T1 with allograft, local autograft.  Marland Kitchen POSTERIOR FUSION CERVICAL SPINE  07/24/2012   C3-T2 posterior fusion with instrumentation and allograft, C6-T2 decompression)   . TONSILLECTOMY  1960's?   Social History   Tobacco Use  . Smoking status: Current Every Day Smoker    Packs/day: 0.12    Years: 30.00    Pack years: 3.60    Types: Cigarettes  . Smokeless tobacco: Never Used  . Tobacco comment: 07/25/2012 offered smoking cessation materials; pt declines  Substance Use Topics  . Alcohol use: Yes    Comment: 07/25/2012 "glass of wine maybe once a month; not often"    ROS  Review of Systems  Constitution: Negative for weight gain.  Cardiovascular: Positive for dyspnea on exertion. Negative for leg swelling and syncope.  Respiratory: Positive for wheezing (occasional). Negative for hemoptysis.   Endocrine: Negative for cold intolerance.  Hematologic/Lymphatic: Does not bruise/bleed easily.  Musculoskeletal: Positive for back pain and muscle cramps.  Gastrointestinal: Negative for hematochezia and melena.  Neurological: Negative for headaches and light-headedness.   Objective  Blood pressure 132/70, pulse 88, temperature 97.8 F (36.6 C), height 5' 7.25" (1.708 m), weight 149 lb (67.6 kg), SpO2 100 %.  Vitals with BMI 05/06/2019 05/31/2016 04/15/2016  Height 5' 7.25" 5\' 8"  -  Weight 149 lbs 152 lbs -  BMI 23.17 23.2 -  Systolic 132 144  Diastolic 70 81 79  Pulse 88 86 105     Physical Exam  Neck: No thyromegaly present.    Cardiovascular: Normal rate, regular rhythm, intact distal pulses and normal pulses. Exam reveals no gallop.  Murmur heard.  Scratchy early systolic murmur is present with a grade of 2/6 at the upper right sternal border. Pulses:      Carotid pulses are on the right side with bruit. No leg edema, no JVD.  Except bilateral PT faint, normal lower extremity arterial pulse.    Pulmonary/Chest: Effort normal and breath sounds normal.  Abdominal: Soft. Bowel sounds are normal.  Musculoskeletal:     Cervical back: Neck supple.  Skin: Skin is warm and dry.   Laboratory examination:   No results for input(s): NA, K, CL, CO2, GLUCOSE, BUN, CREATININE, CALCIUM, GFRNONAA, GFRAA in the last 8760 hours. CrCl cannot be calculated (Patient's most recent lab result is older than the maximum 21 days allowed.).  CMP Latest Ref Rng & Units 07/17/2012 06/21/2010 05/30/2010  Glucose 70 - 99 mg/dL 93 06/01/2010) 585(I)  BUN 6 - 23 mg/dL 13 11 12   Creatinine 0.50 - 1.10 mg/dL 778(E 4.23  Sodium 135 - 145 mEq/L 139 139 136  Potassium 3.5 - 5.1 mEq/L 4.0 3.9 3.4(L)  Chloride 96 - 112 mEq/L 103 104 102  CO2 19 - 32 mEq/L 28 30 28   Calcium 8.4 - 10.5 mg/dL 5.36 9.0 9.4  Total Protein 6.0 - 8.3 g/dL 7.4 - -  Total Bilirubin 0.3 - 1.2 mg/dL 1.44) - -  Alkaline Phos 39 - 117 U/L 78 - -  AST 0 - 37 U/L 17 - -  ALT 0 - 35 U/L 9 - -   CBC Latest Ref Rng & Units 07/17/2012 06/21/2010 05/30/2010  WBC 4.0 - 10.5 K/uL 5.3 7.5 6.7  Hemoglobin 12.0 - 15.0 g/dL 09/16/2012 08/21/2010) 11.8(L)  Hematocrit 36.0 - 46.0 % 39.1 28.8(L) 35.2(L)  Platelets 150 - 400 K/uL 243 207 227   Lipid Panel  No results found for: CHOL, TRIG, HDL, CHOLHDL, VLDL, LDLCALC, LDLDIRECT HEMOGLOBIN A1C No results found for: HGBA1C, MPG TSH No results for input(s): TSH in the last 8760 hours.  Labs  01/05/19  Cholesterol, total 105 HDL 66 01/05/2019 LDL 29 05/31/2016 Triglycerides 65.000 01/05/2019  Creatinine, Serum 0.500 MG/ 01/05/2019 Potassium 4.400 MM  05/31/2016 Magnesium N/D ALT (SGPT) 11.000 IU/ 01/05/2019 TSH 1.360 01/05/2019  Medications and allergies   Allergies  Allergen Reactions  . Codeine Itching  . Pine     Seasonal allergy     Current Outpatient Medications  Medication Instructions  . albuterol (PROVENTIL HFA;VENTOLIN HFA) 108 (90 Base) MCG/ACT inhaler 1-2 puffs, Inhalation, Every 6 hours PRN  . amitriptyline (ELAVIL) 150 mg, Daily at bedtime  . amLODipine (NORVASC) 10 mg, Daily  . aspirin 81 mg, Oral, Daily  . atorvastatin (LIPITOR) 20 mg, Daily at bedtime  . clopidogrel (PLAVIX) 75 mg, Oral, Daily  . estrogens (conjugated) (PREMARIN) 0.625 mg, Daily  . Fluticasone-Salmeterol (ADVAIR) 100-50 MCG/DOSE AEPB 1 puff, Every 12 hours  . levothyroxine (SYNTHROID) 150 mcg, Daily  . losartan (COZAAR) 50 mg, Daily  . pantoprazole (PROTONIX) 40  mg, Daily    Radiology:  No results found.  Cardiac Studies:   Peripheral arteriogram 05/29/2010: Right SFA 6.0 x 120, 6.0 x 60, 6.0 x 40 mm ever flex self-expanding stents.  Peripheral arteriogram 06/20/2010: Left SFA atherectomy with diamondback 1.5 mm bur followed by balloon angioplasty.  Lower extremity arterial duplex 01/26/2013: 1. No hemodynamically significant stenosis is identified on either side. This exam reveals mildly decreased perfusion of both the lower extremities, noted at the post tibial artery level. Bilateral ABI 0.86. 2. Study suggest patent bilateral SFA stents. Recheck in one year. No significant change since 01/26/2013.  Assessment     ICD-10-CM   1. Claudication in peripheral vascular disease (HCC)  I73.9 EKG 12-Lead    PCV ANKLE BRACHIAL INDEX (ABI)  2. 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 Right carotid bruit  R09.89 PCV CAROTID DUPLEX (BILATERAL)  4. Tobacco use disorder  F17.200   5. Essential hypertension  I10   6. Hypercholesteremia  E78.00     EKG 05/06/2019: Normal sinus rhythm at rate of 91 bpm, left atrial enlargement, normal axis.  Two  PVCs.  Low-voltage Axis.     No orders of the defined types were placed in this encounter.   Medications Discontinued During This Encounter  Medication Reason  . dextromethorphan 15 MG/5ML syrup Completed Course     Recommendations:   [name]  is a 74 y.o. African-American female with peripheral arterial disease, history of bilateral SFA angioplasty in 2012, essential hypertension, hyperlipidemia, tobacco use disorder referred back to me for evaluation of PAD.  Symptoms are clearly indicated above she will claudication with pain radiating all the way from the back up to her calf.  Her vascular examination is normal except for decreased pulses in the posterior tibial vessels. We'll obtain ABI.  She has a very prominent right carotid bruit, we'll obtain carotid artery duplex.  With regard to cardiovascular risk factors, blood pressure and lipids are very well controlled.  Smoking cessation was discussed, advised her that even 3 cigarettes a day will certainly heart vascular disease progression.  I'll like to see her back in 6 weeks both for smoking cessation and to follow-up on past her studies.  Yates Decamp, MD, [facility] 05/06/2019, 12:10 PM [facility] [place] Office: [phone]

## 2019-05-11 ENCOUNTER — Ambulatory Visit: Payer: Medicare HMO

## 2019-05-11 ENCOUNTER — Other Ambulatory Visit: Payer: Medicare HMO

## 2019-05-11 ENCOUNTER — Other Ambulatory Visit: Payer: Self-pay

## 2019-05-11 ENCOUNTER — Ambulatory Visit (INDEPENDENT_AMBULATORY_CARE_PROVIDER_SITE_OTHER): Payer: Medicare HMO

## 2019-05-11 DIAGNOSIS — I6523 Occlusion and stenosis of bilateral carotid arteries: Secondary | ICD-10-CM

## 2019-05-11 DIAGNOSIS — R0989 Other specified symptoms and signs involving the circulatory and respiratory systems: Secondary | ICD-10-CM

## 2019-05-11 DIAGNOSIS — I739 Peripheral vascular disease, unspecified: Secondary | ICD-10-CM

## 2019-05-15 ENCOUNTER — Telehealth: Payer: Self-pay

## 2019-05-15 NOTE — Telephone Encounter (Signed)
LMOVM informming patient of results.

## 2019-05-15 NOTE — Telephone Encounter (Signed)
-----   Message from Yates Decamp, MD sent at 05/14/2019 12:32 AM EST ----- Mild decrease in blood circulation to the legs but stable from 2014

## 2019-06-02 DIAGNOSIS — M5416 Radiculopathy, lumbar region: Secondary | ICD-10-CM | POA: Diagnosis not present

## 2019-06-02 DIAGNOSIS — E7800 Pure hypercholesterolemia, unspecified: Secondary | ICD-10-CM | POA: Diagnosis not present

## 2019-06-02 DIAGNOSIS — M1990 Unspecified osteoarthritis, unspecified site: Secondary | ICD-10-CM | POA: Diagnosis not present

## 2019-06-02 DIAGNOSIS — M25511 Pain in right shoulder: Secondary | ICD-10-CM | POA: Diagnosis not present

## 2019-06-02 DIAGNOSIS — E559 Vitamin D deficiency, unspecified: Secondary | ICD-10-CM | POA: Diagnosis not present

## 2019-06-02 DIAGNOSIS — M545 Low back pain: Secondary | ICD-10-CM | POA: Diagnosis not present

## 2019-06-02 DIAGNOSIS — L4050 Arthropathic psoriasis, unspecified: Secondary | ICD-10-CM | POA: Diagnosis not present

## 2019-06-02 DIAGNOSIS — M797 Fibromyalgia: Secondary | ICD-10-CM | POA: Diagnosis not present

## 2019-06-02 DIAGNOSIS — K219 Gastro-esophageal reflux disease without esophagitis: Secondary | ICD-10-CM | POA: Diagnosis not present

## 2019-06-02 DIAGNOSIS — I10 Essential (primary) hypertension: Secondary | ICD-10-CM | POA: Diagnosis not present

## 2019-06-19 ENCOUNTER — Encounter: Payer: Self-pay | Admitting: Cardiology

## 2019-06-19 ENCOUNTER — Ambulatory Visit: Payer: Medicare Other | Admitting: Cardiology

## 2019-06-19 ENCOUNTER — Other Ambulatory Visit: Payer: Self-pay

## 2019-06-19 ENCOUNTER — Ambulatory Visit: Payer: Medicare HMO | Admitting: Cardiology

## 2019-06-19 VITALS — BP 129/72 | HR 87 | Ht 67.0 in | Wt 151.0 lb

## 2019-06-19 DIAGNOSIS — I6523 Occlusion and stenosis of bilateral carotid arteries: Secondary | ICD-10-CM | POA: Diagnosis not present

## 2019-06-19 DIAGNOSIS — I739 Peripheral vascular disease, unspecified: Secondary | ICD-10-CM

## 2019-06-19 DIAGNOSIS — F17200 Nicotine dependence, unspecified, uncomplicated: Secondary | ICD-10-CM | POA: Diagnosis not present

## 2019-06-19 DIAGNOSIS — M48062 Spinal stenosis, lumbar region with neurogenic claudication: Secondary | ICD-10-CM | POA: Diagnosis not present

## 2019-06-19 DIAGNOSIS — I10 Essential (primary) hypertension: Secondary | ICD-10-CM | POA: Diagnosis not present

## 2019-06-19 NOTE — Progress Notes (Signed)
Primary Physician/Referring:  Jani Gravel, MD  Patient ID: [name], female    DOB: 04/28/1945, 74 y.o.   MRN: [id number]  Chief Complaint  Patient presents with  . Carotid  . Nicotine Dependence  . Follow-up    6 week   HPI:    [name]  is a 74 y.o. African-American female with peripheral arterial disease, history of bilateral SFA angioplasty in 2012, essential hypertension, hyperlipidemia, tobacco use disorder presents for evaluation of PAD. 2 - 3 months history of  low back pain that radiated into the legs.  Prior to this, was walking 1 hr and 45 min daily.  She had to stop this due to the pain.  Reports that the pain is worse in the morning right after waking, usually lasts about 10-15 minutes and then improves with getting up and moving around.  Pain does not occur after walking.    She was seen by a rheumatologist and told that she has arthritis in her back and legs. She is currently smoking 2 cigarettes a day and is interested in quitting.  She denies chest pain, SOB at rest, DOE, leg swelling, palpitations, orthopnea, PND. NO new symptoms, no ulceration.  Since last office visit a month ago she has quit smoking, she is presently using Chantix.  Past Medical History:  Diagnosis Date  . Arthritis    "all over" (07/25/2012)  . Asthma   . Environmental allergies   . Fibromyalgia   . GERD (gastroesophageal reflux disease)   . Hyperlipidemia   . Hypertension   . Hypothyroidism   . Peripheral vascular disease ([place])   . Post-menopausal    on HRT x 25 years   Past Surgical History:  Procedure Laterality Date  . ABDOMINAL HYSTERECTOMY  ~ 1977  . ANTERIOR CERVICAL DECOMPRESSION/DISCECTOMY FUSION 4 LEVELS N/A 07/23/2012   Procedure: ANTERIOR CERVICAL DECOMPRESSION/DISCECTOMY FUSION 3 LEVELS;  Surgeon: Sinclair Ship, MD;  Location: [place];  Service: Orthopedics;  Laterality: N/A;  Anterior cervical decompression fusion, cervical 3-4, cervical 4-5, cervical 5-6 with  instrumentation and allograft.  . FEMORAL ARTERY STENT Bilateral 2011  . POSTERIOR CERVICAL FUSION/FORAMINOTOMY N/A 07/24/2012   Procedure: POSTERIOR CERVICAL FUSION/FORAMINOTOMY LEVEL 5;  Surgeon: Sinclair Ship, MD;  Location: [place];  Service: Orthopedics;  Laterality: N/A;  Posterior cervical decompression, cervical 6-7, C7-T1, T1-T2. Cervical fusion cervical 3-4, cervical 4-5, cervical 5-6, cervical 6-7, cervical 7-T1 with allograft, local autograft.  Marland Kitchen POSTERIOR FUSION CERVICAL SPINE  07/24/2012   C3-T2 posterior fusion with instrumentation and allograft, C6-T2 decompression)   . TONSILLECTOMY  1960's?   Social History   Tobacco Use  . Smoking status: Former Smoker    Packs/day: 0.12    Years: 30.00    Pack years: 3.60    Types: Cigarettes    Quit date: 05/2019    Years since quitting: 0.0  . Smokeless tobacco: Never Used  . Tobacco comment: 07/25/2012 offered smoking cessation materials; pt declines  Substance Use Topics  . Alcohol use: Yes    Comment: 07/25/2012 "glass of wine maybe once a month; not often"    ROS  Review of Systems  Constitution: Negative for weight gain.  Cardiovascular: Positive for dyspnea on exertion. Negative for leg swelling and syncope.  Respiratory: Positive for wheezing (occasional). Negative for hemoptysis.   Endocrine: Negative for cold intolerance.  Hematologic/Lymphatic: Does not bruise/bleed easily.  Musculoskeletal: Positive for back pain and muscle cramps.  Gastrointestinal: Negative for hematochezia and melena.  Neurological:  Negative for headaches and light-headedness.   Objective  Blood pressure 129/72, pulse 87, height 5\' 7"  (1.702 m), weight 151 lb (68.5 kg), SpO2 99 %.  Vitals with BMI 06/19/2019 05/06/2019 05/31/2016  Height 5\' 7"  5' 7.25" 5\' 8"   Weight 151 lbs 149 lbs 152 lbs  BMI 23.64 23.17 23.2  Systolic 129 132 06/02/2016  Diastolic 72 70 81  Pulse 87 88 86     Physical Exam  Neck: No thyromegaly present.  Cardiovascular:  Normal rate, regular rhythm, intact distal pulses and normal pulses. Exam reveals no gallop.  Murmur heard.  Scratchy early systolic murmur is present with a grade of 2/6 at the upper right sternal border. Pulses:      Carotid pulses are on the right side with bruit. No leg edema, no JVD.  Except bilateral PT faint, normal lower extremity arterial pulse.    Pulmonary/Chest: Effort normal and breath sounds normal.  Abdominal: Soft. Bowel sounds are normal.  Musculoskeletal:     Cervical back: Neck supple.  Skin: Skin is warm and dry.   Laboratory examination:   No results for input(s): NA, K, CL, CO2, GLUCOSE, BUN, CREATININE, CALCIUM, GFRNONAA, GFRAA in the last 8760 hours. CrCl cannot be calculated (Patient's most recent lab result is older than the maximum 21 days allowed.).  CMP Latest Ref Rng & Units 07/17/2012 06/21/2010 05/30/2010  Glucose 70 - 99 mg/dL 93 09/16/2012) 08/21/2010)  BUN 6 - 23 mg/dL 13 11 12   Creatinine 0.50 - 1.10 mg/dL 06/01/2010 244(W 102(V  Sodium 135 - 145 mEq/L 139 139 136  Potassium 3.5 - 5.1 mEq/L 4.0 3.9 3.4(L)  Chloride 96 - 112 mEq/L 103 104 102  CO2 19 - 32 mEq/L 28 30 28   Calcium 8.4 - 10.5 mg/dL 9.0 9.4  Total Protein 6.0 - 8.3 g/dL 7.4 - -  Total Bilirubin 0.3 - 1.2 mg/dL 2.53) - -  Alkaline Phos 39 - 117 U/L 78 - -  AST 0 - 37 U/L 17 - -  ALT 0 - 35 U/L 9 - -   CBC Latest Ref Rng & Units 07/17/2012 06/21/2010 05/30/2010  WBC 4.0 - 10.5 K/uL 5.3 7.5 6.7  Hemoglobin 12.0 - 15.0 g/dL 47.4 2.5(Z) 11.8(L)  Hematocrit 36.0 - 46.0 % 39.1 28.8(L) 35.2(L)  Platelets 150 - 400 K/uL 243 207 227   Lipid Panel  No results found for: CHOL, TRIG, HDL, CHOLHDL, VLDL, LDLCALC, LDLDIRECT HEMOGLOBIN A1C No results found for: HGBA1C, MPG TSH No results for input(s): TSH in the last 8760 hours.  Labs  01/05/19  Cholesterol, total 105 HDL 66 01/05/2019 LDL 29 05/31/2016 Triglycerides 65.000 01/05/2019  Creatinine, Serum 0.500 MG/ 01/05/2019 Potassium 4.400 MM 05/31/2016 Magnesium  N/D ALT (SGPT) 11.000 IU/ 01/05/2019 TSH 1.360 01/05/2019  Medications and allergies   Allergies  Allergen Reactions  . Codeine Itching  . Pine     Seasonal allergy     Current Outpatient Medications  Medication Instructions  . amitriptyline (ELAVIL) 150 mg, Daily at bedtime  . amLODipine (NORVASC) 10 mg, Daily  . atorvastatin (LIPITOR) 20 mg, Daily at bedtime  . clopidogrel (PLAVIX) 75 mg, Oral, Daily  . estrogens (conjugated) (PREMARIN) 0.625 mg, Daily  . Fluticasone-Salmeterol (ADVAIR) 100-50 MCG/DOSE AEPB 1 puff, Every 12 hours  . levothyroxine (SYNTHROID) 150 mcg, Daily  . losartan (COZAAR) 50 mg, Daily  . methotrexate (RHEUMATREX) 2.5 MG tablet 6 tablets, Oral, Weekly  . naproxen (NAPROSYN) 500 MG tablet No dose, route, or frequency recorded.  01/07/2019  pantoprazole (PROTONIX) 40 mg, Daily  . varenicline (CHANTIX) 0.5 mg, Oral, 2 times daily    Radiology:  No results found.  Cardiac Studies:   Peripheral arteriogram 05/29/2010: Right SFA 6.0 x 120, 6.0 x 60, 6.0 x 40 mm ever flex self-expanding stents.  Peripheral arteriogram 06/20/2010: Left SFA atherectomy with diamondback 1.5 mm bur followed by balloon angioplasty.  Carotid artery duplex 05/11/2019:  Stenosis in the right internal carotid artery (16-49%). Stenosis in the  right external carotid artery (>50%).  Stenosis in the left internal carotid artery (50-69%).  Antegrade right vertebral artery flow. Antegrade left vertebral artery  flow.  Follow up in six months is appropriate if clinically indicated.  ABI 05/11/2019:  This exam reveals mildly decreased perfusion of the right and left lower  extremity, noted at the post tibial artery level (ABI 0.81). Multiphasic  waveform at the level of the ankles.  No significant change in ABI from 01/26/2013.  Assessment     ICD-10-CM   1. Claudication in peripheral vascular disease (HCC)  I73.9   2. 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 Bilateral carotid artery stenosis   I65.23   4. Essential hypertension  I10   5. Tobacco use disorder  F17.200     EKG 05/06/2019: Normal sinus rhythm at rate of 91 bpm, left atrial enlargement, normal axis.  Two PVCs.  Low-voltage Axis.     No orders of the defined types were placed in this encounter.   Medications Discontinued During This Encounter  Medication Reason  . albuterol (PROVENTIL HFA;VENTOLIN HFA) 108 (90 Base) MCG/ACT inhaler Error  . aspirin 81 MG chewable tablet Completed Course     Recommendations:   [name]  is a 75 y.o. African-American female with peripheral arterial disease with bilateral SFA angioplasty in 2012, essential hypertension, hyperlipidemia, tobacco use disorder presents for f/u of PAD.  As clinically suspected, her symptoms of leg pain are pseudoclaudication.  If orthopedic evaluation is negative, I would recommend performing a complete lower extremity arterial duplex in view of her symptoms.  However the ABI are very reassuring.  I discussed this with the patient and given her positive reinforcement, smoking and PAD and carotid disease discussed again.  I reviewed the carotid artery duplex, she has moderate disease in the left and mild disease on the right, she will need surveillance duplex in 6 months.  Blood pressure is well controlled, lipids are under good control, smoking cessation again discussed with the patient, since last office visit a month ago she has quit smoking and she is presently on Chantix.   Continue  Plavix alone. She prefers this over ASA alone.  Carrie Decamp, MD, [facility] 06/19/2019, 10:53 AM [facility] [place] Office: [phone]

## 2019-06-24 ENCOUNTER — Other Ambulatory Visit: Payer: Self-pay | Admitting: Cardiology

## 2019-06-24 DIAGNOSIS — I6523 Occlusion and stenosis of bilateral carotid arteries: Secondary | ICD-10-CM

## 2019-06-29 DIAGNOSIS — E039 Hypothyroidism, unspecified: Secondary | ICD-10-CM | POA: Diagnosis not present

## 2019-06-29 DIAGNOSIS — I10 Essential (primary) hypertension: Secondary | ICD-10-CM | POA: Diagnosis not present

## 2019-06-29 DIAGNOSIS — M5441 Lumbago with sciatica, right side: Secondary | ICD-10-CM | POA: Diagnosis not present

## 2019-06-29 DIAGNOSIS — R739 Hyperglycemia, unspecified: Secondary | ICD-10-CM | POA: Diagnosis not present

## 2019-07-14 DIAGNOSIS — M797 Fibromyalgia: Secondary | ICD-10-CM | POA: Diagnosis not present

## 2019-07-14 DIAGNOSIS — M5416 Radiculopathy, lumbar region: Secondary | ICD-10-CM | POA: Diagnosis not present

## 2019-07-14 DIAGNOSIS — M1990 Unspecified osteoarthritis, unspecified site: Secondary | ICD-10-CM | POA: Diagnosis not present

## 2019-07-14 DIAGNOSIS — K219 Gastro-esophageal reflux disease without esophagitis: Secondary | ICD-10-CM | POA: Diagnosis not present

## 2019-07-14 DIAGNOSIS — L4050 Arthropathic psoriasis, unspecified: Secondary | ICD-10-CM | POA: Diagnosis not present

## 2019-07-14 DIAGNOSIS — R21 Rash and other nonspecific skin eruption: Secondary | ICD-10-CM | POA: Diagnosis not present

## 2019-07-14 DIAGNOSIS — M25511 Pain in right shoulder: Secondary | ICD-10-CM | POA: Diagnosis not present

## 2019-07-14 DIAGNOSIS — Z79899 Other long term (current) drug therapy: Secondary | ICD-10-CM | POA: Diagnosis not present

## 2019-07-14 DIAGNOSIS — M545 Low back pain: Secondary | ICD-10-CM | POA: Diagnosis not present

## 2019-07-24 DIAGNOSIS — J45909 Unspecified asthma, uncomplicated: Secondary | ICD-10-CM | POA: Diagnosis not present

## 2019-07-24 DIAGNOSIS — K219 Gastro-esophageal reflux disease without esophagitis: Secondary | ICD-10-CM | POA: Diagnosis not present

## 2019-07-24 DIAGNOSIS — I739 Peripheral vascular disease, unspecified: Secondary | ICD-10-CM | POA: Diagnosis not present

## 2019-07-24 DIAGNOSIS — E039 Hypothyroidism, unspecified: Secondary | ICD-10-CM | POA: Diagnosis not present

## 2019-07-24 DIAGNOSIS — R739 Hyperglycemia, unspecified: Secondary | ICD-10-CM | POA: Diagnosis not present

## 2019-07-24 DIAGNOSIS — E559 Vitamin D deficiency, unspecified: Secondary | ICD-10-CM | POA: Diagnosis not present

## 2019-07-24 DIAGNOSIS — F17200 Nicotine dependence, unspecified, uncomplicated: Secondary | ICD-10-CM | POA: Diagnosis not present

## 2019-07-24 DIAGNOSIS — Z0000 Encounter for general adult medical examination without abnormal findings: Secondary | ICD-10-CM | POA: Diagnosis not present

## 2019-07-24 DIAGNOSIS — I10 Essential (primary) hypertension: Secondary | ICD-10-CM | POA: Diagnosis not present

## 2019-08-25 DIAGNOSIS — M5416 Radiculopathy, lumbar region: Secondary | ICD-10-CM | POA: Diagnosis not present

## 2019-08-25 DIAGNOSIS — M1990 Unspecified osteoarthritis, unspecified site: Secondary | ICD-10-CM | POA: Diagnosis not present

## 2019-08-25 DIAGNOSIS — M545 Low back pain: Secondary | ICD-10-CM | POA: Diagnosis not present

## 2019-08-25 DIAGNOSIS — M797 Fibromyalgia: Secondary | ICD-10-CM | POA: Diagnosis not present

## 2019-08-25 DIAGNOSIS — K219 Gastro-esophageal reflux disease without esophagitis: Secondary | ICD-10-CM | POA: Diagnosis not present

## 2019-08-25 DIAGNOSIS — Z79899 Other long term (current) drug therapy: Secondary | ICD-10-CM | POA: Diagnosis not present

## 2019-08-25 DIAGNOSIS — M25551 Pain in right hip: Secondary | ICD-10-CM | POA: Diagnosis not present

## 2019-08-25 DIAGNOSIS — L4050 Arthropathic psoriasis, unspecified: Secondary | ICD-10-CM | POA: Diagnosis not present

## 2019-08-25 DIAGNOSIS — R21 Rash and other nonspecific skin eruption: Secondary | ICD-10-CM | POA: Diagnosis not present

## 2019-09-11 DIAGNOSIS — L400 Psoriasis vulgaris: Secondary | ICD-10-CM | POA: Diagnosis not present

## 2019-09-11 DIAGNOSIS — L810 Postinflammatory hyperpigmentation: Secondary | ICD-10-CM | POA: Diagnosis not present

## 2019-10-14 DIAGNOSIS — H524 Presbyopia: Secondary | ICD-10-CM | POA: Diagnosis not present

## 2019-10-28 DIAGNOSIS — M25551 Pain in right hip: Secondary | ICD-10-CM | POA: Diagnosis not present

## 2019-10-28 DIAGNOSIS — Z79899 Other long term (current) drug therapy: Secondary | ICD-10-CM | POA: Diagnosis not present

## 2019-10-28 DIAGNOSIS — L4050 Arthropathic psoriasis, unspecified: Secondary | ICD-10-CM | POA: Diagnosis not present

## 2019-10-28 DIAGNOSIS — R600 Localized edema: Secondary | ICD-10-CM | POA: Diagnosis not present

## 2019-10-28 DIAGNOSIS — M5416 Radiculopathy, lumbar region: Secondary | ICD-10-CM | POA: Diagnosis not present

## 2019-10-28 DIAGNOSIS — K219 Gastro-esophageal reflux disease without esophagitis: Secondary | ICD-10-CM | POA: Diagnosis not present

## 2019-10-28 DIAGNOSIS — M1990 Unspecified osteoarthritis, unspecified site: Secondary | ICD-10-CM | POA: Diagnosis not present

## 2019-10-28 DIAGNOSIS — M797 Fibromyalgia: Secondary | ICD-10-CM | POA: Diagnosis not present

## 2019-10-28 DIAGNOSIS — M545 Low back pain: Secondary | ICD-10-CM | POA: Diagnosis not present

## 2019-10-29 ENCOUNTER — Ambulatory Visit (INDEPENDENT_AMBULATORY_CARE_PROVIDER_SITE_OTHER): Payer: Medicare HMO

## 2019-10-29 ENCOUNTER — Encounter: Payer: Self-pay | Admitting: Orthopaedic Surgery

## 2019-10-29 ENCOUNTER — Ambulatory Visit (INDEPENDENT_AMBULATORY_CARE_PROVIDER_SITE_OTHER): Payer: Medicare HMO | Admitting: Orthopaedic Surgery

## 2019-10-29 ENCOUNTER — Other Ambulatory Visit: Payer: Self-pay

## 2019-10-29 VITALS — Ht 67.0 in | Wt 150.0 lb

## 2019-10-29 DIAGNOSIS — G8929 Other chronic pain: Secondary | ICD-10-CM

## 2019-10-29 DIAGNOSIS — M170 Bilateral primary osteoarthritis of knee: Secondary | ICD-10-CM | POA: Diagnosis not present

## 2019-10-29 DIAGNOSIS — M25561 Pain in right knee: Secondary | ICD-10-CM

## 2019-10-29 MED ORDER — LIDOCAINE HCL 1 % IJ SOLN
2.0000 mL | INTRAMUSCULAR | Status: AC | PRN
  Administered 2019-10-29: 2 mL

## 2019-10-29 MED ORDER — BUPIVACAINE HCL 0.5 % IJ SOLN
2.0000 mL | INTRAMUSCULAR | Status: AC | PRN
  Administered 2019-10-29: 2 mL via INTRA_ARTICULAR

## 2019-10-29 MED ORDER — METHYLPREDNISOLONE ACETATE 40 MG/ML IJ SUSP
80.0000 mg | INTRAMUSCULAR | Status: AC | PRN
  Administered 2019-10-29: 80 mg via INTRA_ARTICULAR

## 2019-10-29 NOTE — Progress Notes (Signed)
Office Visit Note   Patient: [name]           Date of Birth: 1945/12/09           MRN: [id number] Visit Date: 10/29/2019              Requested by: Pearson Grippe, MD [address] [place],  [place] [address] PCP: Pearson Grippe, MD   Assessment & Plan: Visit Diagnoses:  1. Chronic pain of right knee   2. Bilateral primary osteoarthritis of knee     Plan: Primary osteoarthritis right knee.  Long discussion regarding diagnosis and treatment options.  She would like to try a cortisone injection.  Will perform and monitor response  Follow-Up Instructions: Return if symptoms worsen or fail to improve.   Orders:  Orders Placed This Encounter  Procedures  . Large Joint Inj: R knee  . XR KNEE 3 VIEW RIGHT   No orders of the defined types were placed in this encounter.     Procedures: Large Joint Inj: R knee on 10/29/2019 3:43 PM Indications: pain and diagnostic evaluation Details: 25 G 1.5 in needle, anterolateral approach  Arthrogram: No  Medications: 2 mL lidocaine 1 %; 2 mL bupivacaine 0.5 %; 80 mg methylPREDNISolone acetate 40 MG/ML Procedure, treatment alternatives, risks and benefits explained, specific risks discussed. Consent was given by the patient. Immediately prior to procedure a time out was called to verify the correct patient, procedure, equipment, support staff and site/side marked as required. Patient was prepped and draped in the usual sterile fashion.       Clinical Data: No additional findings.   Subjective: Chief Complaint  Patient presents with  . Right Knee - Pain  Patient presents today for right knee pain. She said that it has hurt for years. No known injury. She said that it swells. Her pain is all throughout her knee, and constantly the same. No grinding, catching, or giving way. No previous knee surgery. She takes Naprosyn if needed. No previous treatment.  No prior injury  HPI  Review of Systems  Constitutional: Negative for  fatigue.  HENT: Negative for ear pain.   Eyes: Negative for pain.  Respiratory: Negative for shortness of breath.   Cardiovascular: Negative for leg swelling.  Gastrointestinal: Positive for constipation. Negative for diarrhea.  Endocrine: Negative for cold intolerance and heat intolerance.  Genitourinary: Negative for difficulty urinating.  Musculoskeletal: Positive for joint swelling.  Skin: Negative for rash.  Allergic/Immunologic: Negative for food allergies.  Neurological: Negative for weakness.  Hematological: Does not bruise/bleed easily.  Psychiatric/Behavioral: Negative for sleep disturbance.     Objective: Vital Signs: Ht 5\' 7"  (1.702 m)   Wt 150 lb (68 kg)   BMI 23.49 kg/m   Physical Exam Constitutional:      Appearance: She is well-developed.  Eyes:     Pupils: Pupils are equal, round, and reactive to light.  Pulmonary:     Effort: Pulmonary effort is normal.  Skin:    General: Skin is warm and dry.  Neurological:     Mental Status: She is alert and oriented to person, place, and time.  Psychiatric:        Behavior: Behavior normal.     Ortho Exam awake alert and oriented x3.  Comfortable sitting.  Small effusion right knee.  Slight increased valgus.  Does have positive patella crepitation with mild pain with compression and also some medial lateral joint pain.  Full extension and flexed over 105  degrees without instability.  No calf pain.  No popliteal mass or pain.  Motor exam intact  Specialty Comments:  No specialty comments available.  Imaging: XR KNEE 3 VIEW RIGHT  Result Date: 10/29/2019 Films of the right knee were obtained in 3 projections standing.  There are peripheral osteophytes laterally with subchondral sclerosis and calcification within the menisci.  There are some degenerative changes about the patellofemoral joint and the medial compartment as well.  The joint spaces are still relatively well-maintained.  Films are consistent with moderate  osteoarthritis without significant deformity as well with CPPD    PMFS History: Patient Active Problem List   Diagnosis Date Noted  . Bilateral primary osteoarthritis of knee 10/29/2019   Past Medical History:  Diagnosis Date  . Arthritis    "all over" (07/25/2012)  . Asthma   . Environmental allergies   . Fibromyalgia   . GERD (gastroesophageal reflux disease)   . Hyperlipidemia   . Hypertension   . Hypothyroidism   . Peripheral vascular disease (HCC)   . Post-menopausal    on HRT x 25 years    Family History  Problem Relation Age of Onset  . Heart failure Mother   . Hypertension Father   . Hypertension Sister   . Hypertension Brother   . Hypertension Brother   . Heart disease Brother     Past Surgical History:  Procedure Laterality Date  . ABDOMINAL HYSTERECTOMY  ~ 1977  . ANTERIOR CERVICAL DECOMPRESSION/DISCECTOMY FUSION 4 LEVELS N/A 07/23/2012   Procedure: ANTERIOR CERVICAL DECOMPRESSION/DISCECTOMY FUSION 3 LEVELS;  Surgeon: Emilee Hero, MD;  Location: [facility] OR;  Service: Orthopedics;  Laterality: N/A;  Anterior cervical decompression fusion, cervical 3-4, cervical 4-5, cervical 5-6 with instrumentation and allograft.  . FEMORAL ARTERY STENT Bilateral 2011  . POSTERIOR CERVICAL FUSION/FORAMINOTOMY N/A 07/24/2012   Procedure: POSTERIOR CERVICAL FUSION/FORAMINOTOMY LEVEL 5;  Surgeon: Emilee Hero, MD;  Location: [facility] OR;  Service: Orthopedics;  Laterality: N/A;  Posterior cervical decompression, cervical 6-7, C7-T1, T1-T2. Cervical fusion cervical 3-4, cervical 4-5, cervical 5-6, cervical 6-7, cervical 7-T1 with allograft, local autograft.  Marland Kitchen POSTERIOR FUSION CERVICAL SPINE  07/24/2012   C3-T2 posterior fusion with instrumentation and allograft, C6-T2 decompression)   . TONSILLECTOMY  1960's?   Social History   Occupational History  . Not on file  Tobacco Use  . Smoking status: Former Smoker    Packs/day: 0.12    Years: 30.00    Pack years: 3.60     Types: Cigarettes    Quit date: 05/2019    Years since quitting: 0.4  . Smokeless tobacco: Never Used  . Tobacco comment: 07/25/2012 offered smoking cessation materials; pt declines  Vaping Use  . Vaping Use: Never used  Substance and Sexual Activity  . Alcohol use: Yes    Comment: 07/25/2012 "glass of wine maybe once a month; not often"  . Drug use: No  . Sexual activity: Yes

## 2019-11-02 DIAGNOSIS — Z1231 Encounter for screening mammogram for malignant neoplasm of breast: Secondary | ICD-10-CM | POA: Diagnosis not present

## 2019-12-08 DIAGNOSIS — R921 Mammographic calcification found on diagnostic imaging of breast: Secondary | ICD-10-CM | POA: Diagnosis not present

## 2019-12-22 ENCOUNTER — Other Ambulatory Visit: Payer: Self-pay

## 2019-12-22 ENCOUNTER — Ambulatory Visit: Payer: PRIVATE HEALTH INSURANCE

## 2019-12-22 DIAGNOSIS — I6523 Occlusion and stenosis of bilateral carotid arteries: Secondary | ICD-10-CM

## 2019-12-24 ENCOUNTER — Other Ambulatory Visit: Payer: Self-pay | Admitting: Cardiology

## 2019-12-24 DIAGNOSIS — I6523 Occlusion and stenosis of bilateral carotid arteries: Secondary | ICD-10-CM

## 2019-12-29 ENCOUNTER — Ambulatory Visit: Payer: PRIVATE HEALTH INSURANCE | Attending: Internal Medicine

## 2019-12-29 DIAGNOSIS — Z23 Encounter for immunization: Secondary | ICD-10-CM

## 2019-12-29 NOTE — Progress Notes (Signed)
Called patient, discussed carotid duplex results with her. I transferred call to front desk to schedule carotid duplex OV.

## 2019-12-29 NOTE — Progress Notes (Signed)
   Covid-19 Vaccination Clinic  Name:  [name]    MRN: [id number] DOB: 1945-12-19  12/29/2019  [name] was observed post Covid-19 immunization for 15 minutes without incident. She was provided with Vaccine Information Sheet and instruction to access the V-Safe system.   [name] was instructed to call 911 with any severe reactions post vaccine: Marland Kitchen Difficulty breathing  . Swelling of face and throat  . A fast heartbeat  . A bad rash all over body  . Dizziness and weakness

## 2020-01-18 DIAGNOSIS — I10 Essential (primary) hypertension: Secondary | ICD-10-CM | POA: Diagnosis not present

## 2020-01-18 DIAGNOSIS — E7800 Pure hypercholesterolemia, unspecified: Secondary | ICD-10-CM | POA: Diagnosis not present

## 2020-01-18 DIAGNOSIS — R739 Hyperglycemia, unspecified: Secondary | ICD-10-CM | POA: Diagnosis not present

## 2020-01-18 DIAGNOSIS — E039 Hypothyroidism, unspecified: Secondary | ICD-10-CM | POA: Diagnosis not present

## 2020-01-18 DIAGNOSIS — E559 Vitamin D deficiency, unspecified: Secondary | ICD-10-CM | POA: Diagnosis not present

## 2020-01-25 DIAGNOSIS — I739 Peripheral vascular disease, unspecified: Secondary | ICD-10-CM | POA: Diagnosis not present

## 2020-01-25 DIAGNOSIS — M5441 Lumbago with sciatica, right side: Secondary | ICD-10-CM | POA: Diagnosis not present

## 2020-01-25 DIAGNOSIS — J45909 Unspecified asthma, uncomplicated: Secondary | ICD-10-CM | POA: Diagnosis not present

## 2020-01-25 DIAGNOSIS — R739 Hyperglycemia, unspecified: Secondary | ICD-10-CM | POA: Diagnosis not present

## 2020-01-25 DIAGNOSIS — I10 Essential (primary) hypertension: Secondary | ICD-10-CM | POA: Diagnosis not present

## 2020-01-25 DIAGNOSIS — M549 Dorsalgia, unspecified: Secondary | ICD-10-CM | POA: Diagnosis not present

## 2020-01-25 DIAGNOSIS — E559 Vitamin D deficiency, unspecified: Secondary | ICD-10-CM | POA: Diagnosis not present

## 2020-01-25 DIAGNOSIS — E039 Hypothyroidism, unspecified: Secondary | ICD-10-CM | POA: Diagnosis not present

## 2020-01-25 DIAGNOSIS — K219 Gastro-esophageal reflux disease without esophagitis: Secondary | ICD-10-CM | POA: Diagnosis not present

## 2020-02-16 DIAGNOSIS — I739 Peripheral vascular disease, unspecified: Secondary | ICD-10-CM | POA: Diagnosis not present

## 2020-02-16 DIAGNOSIS — E7800 Pure hypercholesterolemia, unspecified: Secondary | ICD-10-CM | POA: Diagnosis not present

## 2020-02-16 DIAGNOSIS — I10 Essential (primary) hypertension: Secondary | ICD-10-CM | POA: Diagnosis not present

## 2020-02-29 DIAGNOSIS — M1990 Unspecified osteoarthritis, unspecified site: Secondary | ICD-10-CM | POA: Diagnosis not present

## 2020-02-29 DIAGNOSIS — M25511 Pain in right shoulder: Secondary | ICD-10-CM | POA: Diagnosis not present

## 2020-02-29 DIAGNOSIS — M5459 Other low back pain: Secondary | ICD-10-CM | POA: Diagnosis not present

## 2020-02-29 DIAGNOSIS — Z79899 Other long term (current) drug therapy: Secondary | ICD-10-CM | POA: Diagnosis not present

## 2020-02-29 DIAGNOSIS — R600 Localized edema: Secondary | ICD-10-CM | POA: Diagnosis not present

## 2020-02-29 DIAGNOSIS — K219 Gastro-esophageal reflux disease without esophagitis: Secondary | ICD-10-CM | POA: Diagnosis not present

## 2020-02-29 DIAGNOSIS — M19012 Primary osteoarthritis, left shoulder: Secondary | ICD-10-CM | POA: Diagnosis not present

## 2020-02-29 DIAGNOSIS — M19011 Primary osteoarthritis, right shoulder: Secondary | ICD-10-CM | POA: Diagnosis not present

## 2020-02-29 DIAGNOSIS — M25512 Pain in left shoulder: Secondary | ICD-10-CM | POA: Diagnosis not present

## 2020-02-29 DIAGNOSIS — L4050 Arthropathic psoriasis, unspecified: Secondary | ICD-10-CM | POA: Diagnosis not present

## 2020-02-29 DIAGNOSIS — M797 Fibromyalgia: Secondary | ICD-10-CM | POA: Diagnosis not present

## 2020-02-29 DIAGNOSIS — S43101A Unspecified dislocation of right acromioclavicular joint, initial encounter: Secondary | ICD-10-CM | POA: Diagnosis not present

## 2020-03-31 DIAGNOSIS — M25531 Pain in right wrist: Secondary | ICD-10-CM | POA: Diagnosis not present

## 2020-03-31 DIAGNOSIS — M5459 Other low back pain: Secondary | ICD-10-CM | POA: Diagnosis not present

## 2020-03-31 DIAGNOSIS — M25511 Pain in right shoulder: Secondary | ICD-10-CM | POA: Diagnosis not present

## 2020-03-31 DIAGNOSIS — K219 Gastro-esophageal reflux disease without esophagitis: Secondary | ICD-10-CM | POA: Diagnosis not present

## 2020-03-31 DIAGNOSIS — L4050 Arthropathic psoriasis, unspecified: Secondary | ICD-10-CM | POA: Diagnosis not present

## 2020-03-31 DIAGNOSIS — R600 Localized edema: Secondary | ICD-10-CM | POA: Diagnosis not present

## 2020-03-31 DIAGNOSIS — M1990 Unspecified osteoarthritis, unspecified site: Secondary | ICD-10-CM | POA: Diagnosis not present

## 2020-03-31 DIAGNOSIS — M797 Fibromyalgia: Secondary | ICD-10-CM | POA: Diagnosis not present

## 2020-03-31 DIAGNOSIS — Z79899 Other long term (current) drug therapy: Secondary | ICD-10-CM | POA: Diagnosis not present

## 2020-05-31 DIAGNOSIS — Z79899 Other long term (current) drug therapy: Secondary | ICD-10-CM | POA: Diagnosis not present

## 2020-05-31 DIAGNOSIS — L4050 Arthropathic psoriasis, unspecified: Secondary | ICD-10-CM | POA: Diagnosis not present

## 2020-06-09 ENCOUNTER — Other Ambulatory Visit: Payer: PRIVATE HEALTH INSURANCE

## 2020-06-20 ENCOUNTER — Other Ambulatory Visit: Payer: Self-pay

## 2020-06-20 ENCOUNTER — Ambulatory Visit: Payer: Medicare HMO | Admitting: Student

## 2020-06-20 ENCOUNTER — Encounter: Payer: Self-pay | Admitting: Student

## 2020-06-20 VITALS — BP 132/82 | HR 89 | Temp 98.3°F | Resp 16 | Ht 67.0 in | Wt 152.0 lb

## 2020-06-20 DIAGNOSIS — I739 Peripheral vascular disease, unspecified: Secondary | ICD-10-CM

## 2020-06-20 DIAGNOSIS — F17210 Nicotine dependence, cigarettes, uncomplicated: Secondary | ICD-10-CM | POA: Diagnosis not present

## 2020-06-20 DIAGNOSIS — I6523 Occlusion and stenosis of bilateral carotid arteries: Secondary | ICD-10-CM | POA: Diagnosis not present

## 2020-06-20 DIAGNOSIS — E7800 Pure hypercholesterolemia, unspecified: Secondary | ICD-10-CM

## 2020-06-20 NOTE — Progress Notes (Signed)
Primary Physician/Referring:  Jani Gravel, MD  Patient ID: [name], female    DOB: Oct 25, 1945, 75 y.o.   MRN: [id number]  Chief Complaint  Patient presents with   PAD   Carotid Disease   Follow-up    1 year   HPI:    [name]  is a 75 y.o. African-American female with peripheral arterial disease, history of bilateral SFA angioplasty in 2012, essential hypertension, hyperlipidemia, tobacco use disorder presents for evaluation of PAD.   Patient presents for annual follow-up of PAD and carotid artery disease. Patient is doing well overall. Denies chest pain, dyspnea, palpitations, leg swelling, orthopnea, PND, syncope, near syncope. She continues to have pain in there legs while walking, primarily in her left hip and knees. She follows with orthopedics and rheumatology for management of arthritis in her knees bilaterally as well as in her back. Patient reports her leg pain is worse in the cold and when she first gets out of bed in the morning, suggestive of pain secondary to arthritis.   Unfortunately she continues to smoke approximately 2 cigarettes per day. She walks 3-5 days per week for approximately 1 hour without issue. States her knee and leg pain improve with movement.   Past Medical History:  Diagnosis Date   Arthritis    "all over" (07/25/2012)   Asthma    Environmental allergies    Fibromyalgia    GERD (gastroesophageal reflux disease)    Hyperlipidemia    Hypertension    Hypothyroidism    Peripheral vascular disease ([place])    Post-menopausal    on HRT x 25 years   Past Surgical History:  Procedure Laterality Date   ABDOMINAL HYSTERECTOMY  ~ 1977   ANTERIOR CERVICAL DECOMPRESSION/DISCECTOMY FUSION 4 LEVELS N/A 07/23/2012   Procedure: ANTERIOR CERVICAL DECOMPRESSION/DISCECTOMY FUSION 3 LEVELS;  Surgeon: Sinclair Ship, MD;  Location: [facility];  Service: Orthopedics;  Laterality: N/A;  Anterior cervical decompression fusion, cervical 3-4,  cervical 4-5, cervical 5-6 with instrumentation and allograft.   FEMORAL ARTERY STENT Bilateral 2011   POSTERIOR CERVICAL FUSION/FORAMINOTOMY N/A 07/24/2012   Procedure: POSTERIOR CERVICAL FUSION/FORAMINOTOMY LEVEL 5;  Surgeon: Sinclair Ship, MD;  Location: [facility];  Service: Orthopedics;  Laterality: N/A;  Posterior cervical decompression, cervical 6-7, C7-T1, T1-T2. Cervical fusion cervical 3-4, cervical 4-5, cervical 5-6, cervical 6-7, cervical 7-T1 with allograft, local autograft.   POSTERIOR FUSION CERVICAL SPINE  07/24/2012   C3-T2 posterior fusion with instrumentation and allograft, C6-T2 decompression)    TONSILLECTOMY  1960's?   Social History   Tobacco Use   Smoking status: Former Smoker    Packs/day: 0.12    Years: 30.00    Pack years: 3.60    Types: Cigarettes    Quit date: 05/2019    Years since quitting: 1.0   Smokeless tobacco: Never Used   Tobacco comment: 07/25/2012 offered smoking cessation materials; pt declines  Substance Use Topics   Alcohol use: Yes    Comment: 07/25/2012 "glass of wine maybe once a month; not often"    ROS  Review of Systems  Constitutional: Negative for malaise/fatigue and weight gain.  Cardiovascular: Positive for dyspnea on exertion (improvved). Negative for chest pain, claudication, leg swelling, near-syncope, orthopnea, palpitations, paroxysmal nocturnal dyspnea and syncope.  Respiratory: Positive for wheezing (occasional). Negative for hemoptysis and shortness of breath.   Endocrine: Negative for cold intolerance.  Hematologic/Lymphatic: Does not bruise/bleed easily.  Musculoskeletal: Positive for arthritis, back pain, joint pain (bilateraly knees, left hip) and  muscle cramps (at night).  Gastrointestinal: Negative for hematochezia and melena.  Neurological: Negative for dizziness, headaches, light-headedness and weakness.   Objective  Blood pressure 132/82, pulse 89, temperature 98.3 F (36.8 C), resp. rate 16, height 5'  7" (1.702 m), weight 152 lb (68.9 kg), SpO2 100 %.  Vitals with BMI 06/20/2020 10/29/2019 06/19/2019  Height 5' 7"  5' 7"  5' 7"   Weight 152 lbs 150 lbs 151 lbs  BMI 23.8 25.00 37.04  Systolic 888 - 916  Diastolic 82 - 72  Pulse 89 - 87     Physical Exam Neck:     Thyroid: No thyromegaly.  Cardiovascular:     Rate and Rhythm: Normal rate and regular rhythm.     Pulses: Intact distal pulses.          Carotid pulses are 2+ on the right side with bruit and 2+ on the left side.      Radial pulses are 2+ on the right side and 2+ on the left side.       Femoral pulses are 2+ on the right side and 2+ on the left side.      Popliteal pulses are 1+ on the right side and 1+ on the left side.       Dorsalis pedis pulses are 1+ on the right side and 1+ on the left side.       Posterior tibial pulses are 0 on the right side and 0 on the left side.     Heart sounds: Murmur heard.   Scratchy early systolic murmur is present with a grade of 2/6 at the upper right sternal border. No gallop.      Comments: No leg edema, no JVD.  No ulceration.  Pulmonary:     Effort: Pulmonary effort is normal.     Breath sounds: Normal breath sounds.  Abdominal:     General: Bowel sounds are normal.     Palpations: Abdomen is soft.  Musculoskeletal:     Cervical back: Neck supple.     Right lower leg: No edema.     Left lower leg: No edema.  Skin:    General: Skin is warm and dry.     Capillary Refill: Capillary refill takes less than 2 seconds.  Neurological:     General: No focal deficit present.     Mental Status: She is oriented to person, place, and time.    Laboratory examination:   No results for input(s): NA, K, CL, CO2, GLUCOSE, BUN, CREATININE, CALCIUM, GFRNONAA, GFRAA in the last 8760 hours. CrCl cannot be calculated (Patient's most recent lab result is older than the maximum 21 days allowed.).  CMP Latest Ref Rng & Units 07/17/2012 06/21/2010 05/30/2010  Glucose 70 - 99 mg/dL 93 100(H) 116(H)  BUN 6  - 23 mg/dL 13 11 12   Creatinine 0.50 - 1.10 mg/dL 0.53 0.48 0.58  Sodium 135 - 145 mEq/L 139 139 136  Potassium 3.5 - 5.1 mEq/L 4.0 3.9 3.4(L)  Chloride 96 - 112 mEq/L 103 104 102  CO2 19 - 32 mEq/L 28 30 28   Calcium 8.4 - 10.5 mg/dL 10.2 9.0 9.4  Total Protein 6.0 - 8.3 g/dL 7.4 - -  Total Bilirubin 0.3 - 1.2 mg/dL 0.2(L) - -  Alkaline Phos 39 - 117 U/L 78 - -  AST 0 - 37 U/L 17 - -  ALT 0 - 35 U/L 9 - -   CBC Latest Ref Rng & Units 07/17/2012 06/21/2010 05/30/2010  WBC 4.0 - 10.5 K/uL 5.3 7.5 6.7  Hemoglobin 12.0 - 15.0 g/dL 13.4 9.7(L) 11.8(L)  Hematocrit 36.0 - 46.0 % 39.1 28.8(L) 35.2(L)  Platelets 150 - 400 K/uL 243 207 227   Lipid Panel  No results found for: CHOL, TRIG, HDL, CHOLHDL, VLDL, LDLCALC, LDLDIRECT HEMOGLOBIN A1C No results found for: HGBA1C, MPG TSH No results for input(s): TSH in the last 8760 hours.  05/31/2020: Hemoglobin 13.3, hematocrit 39.6, MCV 91.2, platelets 291 Glucose 103, BUN 11, creatinine 0.69, GFR 86, sodium 140, potassium 3.9, AST 14, ALT 7 alk phos 83  Labs  01/05/19  Cholesterol, total 105 HDL 66 01/05/2019 LDL 29 05/31/2016 Triglycerides 65.000 01/05/2019  Creatinine, Serum 0.500 MG/ 01/05/2019 Potassium 4.400 MM 05/31/2016 Magnesium N/D ALT (SGPT) 11.000 IU/ 01/05/2019 TSH 1.360 01/05/2019  Medications and allergies   Allergies  Allergen Reactions   Linaclotide Other (See Comments)   Naloxegol Other (See Comments)   Pine     Seasonal allergy   Codeine Itching     Current Outpatient Medications  Medication Instructions   amitriptyline (ELAVIL) 150 mg, Daily at bedtime   amLODipine (NORVASC) 10 mg, Daily   atorvastatin (LIPITOR) 20 mg, Daily at bedtime   Cholecalciferol 25 MCG (1000 UT) capsule 1 capsule, Oral, Daily   Cinnamon 500 MG capsule 1 capsule, Oral, Daily   clopidogrel (PLAVIX) 75 mg, Oral, Daily   estrogens (conjugated) (PREMARIN) 0.625 mg, Oral, Daily, Take daily for 21 days then do not take for 7 days.     Evolocumab (REPATHA SURECLICK) 546 MG/ML SOAJ 1 mL, Oral, Every 14 days   Fluticasone-Salmeterol (ADVAIR) 100-50 MCG/DOSE AEPB 1 puff, Every 12 hours   levothyroxine (SYNTHROID) 150 mcg, Daily   losartan (COZAAR) 50 mg, Daily   methotrexate (RHEUMATREX) 2.5 MG tablet 6 tablets, Weekly   naproxen (NAPROSYN) 500 MG tablet No dose, route, or frequency recorded.   pantoprazole (PROTONIX) 40 mg, Daily   tiZANidine (ZANAFLEX) 4 mg, Oral, As needed   traMADol (ULTRAM) 50 MG tablet 1 tablet, Oral, As needed    Radiology:  No results found.  Cardiac Studies:   Peripheral arteriogram 05/29/2010: Right SFA 6.0 x 120, 6.0 x 60, 6.0 x 40 mm ever flex self-expanding stents.  Peripheral arteriogram 06/20/2010: Left SFA atherectomy with diamondback 1.5 mm bur followed by balloon angioplasty.  ABI 05/11/2019:  This exam reveals mildly decreased perfusion of the right and left lower extremity, noted at the post tibial artery level (ABI 0.81). Multiphasic waveform at the level of the ankles.  No significant change in ABI from 01/26/2013.  Carotid artery duplex 12/22/2019:  Stenosis in the right internal carotid artery (50-69%). Stenosis in the right external carotid artery (<50%).  Stenosis in the left internal carotid artery (50-69%). Stenosis in the left external carotid artery (<50%).  Antegrade right vertebral artery flow. Antegrade left vertebral artery flow.  Follow up in six months is appropriate if clinically indicated. Compared to 05/11/2019, right ICA stenosis mildly progressed.   EKG   EKG 06/20/2020: Sinus arrhythmia at a rate of 82 bpm.  Left atrial enlargement normal axis.  Poor R wave progression, cannot exclude anteroseptal infarct old.  Low voltage complexes, consider pulmonary disease pattern.  EKG 05/06/2019: Normal sinus rhythm at rate of 91 bpm, left atrial enlargement, normal axis.  Two PVCs.  Low-voltage Axis.  Assessment     ICD-10-CM   1. Bilateral carotid artery  stenosis  I65.23 EKG 12-Lead    Lipid Panel With LDL/HDL Ratio  2. Claudication  in peripheral vascular disease (HCC)  I73.9 EKG 12-Lead  3. Hypercholesteremia  E78.00 Lipid Panel With LDL/HDL Ratio        No orders of the defined types were placed in this encounter.   Medications Discontinued During This Encounter  Medication Reason   varenicline (CHANTIX) 0.5 MG tablet Completed Course     Recommendations:   [name]  is a 75 y.o. African-American female with peripheral arterial disease with bilateral SFA angioplasty in 2012, essential hypertension, hyperlipidemia, tobacco use disorder.   Patient presents for annual follow up of PAD and carotid artery disease. Patient is doing well overall, without anginal symptoms. 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 She is now being followed by orthopedics and rheumatology for management of osteoarthritis and rheumatoid arthritis. Past ABI was reassuring as it showed only mildly reduced perfusion to bilateral lower extremities. Vascular exam remains stable compared to previous.  We will continue Plavix, as patient prefers this to taking aspirin.  In regard to carotid artery disease, reviewed and discussed with patient regarding results of recent carotid artery duplex, she verbalized understanding. She is schedule for surveillance scan later this month, will call her with results. No recent lipid panel for review, will obtain lipid profile testing at this time.   Follow up in 1 year, sooner if needed, for PAD and CAD.    Alethia Berthold, PA-C 06/21/2020, 10:58 AM Office: [phone]

## 2020-06-28 ENCOUNTER — Other Ambulatory Visit: Payer: Self-pay

## 2020-06-28 ENCOUNTER — Ambulatory Visit: Payer: PRIVATE HEALTH INSURANCE

## 2020-06-28 DIAGNOSIS — I6523 Occlusion and stenosis of bilateral carotid arteries: Secondary | ICD-10-CM | POA: Diagnosis not present

## 2020-07-04 NOTE — Progress Notes (Signed)
Please review, please order recheck in 6 months.

## 2020-07-05 ENCOUNTER — Telehealth: Payer: Self-pay

## 2020-07-05 NOTE — Telephone Encounter (Signed)
-----   Message from Rayford Halsted, [place] sent at 07/05/2020 10:59 AM EDT ----- Please inform patient carotid artery stenosis is stable compared to previous.

## 2020-07-05 NOTE — Progress Notes (Signed)
Please inform patient carotid artery stenosis is stable compared to previous.

## 2020-07-13 DIAGNOSIS — I6523 Occlusion and stenosis of bilateral carotid arteries: Secondary | ICD-10-CM | POA: Diagnosis not present

## 2020-07-13 DIAGNOSIS — E7800 Pure hypercholesterolemia, unspecified: Secondary | ICD-10-CM | POA: Diagnosis not present

## 2020-07-14 LAB — LIPID PANEL WITH LDL/HDL RATIO
Cholesterol, Total: 104 mg/dL (ref 100–199)
HDL: 76 mg/dL (ref >39)
LDL Chol Calc (NIH): 15 mg/dL (ref 0–99)
LDL/HDL Ratio: 0.2 ratio (ref 0.0–3.2)
Triglycerides: 59 mg/dL (ref 0–149)
VLDL Cholesterol Cal: 13 mg/dL (ref 5–40)

## 2020-07-14 NOTE — Progress Notes (Signed)
Called and spoke with patient regarding her lab results.

## 2020-07-14 NOTE — Progress Notes (Signed)
Attempted to call pt, no answer. Left vm requesting call back.

## 2020-07-14 NOTE — Progress Notes (Signed)
Please inform pt her lipids are well controlled.

## 2020-07-18 DIAGNOSIS — E039 Hypothyroidism, unspecified: Secondary | ICD-10-CM | POA: Diagnosis not present

## 2020-07-18 DIAGNOSIS — E7800 Pure hypercholesterolemia, unspecified: Secondary | ICD-10-CM | POA: Diagnosis not present

## 2020-07-18 DIAGNOSIS — R739 Hyperglycemia, unspecified: Secondary | ICD-10-CM | POA: Diagnosis not present

## 2020-07-25 DIAGNOSIS — I6523 Occlusion and stenosis of bilateral carotid arteries: Secondary | ICD-10-CM | POA: Diagnosis not present

## 2020-07-25 DIAGNOSIS — J449 Chronic obstructive pulmonary disease, unspecified: Secondary | ICD-10-CM | POA: Diagnosis not present

## 2020-07-25 DIAGNOSIS — R7303 Prediabetes: Secondary | ICD-10-CM | POA: Diagnosis not present

## 2020-07-25 DIAGNOSIS — L4050 Arthropathic psoriasis, unspecified: Secondary | ICD-10-CM | POA: Diagnosis not present

## 2020-07-25 DIAGNOSIS — J40 Bronchitis, not specified as acute or chronic: Secondary | ICD-10-CM | POA: Diagnosis not present

## 2020-07-25 DIAGNOSIS — K219 Gastro-esophageal reflux disease without esophagitis: Secondary | ICD-10-CM | POA: Diagnosis not present

## 2020-07-25 DIAGNOSIS — F17200 Nicotine dependence, unspecified, uncomplicated: Secondary | ICD-10-CM | POA: Diagnosis not present

## 2020-07-25 DIAGNOSIS — I739 Peripheral vascular disease, unspecified: Secondary | ICD-10-CM | POA: Diagnosis not present

## 2020-07-25 DIAGNOSIS — Z0000 Encounter for general adult medical examination without abnormal findings: Secondary | ICD-10-CM | POA: Diagnosis not present

## 2020-07-25 DIAGNOSIS — E7800 Pure hypercholesterolemia, unspecified: Secondary | ICD-10-CM | POA: Diagnosis not present

## 2020-08-10 DIAGNOSIS — M25511 Pain in right shoulder: Secondary | ICD-10-CM | POA: Diagnosis not present

## 2020-08-10 DIAGNOSIS — R600 Localized edema: Secondary | ICD-10-CM | POA: Diagnosis not present

## 2020-08-10 DIAGNOSIS — M5459 Other low back pain: Secondary | ICD-10-CM | POA: Diagnosis not present

## 2020-08-10 DIAGNOSIS — K219 Gastro-esophageal reflux disease without esophagitis: Secondary | ICD-10-CM | POA: Diagnosis not present

## 2020-08-10 DIAGNOSIS — L4050 Arthropathic psoriasis, unspecified: Secondary | ICD-10-CM | POA: Diagnosis not present

## 2020-08-10 DIAGNOSIS — M797 Fibromyalgia: Secondary | ICD-10-CM | POA: Diagnosis not present

## 2020-08-10 DIAGNOSIS — M1990 Unspecified osteoarthritis, unspecified site: Secondary | ICD-10-CM | POA: Diagnosis not present

## 2020-08-10 DIAGNOSIS — Z79899 Other long term (current) drug therapy: Secondary | ICD-10-CM | POA: Diagnosis not present

## 2020-08-15 DIAGNOSIS — Z0100 Encounter for examination of eyes and vision without abnormal findings: Secondary | ICD-10-CM | POA: Diagnosis not present

## 2020-11-14 DIAGNOSIS — L4050 Arthropathic psoriasis, unspecified: Secondary | ICD-10-CM | POA: Diagnosis not present

## 2020-11-14 DIAGNOSIS — R600 Localized edema: Secondary | ICD-10-CM | POA: Diagnosis not present

## 2020-11-14 DIAGNOSIS — M25511 Pain in right shoulder: Secondary | ICD-10-CM | POA: Diagnosis not present

## 2020-11-14 DIAGNOSIS — M797 Fibromyalgia: Secondary | ICD-10-CM | POA: Diagnosis not present

## 2020-11-14 DIAGNOSIS — Z79899 Other long term (current) drug therapy: Secondary | ICD-10-CM | POA: Diagnosis not present

## 2020-11-14 DIAGNOSIS — K219 Gastro-esophageal reflux disease without esophagitis: Secondary | ICD-10-CM | POA: Diagnosis not present

## 2020-11-14 DIAGNOSIS — M1990 Unspecified osteoarthritis, unspecified site: Secondary | ICD-10-CM | POA: Diagnosis not present

## 2020-11-14 DIAGNOSIS — M5459 Other low back pain: Secondary | ICD-10-CM | POA: Diagnosis not present

## 2020-11-21 DIAGNOSIS — Z1231 Encounter for screening mammogram for malignant neoplasm of breast: Secondary | ICD-10-CM | POA: Diagnosis not present

## 2020-11-30 ENCOUNTER — Ambulatory Visit (INDEPENDENT_AMBULATORY_CARE_PROVIDER_SITE_OTHER): Payer: Medicare HMO | Admitting: Orthopaedic Surgery

## 2020-11-30 ENCOUNTER — Other Ambulatory Visit: Payer: Self-pay

## 2020-11-30 ENCOUNTER — Encounter: Payer: Self-pay | Admitting: Orthopaedic Surgery

## 2020-11-30 ENCOUNTER — Ambulatory Visit (INDEPENDENT_AMBULATORY_CARE_PROVIDER_SITE_OTHER): Payer: Medicare HMO

## 2020-11-30 DIAGNOSIS — M25561 Pain in right knee: Secondary | ICD-10-CM | POA: Diagnosis not present

## 2020-11-30 DIAGNOSIS — M1711 Unilateral primary osteoarthritis, right knee: Secondary | ICD-10-CM | POA: Diagnosis not present

## 2020-11-30 DIAGNOSIS — G8929 Other chronic pain: Secondary | ICD-10-CM | POA: Diagnosis not present

## 2020-11-30 DIAGNOSIS — M170 Bilateral primary osteoarthritis of knee: Secondary | ICD-10-CM

## 2020-11-30 MED ORDER — BUPIVACAINE HCL 0.25 % IJ SOLN
2.0000 mL | INTRAMUSCULAR | Status: AC | PRN
  Administered 2020-11-30: 2 mL via INTRA_ARTICULAR

## 2020-11-30 MED ORDER — METHYLPREDNISOLONE ACETATE 40 MG/ML IJ SUSP
80.0000 mg | INTRAMUSCULAR | Status: AC | PRN
  Administered 2020-11-30: 80 mg via INTRA_ARTICULAR

## 2020-11-30 MED ORDER — LIDOCAINE HCL 1 % IJ SOLN
2.0000 mL | INTRAMUSCULAR | Status: AC | PRN
  Administered 2020-11-30: 2 mL

## 2020-11-30 NOTE — Progress Notes (Signed)
Office Visit Note   Patient: [name]           Date of Birth: 1945-11-08           MRN: [id number] Visit Date: 11/30/2020              Requested by: Pearson Grippe, MD [address] [place],  [place] [address] PCP: Pearson Grippe, MD   Assessment & Plan: Visit Diagnoses:  1. Chronic pain of right knee   2. Bilateral primary osteoarthritis of knee     Plan: Recurrent symptoms of osteoarthritis right knee.  Most of the pain is lateral where x-rays demonstrate some narrowing and peripheral osteophytes.  Long discussion regarding treatment options.  She prefer to have another cortisone injection.  This was performed without difficulty.  We will plan to see her back as needed  Follow-Up Instructions: Return if symptoms worsen or fail to improve.   Orders:  Orders Placed This Encounter  Procedures   XR KNEE 3 VIEW RIGHT   No orders of the defined types were placed in this encounter.     Procedures: Large Joint Inj: R knee on 11/30/2020 11:09 AM Indications: pain and diagnostic evaluation Details: 25 G 1.5 in needle, anterolateral approach  Arthrogram: No  Medications: 2 mL lidocaine 1 %; 80 mg methylPREDNISolone acetate 40 MG/ML; 2 mL bupivacaine 0.25 % Procedure, treatment alternatives, risks and benefits explained, specific risks discussed. Consent was given by the patient. Immediately prior to procedure a time out was called to verify the correct patient, procedure, equipment, support staff and site/side marked as required. Patient was prepped and draped in the usual sterile fashion.      Clinical Data: No additional findings.   Subjective: Chief Complaint  Patient presents with   Right Knee - Pain  Has been seen in the past for bilateral knee pain with evidence of osteoarthritis.  Relates that she has not had any injury or trauma but some recurrent pain predominate along the lateral aspect of the right knee.  HPI  Review of  Systems   Objective: Vital Signs: There were no vitals taken for this visit.  Physical Exam Constitutional:      Appearance: She is well-developed.  Pulmonary:     Effort: Pulmonary effort is normal.  Skin:    General: Skin is warm and dry.  Neurological:     Mental Status: She is alert and oriented to person, place, and time.  Psychiatric:        Behavior: Behavior normal.    Ortho Exam right knee was not hot red warm or swollen..  Mild discomfort along the lateral compartment.  No instability.  Full extension flexion least 105 degrees.  No popliteal pain or mass.  No calf pain.  Very minimal valgus with weightbearing  Specialty Comments:  No specialty comments available.  Imaging: XR KNEE 3 VIEW RIGHT  Result Date: 11/30/2020 Films of the right knee are obtained in 3 projections standing.  There are degenerative changes in all 3 compartments but mostly in the lateral compartment.  There is some mild narrowing and peripheral osteophyte and minimal subchondral sclerosis.  Alignment is neutral.  Mild changes at the patellofemoral and medial compartments.  No acute changes or ectopic calcification.  There is calcification within the popliteal artery    PMFS History: Patient Active Problem List   Diagnosis Date Noted   Bilateral primary osteoarthritis of knee 10/29/2019   Past Medical History:  Diagnosis Date  Arthritis    "all over" (07/25/2012)   Asthma    Environmental allergies    Fibromyalgia    GERD (gastroesophageal reflux disease)    Hyperlipidemia    Hypertension    Hypothyroidism    Peripheral vascular disease (HCC)    Post-menopausal    on HRT x 25 years    Family History  Problem Relation Age of Onset   Heart failure Mother    Hypertension Father    Hypertension Sister    Hypertension Brother    Hypertension Brother    Heart disease Brother     Past Surgical History:  Procedure Laterality Date   ABDOMINAL HYSTERECTOMY  ~ 1977   ANTERIOR CERVICAL  DECOMPRESSION/DISCECTOMY FUSION 4 LEVELS N/A 07/23/2012   Procedure: ANTERIOR CERVICAL DECOMPRESSION/DISCECTOMY FUSION 3 LEVELS;  Surgeon: Emilee Hero, MD;  Location[facility];  Service: Orthopedics;  Laterality: N/A;  Anterior cervical decompression fusion, cervical 3-4, cervical 4-5, cervical 5-6 with instrumentation and allograft.   FEMORAL ARTERY STENT Bilateral 2011   POSTERIOR CERVICAL FUSION/FORAMINOTOMY N/A 07/24/2012   Procedure: POSTERIOR CERVICAL FUSION/FORAMINOTOMY LEVEL 5;  Surgeon: Emilee Hero, MD;  Location: [facility];  Service: Orthopedics;  Laterality: N/A;  Posterior cervical decompression, cervical 6-7, C7-T1, T1-T2. Cervical fusion cervical 3-4, cervical 4-5, cervical 5-6, cervical 6-7, cervical 7-T1 with allograft, local autograft.   POSTERIOR FUSION CERVICAL SPINE  07/24/2012   C3-T2 posterior fusion with instrumentation and allograft, C6-T2 decompression)    TONSILLECTOMY  1960's?   Social History   Occupational History   Not on file  Tobacco Use   Smoking status: Former    Packs/day: 0.12    Years: 30.00    Pack years: 3.60    Types: Cigarettes    Quit date: 05/2019    Years since quitting: 1.5   Smokeless tobacco: Never   Tobacco comments:    07/25/2012 offered smoking cessation materials; pt declines  Vaping Use   Vaping Use: Never used  Substance and Sexual Activity   Alcohol use: Yes    Comment: 07/25/2012 "glass of wine maybe once a month; not often"   Drug use: No   Sexual activity: Yes     Valeria Batman, MD   Note - This record has been created using Animal nutritionist.  Chart creation errors have been sought, but may not always  have been located. Such creation errors do not reflect on  the standard of medical care.

## 2020-12-28 ENCOUNTER — Ambulatory Visit (INDEPENDENT_AMBULATORY_CARE_PROVIDER_SITE_OTHER): Payer: Medicare HMO

## 2020-12-28 ENCOUNTER — Other Ambulatory Visit: Payer: Self-pay

## 2020-12-28 ENCOUNTER — Encounter: Payer: Self-pay | Admitting: Orthopaedic Surgery

## 2020-12-28 ENCOUNTER — Ambulatory Visit (INDEPENDENT_AMBULATORY_CARE_PROVIDER_SITE_OTHER): Payer: Medicare HMO | Admitting: Orthopaedic Surgery

## 2020-12-28 VITALS — Ht 67.25 in | Wt 150.0 lb

## 2020-12-28 DIAGNOSIS — M25551 Pain in right hip: Secondary | ICD-10-CM | POA: Diagnosis not present

## 2020-12-28 DIAGNOSIS — G8929 Other chronic pain: Secondary | ICD-10-CM

## 2020-12-28 DIAGNOSIS — M545 Low back pain, unspecified: Secondary | ICD-10-CM | POA: Insufficient documentation

## 2020-12-28 DIAGNOSIS — M25552 Pain in left hip: Secondary | ICD-10-CM

## 2020-12-28 MED ORDER — HYDROCODONE-ACETAMINOPHEN 5-325 MG PO TABS
1.0000 | ORAL_TABLET | Freq: Two times a day (BID) | ORAL | 0 refills | Status: DC | PRN
Start: 2020-12-28 — End: 2021-03-22

## 2020-12-28 NOTE — Progress Notes (Signed)
Office Visit Note   Patient: [name]           Date of Birth: 24-Sep-1945           MRN: [id number] Visit Date: 12/28/2020              Requested by: Pearson Grippe, MD [address] [place],  [place] [address] PCP: Pearson Grippe, MD   Assessment & Plan: Visit Diagnoses:  1. Chronic bilateral low back pain, unspecified whether sciatica present   2. Bilateral hip pain     Plan: [name] relates onset of low back pain approximately a week ago.  The pain radiates from her back into both lower extremities and as far distally as the right ankle.  She is also had some pain along the lateral aspect of both of her hips more so on the right than the left.  X-rays demonstrate significant arthritic changes in both of her hips which would account for the pain and loss of motion by x-ray.  X-rays also demonstrate degenerative changes in the lumbar spine.  Reflexes were symmetrical and motor and sensory exam intact.  Plan course of physical therapy and hydrocodone for pain.  She will use it very sparingly.  Plan to check her back in 1 month.  Long discussion regarding what she can expect over time with her hands.  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  The present plan is to switch her to Humira  Follow-Up Instructions: No follow-ups on file.   Orders:  Orders Placed This Encounter  Procedures   XR Lumbar Spine 2-3 Views   XR Pelvis 1-2 Views   No orders of the defined types were placed in this encounter.     Procedures: No procedures performed   Clinical Data: No additional findings.   Subjective: Chief Complaint  Patient presents with   Lower Back - Pain   Right Hip - Pain  Patient presents with low back pain that radiates into both hips and down the right leg to the foot. She denies numbness and tingling. She has a history of arthritis and sees a rheumatologist. She is currently taking methotrexate and  states that she is going to be started on Humira soon.  She feels the pain is extreme after standing any length of time.  Pain started within the past week.  No history of injury or trauma.  No bowel or bladder changes.  Also experiencing some pain along the lateral aspect of both hips more on the right than the left.  No specific groin pain.   HPI  Review of Systems   Objective: Vital Signs: Ht 5' 7.25" (1.708 m)   Wt 150 lb (68 kg)   BMI 23.32 kg/m   Physical Exam Constitutional:      Appearance: She is well-developed.  Pulmonary:     Effort: Pulmonary effort is normal.  Skin:    General: Skin is warm and dry.  Neurological:     Mental Status: She is alert and oriented to person, place, and time.  Psychiatric:        Behavior: Behavior normal.    Ortho Exam awake alert and oriented x3.  Comfortable sitting in no acute distress.  There was some tenderness over the lateral aspect of the right hip compared to the left and I thought some decreased range of motion of both of her hips.  Motor and sensory exam appeared to  be intact.  There were multiple areas of tenderness along the lumbar spine.  Patient has a history of fibromyalgia.  Specialty Comments:  No specialty comments available.  Imaging: No results found.   PMFS History: Patient Active Problem List   Diagnosis Date Noted   Low back pain 12/28/2020   Bilateral primary osteoarthritis of knee 10/29/2019   Past Medical History:  Diagnosis Date   Arthritis    "all over" (07/25/2012)   Asthma    Environmental allergies    Fibromyalgia    GERD (gastroesophageal reflux disease)    Hyperlipidemia    Hypertension    Hypothyroidism    Peripheral vascular disease (HCC)    Post-menopausal    on HRT x 25 years    Family History  Problem Relation Age of Onset   Heart failure Mother    Hypertension Father    Hypertension Sister    Hypertension Brother    Hypertension Brother    Heart disease Brother     Past  Surgical History:  Procedure Laterality Date   ABDOMINAL HYSTERECTOMY  ~ 1977   ANTERIOR CERVICAL DECOMPRESSION/DISCECTOMY FUSION 4 LEVELS N/A 07/23/2012   Procedure: ANTERIOR CERVICAL DECOMPRESSION/DISCECTOMY FUSION 3 LEVELS;  Surgeon: Emilee Hero, MD;  Location: [facility];  Service: Orthopedics;  Laterality: N/A;  Anterior cervical decompression fusion, cervical 3-4, cervical 4-5, cervical 5-6 with instrumentation and allograft.   FEMORAL ARTERY STENT Bilateral 2011   POSTERIOR CERVICAL FUSION/FORAMINOTOMY N/A 07/24/2012   Procedure: POSTERIOR CERVICAL FUSION/FORAMINOTOMY LEVEL 5;  Surgeon: Emilee Hero, MD;  Location: [facility];  Service: Orthopedics;  Laterality: N/A;  Posterior cervical decompression, cervical 6-7, C7-T1, T1-T2. Cervical fusion cervical 3-4, cervical 4-5, cervical 5-6, cervical 6-7, cervical 7-T1 with allograft, local autograft.   POSTERIOR FUSION CERVICAL SPINE  07/24/2012   C3-T2 posterior fusion with instrumentation and allograft, C6-T2 decompression)    TONSILLECTOMY  1960's?   Social History   Occupational History   Not on file  Tobacco Use   Smoking status: Former    Packs/day: 0.12    Years: 30.00    Pack years: 3.60    Types: Cigarettes    Quit date: 05/2019    Years since quitting: 1.6   Smokeless tobacco: Never   Tobacco comments:    07/25/2012 offered smoking cessation materials; pt declines  Vaping Use   Vaping Use: Never used  Substance and Sexual Activity   Alcohol use: Yes    Comment: 07/25/2012 "glass of wine maybe once a month; not often"   Drug use: No   Sexual activity: Yes

## 2020-12-28 NOTE — Addendum Note (Signed)
Addended by: Rogers Seeds on: 12/28/2020 01:52 PM   Modules accepted: Orders

## 2021-01-06 ENCOUNTER — Other Ambulatory Visit: Payer: Self-pay

## 2021-01-06 ENCOUNTER — Ambulatory Visit (INDEPENDENT_AMBULATORY_CARE_PROVIDER_SITE_OTHER): Payer: Medicare HMO | Admitting: Physical Therapy

## 2021-01-06 ENCOUNTER — Encounter: Payer: Self-pay | Admitting: Physical Therapy

## 2021-01-06 DIAGNOSIS — G8929 Other chronic pain: Secondary | ICD-10-CM

## 2021-01-06 DIAGNOSIS — R262 Difficulty in walking, not elsewhere classified: Secondary | ICD-10-CM

## 2021-01-06 DIAGNOSIS — M6281 Muscle weakness (generalized): Secondary | ICD-10-CM

## 2021-01-06 DIAGNOSIS — R293 Abnormal posture: Secondary | ICD-10-CM | POA: Diagnosis not present

## 2021-01-06 DIAGNOSIS — M5441 Lumbago with sciatica, right side: Secondary | ICD-10-CM

## 2021-01-06 NOTE — Therapy (Addendum)
[facility] Physical Therapy [address] [place], [place], [address] Phone: [phone]   Fax:  [phone]  Physical Therapy Evaluation/Discharge addendum PHYSICAL THERAPY DISCHARGE SUMMARY  Visits from Start of Care: 1  Current functional level related to goals / functional outcomes: See below   Remaining deficits: See below   Education / Equipment: HEP Plan: Patient agrees to discharge.  Patient goals were not met. Patient is being discharged at her request, reason unknown but she called and left voicemail to cancel her remaining appointments.     Referring diagnosis? M54.5 Treatment diagnosis? (if different than referring diagnosis) M54.41 What was this (referring dx) caused by? _0  Surgery _1  Fall _2  Ongoing issue _3  Arthritis _4  Other: ____________  Laterality: _5  Rt _6  Lt _7  Both  Check all possible CPT codes:      _8  97110 (Therapeutic Exercise)  _9  92507 (SLP Treatment)  _10  65784 (Neuro Re-ed)   _11  69629 (Swallowing Treatment)   _12  52841 (Gait Training)   _13  32440 (Cognitive Training, 1st 15 minutes) _14  97140 (Manual Therapy)   _15  97130 (Cognitive Training, each add'l 15 minutes)  _16  97530 (Therapeutic Activities)  _17  Other, List CPT Code ____________    _18  10272 (Self Care)       _19  All codes above (97110 - 97535)  _20  97012 (Mechanical Traction)  _21  97014 (E-stim Unattended)  _22  97032 (E-stim manual)  _23  97033 (Ionto)  _24  97035 (Ultrasound)  _25  97760 (Orthotic Fit) _26  97750 (Physical Performance Training) _27  [id number] (Aquatic Therapy) _28  53664 (Contrast Bath) _29  40347 (Paraffin) _30  97597 (Wound Care 1st 20 sq cm) _31  97598 (Wound Care each add'l 20 sq cm) _32  42595 (Vasopneumatic Device) _33  63875 (Orthotic Training) _34  [phone] (Prosthetic Training)   Patient Details  Name: [name] MRN: [id number] Date of Birth: February 21, 1946 Referring Provider (PT): Garald Balding, MD   Encounter Date: 01/06/2021   PT End of  Session - 01/06/21 0948     Visit Number 1    Number of Visits 12    Date for PT Re-Evaluation 03/03/21    Authorization Type [employer]    PT Start Time 0845    PT Stop Time 0930    PT Time Calculation (min) 45 min    Activity Tolerance Patient tolerated treatment well    Behavior During Therapy [facility] for tasks assessed/performed             Past Medical History:  Diagnosis Date   Arthritis    "all over" (07/25/2012)   Asthma    Environmental allergies    Fibromyalgia    GERD (gastroesophageal reflux disease)    Hyperlipidemia    Hypertension    Hypothyroidism    Peripheral vascular disease ([place])    Post-menopausal    on HRT x 25 years    Past Surgical History:  Procedure Laterality Date   ABDOMINAL HYSTERECTOMY  ~ 1977   ANTERIOR CERVICAL DECOMPRESSION/DISCECTOMY FUSION 4 LEVELS N/A 07/23/2012   Procedure: ANTERIOR CERVICAL DECOMPRESSION/DISCECTOMY FUSION 3 LEVELS;  Surgeon: Sinclair Ship, MD;  Location: [facility];  Service: Orthopedics;  Laterality: N/A;  Anterior cervical decompression fusion, cervical 3-4, cervical 4-5, cervical 5-6 with instrumentation and allograft.   FEMORAL ARTERY STENT Bilateral 2011   POSTERIOR CERVICAL FUSION/FORAMINOTOMY N/A 07/24/2012   Procedure: POSTERIOR CERVICAL FUSION/FORAMINOTOMY LEVEL 5;  Surgeon: Sinclair Ship, MD;  Location: [facility];  Service: Orthopedics;  Laterality: N/A;  Posterior cervical decompression, cervical 6-7, C7-T1, T1-T2. Cervical fusion cervical 3-4, cervical 4-5, cervical  5-6, cervical 6-7, cervical 7-T1 with allograft, local autograft.   POSTERIOR FUSION CERVICAL SPINE  07/24/2012   C3-T2 posterior fusion with instrumentation and allograft, C6-T2 decompression)    TONSILLECTOMY  1960's?    There were no vitals filed for this visit.    Subjective Assessment - 01/06/21 0849     Subjective She reports back pain for last 2 weeks without known cause. She says the pain is severe and runs all the way down her Rt leg.  It feels like throbbing and burning but denies N/T.    Pertinent History PMH:OA,asthma,fibromyalgia,HTN, neck fusion 2014    Limitations Standing;Walking;House hold activities    Diagnostic tests lumbar XR Films are "consistent with advanced osteoarthritis.  No acute   changes "    Patient Stated Goals improve pain    Currently in Pain? Yes    Pain Score 8     Pain Location Back    Pain Orientation Right;Left    Pain Descriptors / Indicators Burning;Throbbing    Pain Type Other (Comment)   acute on chronic   Pain Radiating Towards down Rt leg    Pain Onset More than a month ago    Pain Frequency Constant    Aggravating Factors  cold, standing, walking, prolonged sitting    Pain Relieving Factors nothing    Multiple Pain Sites No                [facility] PT Assessment - 01/06/21 0001       Assessment   Medical Diagnosis Acute on chronic LBP    Referring Provider (PT) Garald Balding, MD    Onset Date/Surgical Date --   2 weeks worsening pain on chronic   Next MD Visit nothing scheduled      Precautions   Precautions None      Balance Screen   Has the patient fallen in the past 6 months No    Has the patient had a decrease in activity level because of a fear of falling?  No    Is the patient reluctant to leave their home because of a fear of falling?  No      Home Ecologist residence      Prior Function   Level of Independence Independent    Vocation Retired    Leisure wants to get back to walking      Cognition   Overall Cognitive Status Within Functional Limits for tasks assessed      Observation/Other Assessments   Focus on Therapeutic Outcomes (FOTO)  33% functional, goal 56%      Posture/Postural Control   Posture Comments scoliosis Rt concave thoracic with Rt latereral thunk shift and side bend noted      ROM / Strength   AROM / PROM / Strength AROM;Strength      AROM   AROM Assessment Site Lumbar    Lumbar Flexion 75%  painful    Lumbar Extension 50%, improved pain with repeated movment    Lumbar - Right Side Bend 25% painful    Lumbar - Left Side Bend 50% no pain    Lumbar - Right Rotation 50% no pain    Lumbar - Left Rotation 50% no pain      Strength   Strength Assessment Site Knee;Hip    Right/Left Hip Right;Left    Right Hip Flexion 3+/5    Right Hip ABduction 4/5    Left Hip Flexion 3+/5  Left Hip Extension 4/5    Right/Left Knee Right;Left    Right Knee Flexion 5/5    Right Knee Extension 4+/5    Left Knee Flexion 5/5    Left Knee Extension 5/5      Palpation   Palpation comment TTP L4-S1 and over bilat SIJ, Rt greateer trochanter      Special Tests   Other special tests +SLR on Rt, + slump test on Rt, these were negative on Lt, some pain reduction with repeated lumbar extension and Rt lateral shift correction      Transfers   Transfers Independent with all Transfers      Ambulation/Gait   Gait Comments community ambulator no AD, does have increased pain with walking currently                        Objective measurements completed on examination: See above findings.       [facility] PT Treatment/Exercise - 01/06/21 0001       Modalities   Modalities Moist Heat      Moist Heat Therapy   Number Minutes Moist Heat 10 Minutes    Moist Heat Location Lumbar Spine   with HEP creation and review                    PT Education - 01/06/21 0948     Education Details HEP,POC,exam findings    Person(s) Educated Patient    Methods Explanation;Demonstration;Verbal cues;Handout    Comprehension Verbalized understanding;Returned demonstration;Need further instruction              PT Short Term Goals - 01/06/21 0954       PT SHORT TERM GOAL #1   Title Pt will be I and compliant with HEP    Time 4    Period Weeks    Status New    Target Date 02/03/21      PT SHORT TERM GOAL #2   Title Pt will have less than 4/10 pain with standing activity  at least 10 minutes    Time 4    Period Weeks    Status New               PT Long Term Goals - 01/06/21 0955       PT LONG TERM GOAL #1   Title Pt will have less than 3/10 pain with standing activity at least 30 minutes    Time 8    Period Weeks    Status New      PT LONG TERM GOAL #2   Title Pt will report less overall radicular symptoms into her Rt leg.    Time 8    Period Weeks    Status New      PT LONG TERM GOAL #3   Title Pt will improve FOTO score to 56% functiona.    Time 8    Period Weeks    Status New      PT LONG TERM GOAL #4   Title Pt will improve Rt hip strenth to 4+ and Rt knee strength to 5/5 tested in sitting    Time 8    Period Weeks    Status New      PT LONG TERM GOAL #5   Title She will improve lumbar ROM to [facility].    Time 8    Period Weeks    Status New  Plan - 01/06/21 0949     Clinical Impression Statement Pt presents with acute on chronic low back pain with Rt leg radiculopathy. She does not descibe her leg pain as N/T but this did centralize some with lateral shift correction and repeated lumbar extension. Of note she does have scoliosis, severe OA and fibromyalgia and will need graded gentle exercise apporach. She will benefit from skilled PT address her funcitonal defiicts listed below.    Personal Factors and Comorbidities Comorbidity 3+    Comorbidities PMH:OA,asthma,fibromyalgia,HTN, neck fusion 2014    Examination-Activity Limitations Bend;Carry;Lift;Stand;Stairs;Squat;Sleep;Locomotion Level    Examination-Participation Restrictions Cleaning;Driving;Community Activity;Laundry;Meal Prep    Stability/Clinical Decision Making Evolving/Moderate complexity    Clinical Decision Making Moderate    Rehab Potential Good    PT Frequency 2x / week    PT Duration 8 weeks    PT Treatment/Interventions ADLs/Self Care Home Management;Electrical Stimulation;Cryotherapy;Moist Heat;Traction;Gait training;Therapeutic  activities;Therapeutic exercise;Neuromuscular re-education;Manual techniques;Passive range of motion;Dry needling;Taping;Joint Manipulations;Spinal Manipulations    PT Next Visit Plan review and update HEP PRN, trial of extension based program    PT Home Exercise Plan Access Code: [facility]    Consulted and Agree with Plan of Care Patient             Patient will benefit from skilled therapeutic intervention in order to improve the following deficits and impairments:  Decreased activity tolerance, Decreased mobility, Decreased range of motion, Decreased strength, Increased fascial restricitons, Increased muscle spasms, Impaired flexibility, Postural dysfunction, Improper body mechanics, Pain  Visit Diagnosis: Chronic bilateral low back pain with right-sided sciatica  Muscle weakness (generalized)  Abnormal posture  Difficulty in walking, not elsewhere classified     Problem List Patient Active Problem List   Diagnosis Date Noted   Low back pain 12/28/2020   Bilateral primary osteoarthritis of knee 10/29/2019    Debbe Odea, PT,DPT 01/06/2021, 9:59 AM  [facility] Physical Therapy [address] [place], [place], [address] Phone: [phone]   Fax:  [phone]  Name: [name] MRN: [id number] Date of Birth: 02/18/1946

## 2021-01-06 NOTE — Patient Instructions (Signed)
Access Code: [facility] URL: [email or web address] Date: 01/06/2021 Prepared by: Ivery Quale  Exercises Right Standing Lateral Shift Correction at Wall - Repetitions - 2 x daily - 6 x weekly - 1 sets - 20 reps lumbar-thoracic extension mobilization with movement using towel - 2 x daily - 6 x weekly - 1 sets - 20 reps - 5 hold Slump Stretch - 2 x daily - 6 x weekly - 1-2 sets - 10 reps - 3 hold Supine Figure 4 Piriformis Stretch - 2 x daily - 6 x weekly - 1 sets - 3 reps - 30 hold Supine Bridge - 2 x daily - 6 x weekly - 1-2 sets - 10 reps - 5 hold

## 2021-01-16 DIAGNOSIS — M5416 Radiculopathy, lumbar region: Secondary | ICD-10-CM | POA: Diagnosis not present

## 2021-01-19 DIAGNOSIS — E890 Postprocedural hypothyroidism: Secondary | ICD-10-CM | POA: Diagnosis not present

## 2021-01-19 DIAGNOSIS — R7303 Prediabetes: Secondary | ICD-10-CM | POA: Diagnosis not present

## 2021-01-20 ENCOUNTER — Encounter: Payer: Medicare HMO | Admitting: Physical Therapy

## 2021-01-20 ENCOUNTER — Other Ambulatory Visit: Payer: Self-pay | Admitting: Family Medicine

## 2021-01-20 DIAGNOSIS — M545 Low back pain, unspecified: Secondary | ICD-10-CM

## 2021-01-20 DIAGNOSIS — M5410 Radiculopathy, site unspecified: Secondary | ICD-10-CM

## 2021-01-26 ENCOUNTER — Encounter: Payer: Medicare HMO | Admitting: Physical Therapy

## 2021-01-26 DIAGNOSIS — K219 Gastro-esophageal reflux disease without esophagitis: Secondary | ICD-10-CM | POA: Diagnosis not present

## 2021-01-26 DIAGNOSIS — L4050 Arthropathic psoriasis, unspecified: Secondary | ICD-10-CM | POA: Diagnosis not present

## 2021-01-26 DIAGNOSIS — M797 Fibromyalgia: Secondary | ICD-10-CM | POA: Diagnosis not present

## 2021-01-26 DIAGNOSIS — M1990 Unspecified osteoarthritis, unspecified site: Secondary | ICD-10-CM | POA: Diagnosis not present

## 2021-01-26 DIAGNOSIS — R7303 Prediabetes: Secondary | ICD-10-CM | POA: Diagnosis not present

## 2021-01-26 DIAGNOSIS — Z79899 Other long term (current) drug therapy: Secondary | ICD-10-CM | POA: Diagnosis not present

## 2021-02-02 ENCOUNTER — Encounter: Payer: Medicare HMO | Admitting: Physical Therapy

## 2021-02-09 ENCOUNTER — Encounter: Payer: Medicare HMO | Admitting: Physical Therapy

## 2021-02-11 ENCOUNTER — Other Ambulatory Visit: Payer: Medicare HMO

## 2021-02-14 ENCOUNTER — Ambulatory Visit: Payer: PRIVATE HEALTH INSURANCE

## 2021-02-14 ENCOUNTER — Other Ambulatory Visit: Payer: Self-pay

## 2021-02-14 DIAGNOSIS — I6523 Occlusion and stenosis of bilateral carotid arteries: Secondary | ICD-10-CM | POA: Diagnosis not present

## 2021-02-16 ENCOUNTER — Other Ambulatory Visit: Payer: Self-pay

## 2021-02-16 ENCOUNTER — Ambulatory Visit
Admission: RE | Admit: 2021-02-16 | Discharge: 2021-02-16 | Disposition: A | Discharge status: Home / Self Care | Payer: Medicare HMO | Source: Ambulatory Visit | Attending: Family Medicine | Admitting: Family Medicine

## 2021-02-16 DIAGNOSIS — M545 Low back pain, unspecified: Secondary | ICD-10-CM

## 2021-02-16 DIAGNOSIS — M5410 Radiculopathy, site unspecified: Secondary | ICD-10-CM

## 2021-02-16 DIAGNOSIS — M48061 Spinal stenosis, lumbar region without neurogenic claudication: Secondary | ICD-10-CM | POA: Diagnosis not present

## 2021-02-17 DIAGNOSIS — M5416 Radiculopathy, lumbar region: Secondary | ICD-10-CM | POA: Diagnosis not present

## 2021-02-18 ENCOUNTER — Other Ambulatory Visit: Payer: Medicare HMO

## 2021-02-21 ENCOUNTER — Other Ambulatory Visit: Payer: Self-pay | Admitting: Orthopedic Surgery

## 2021-02-21 ENCOUNTER — Other Ambulatory Visit: Payer: Self-pay | Admitting: Student

## 2021-02-21 DIAGNOSIS — M25511 Pain in right shoulder: Secondary | ICD-10-CM | POA: Diagnosis not present

## 2021-02-21 DIAGNOSIS — M797 Fibromyalgia: Secondary | ICD-10-CM | POA: Diagnosis not present

## 2021-02-21 DIAGNOSIS — I6523 Occlusion and stenosis of bilateral carotid arteries: Secondary | ICD-10-CM

## 2021-02-21 DIAGNOSIS — Z79899 Other long term (current) drug therapy: Secondary | ICD-10-CM | POA: Diagnosis not present

## 2021-02-21 DIAGNOSIS — M5459 Other low back pain: Secondary | ICD-10-CM | POA: Diagnosis not present

## 2021-02-21 DIAGNOSIS — R600 Localized edema: Secondary | ICD-10-CM | POA: Diagnosis not present

## 2021-02-21 DIAGNOSIS — L4050 Arthropathic psoriasis, unspecified: Secondary | ICD-10-CM | POA: Diagnosis not present

## 2021-02-21 DIAGNOSIS — K219 Gastro-esophageal reflux disease without esophagitis: Secondary | ICD-10-CM | POA: Diagnosis not present

## 2021-02-21 DIAGNOSIS — M1990 Unspecified osteoarthritis, unspecified site: Secondary | ICD-10-CM | POA: Diagnosis not present

## 2021-02-21 NOTE — Progress Notes (Signed)
Pt called back and was transferred to [place]

## 2021-02-21 NOTE — Progress Notes (Signed)
Try calling pt but it sounds busy

## 2021-02-21 NOTE — Progress Notes (Signed)
Tired calling again and it still sounds busy

## 2021-02-27 DIAGNOSIS — E039 Hypothyroidism, unspecified: Secondary | ICD-10-CM | POA: Diagnosis not present

## 2021-02-27 DIAGNOSIS — E559 Vitamin D deficiency, unspecified: Secondary | ICD-10-CM | POA: Diagnosis not present

## 2021-02-27 DIAGNOSIS — R739 Hyperglycemia, unspecified: Secondary | ICD-10-CM | POA: Diagnosis not present

## 2021-02-27 DIAGNOSIS — I10 Essential (primary) hypertension: Secondary | ICD-10-CM | POA: Diagnosis not present

## 2021-02-27 DIAGNOSIS — F17200 Nicotine dependence, unspecified, uncomplicated: Secondary | ICD-10-CM | POA: Diagnosis not present

## 2021-02-27 DIAGNOSIS — M797 Fibromyalgia: Secondary | ICD-10-CM | POA: Diagnosis not present

## 2021-03-13 NOTE — Progress Notes (Addendum)
Surgical Instructions    Your procedure is scheduled on Monday, December 7th.  Report to [facility] Main Entrance "A" at 12:50 P.M., then check in with the Admitting office.  Call this number if you have problems the morning of surgery:  [phone]   If you have any questions prior to your surgery date call [phone]: Open Monday-Friday 8am-4pm    Remember:  Do not eat after midnight the night before your surgery  You may drink clear liquids until 12:50 the afternoon of your surgery.   Clear liquids allowed are: Water, Non-Citrus Juices (without pulp), Carbonated Beverages, Clear Tea, Black Coffee ONLY (NO MILK, CREAM OR POWDERED CREAMER of any kind), and Gatorade  Please complete your PRE-SURGERY ENSURE that was provided to you by 12:50 PM the afternoon of surgery.  Please, if able, drink it in one sitting. DO NOT SIP.     Take these medicines the morning of surgery with A SIP OF WATER  Amlodipine (Norvasc)  Fluticasone-Salmeterol (Advair) inhaler - bring with you on day of surgery  Levothyroxine (Synthroid)    If needed: Hydrocodone-Acetaminophen (Norco) Tizanidine (Zanaflex)    Follow your surgeon's instructions on when to stop Plavix.  If no instructions were given by your surgeon then you will need to call the office to get those instructions.    As of today, STOP taking any Aspirin (unless otherwise instructed by your surgeon) Aleve, Naproxen, Ibuprofen, Motrin, Advil, Goody's, BC's, all herbal medications, fish oil, and all vitamins.   DAY OF SURGERY:         Do not wear jewelry, makeup, or nail polish Do not wear lotions, powders, perfumes, or deodorant. Do not shave 48 hours prior to surgery.  Do not bring valuables to the hospital.             [facility] is not responsible for any belongings or valuables.  Do NOT Smoke (Tobacco/Vaping)  24 hours prior to your procedure  If you use a CPAP at night, you may bring your mask for your overnight stay.    Contacts, glasses, hearing aids, dentures or partials may not be worn into surgery, please bring cases for these belongings   For patients admitted to the hospital, discharge time will be determined by your treatment team.   Patients discharged the day of surgery will not be allowed to drive home, and someone needs to stay with them for 24 hours.  NO VISITORS WILL BE ALLOWED IN PRE-OP WHERE PATIENTS ARE PREPPED FOR SURGERY.  ONLY 1 SUPPORT PERSON MAY BE PRESENT IN THE WAITING ROOM WHILE YOU ARE IN SURGERY.  IF YOU ARE TO BE ADMITTED, ONCE YOU ARE IN YOUR ROOM YOU WILL BE ALLOWED TWO (2) VISITORS. 1 (ONE) VISITOR MAY STAY OVERNIGHT BUT MUST ARRIVE TO THE ROOM BY 8pm.  Minor children may have two parents present. Special consideration for safety and communication needs will be reviewed on a case by case basis.  Special instructions:    Oral Hygiene is also important to reduce your risk of infection.  Remember - BRUSH YOUR TEETH THE MORNING OF SURGERY WITH YOUR REGULAR TOOTHPASTE   [place]- Preparing For Surgery  Before surgery, you can play an important role. Because skin is not sterile, your skin needs to be as free of germs as possible. You can reduce the number of germs on your skin by washing with CHG (chlorahexidine gluconate) Soap before surgery.  CHG is an antiseptic cleaner which kills germs and bonds with the  skin to continue killing germs even after washing.     Please do not use if you have an allergy to CHG or antibacterial soaps. If your skin becomes reddened/irritated stop using the CHG.  Do not shave (including legs and underarms) for at least 48 hours prior to first CHG shower. It is OK to shave your face.  Please follow these instructions carefully.     Shower the NIGHT BEFORE SURGERY and the MORNING OF SURGERY with CHG Soap.   If you chose to wash your hair, wash your hair first as usual with your normal shampoo. After you shampoo, rinse your hair and body thoroughly to  remove the shampoo.  Then [employer] and genitals (private parts) with your normal soap and rinse thoroughly to remove soap.  After that Use CHG Soap as you would any other liquid soap. You can apply CHG directly to the skin and wash gently with a scrungie or a clean washcloth.   Apply the CHG Soap to your body ONLY FROM THE NECK DOWN.  Do not use on open wounds or open sores. Avoid contact with your eyes, ears, mouth and genitals (private parts). Wash Face and genitals (private parts)  with your normal soap.   Wash thoroughly, paying special attention to the area where your surgery will be performed.  Thoroughly rinse your body with warm water from the neck down.  DO NOT shower/wash with your normal soap after using and rinsing off the CHG Soap.  Pat yourself dry with a CLEAN TOWEL.  Wear CLEAN PAJAMAS to bed the night before surgery  Place CLEAN SHEETS on your bed the night before your surgery  DO NOT SLEEP WITH PETS.   Day of Surgery:  Take a shower with CHG soap. Wear Clean/Comfortable clothing the morning of surgery Do not apply any deodorants/lotions.   Remember to brush your teeth WITH YOUR REGULAR TOOTHPASTE.   Please read over the following fact sheets that you were given.

## 2021-03-14 ENCOUNTER — Encounter (HOSPITAL_COMMUNITY): Payer: Self-pay

## 2021-03-14 ENCOUNTER — Encounter (HOSPITAL_COMMUNITY)
Admission: RE | Admit: 2021-03-14 | Discharge: 2021-03-14 | Disposition: A | Discharge status: Home / Self Care | Payer: Medicare HMO | Source: Ambulatory Visit | Attending: Orthopedic Surgery | Admitting: Orthopedic Surgery

## 2021-03-14 ENCOUNTER — Other Ambulatory Visit: Payer: Self-pay

## 2021-03-14 VITALS — BP 160/82 | HR 87 | Temp 98.4°F | Resp 17 | Ht 67.0 in | Wt 147.2 lb

## 2021-03-14 DIAGNOSIS — M48061 Spinal stenosis, lumbar region without neurogenic claudication: Secondary | ICD-10-CM | POA: Diagnosis not present

## 2021-03-14 DIAGNOSIS — M47816 Spondylosis without myelopathy or radiculopathy, lumbar region: Secondary | ICD-10-CM | POA: Diagnosis not present

## 2021-03-14 DIAGNOSIS — K219 Gastro-esophageal reflux disease without esophagitis: Secondary | ICD-10-CM | POA: Insufficient documentation

## 2021-03-14 DIAGNOSIS — E039 Hypothyroidism, unspecified: Secondary | ICD-10-CM | POA: Insufficient documentation

## 2021-03-14 DIAGNOSIS — I739 Peripheral vascular disease, unspecified: Secondary | ICD-10-CM | POA: Insufficient documentation

## 2021-03-14 DIAGNOSIS — I119 Hypertensive heart disease without heart failure: Secondary | ICD-10-CM | POA: Insufficient documentation

## 2021-03-14 DIAGNOSIS — Z01812 Encounter for preprocedural laboratory examination: Secondary | ICD-10-CM | POA: Insufficient documentation

## 2021-03-14 DIAGNOSIS — F17210 Nicotine dependence, cigarettes, uncomplicated: Secondary | ICD-10-CM | POA: Diagnosis not present

## 2021-03-14 DIAGNOSIS — M797 Fibromyalgia: Secondary | ICD-10-CM | POA: Insufficient documentation

## 2021-03-14 DIAGNOSIS — J45909 Unspecified asthma, uncomplicated: Secondary | ICD-10-CM | POA: Diagnosis not present

## 2021-03-14 DIAGNOSIS — E785 Hyperlipidemia, unspecified: Secondary | ICD-10-CM | POA: Diagnosis not present

## 2021-03-14 DIAGNOSIS — Z01818 Encounter for other preprocedural examination: Secondary | ICD-10-CM

## 2021-03-14 HISTORY — DX: Disorder of arteries and arterioles, unspecified: I77.9

## 2021-03-14 LAB — TYPE AND SCREEN
ABO/RH(D): O POS
Antibody Screen: NEGATIVE

## 2021-03-14 LAB — COMPREHENSIVE METABOLIC PANEL
ALT: 11 U/L (ref 0–44)
AST: 17 U/L (ref 15–41)
Albumin: 4.1 g/dL (ref 3.5–5.0)
Alkaline Phosphatase: 49 U/L (ref 38–126)
Anion gap: 6 (ref 5–15)
BUN: 9 mg/dL (ref 8–23)
CO2: 30 mmol/L (ref 22–32)
Calcium: 9.9 mg/dL (ref 8.9–10.3)
Chloride: 101 mmol/L (ref 98–111)
Creatinine, Ser: 0.61 mg/dL (ref 0.44–1.00)
GFR, Estimated: >60 mL/min (ref >60)
Glucose, Bld: 102 mg/dL — ABNORMAL HIGH (ref 70–99)
Potassium: 3.1 mmol/L — ABNORMAL LOW (ref 3.5–5.1)
Sodium: 137 mmol/L (ref 135–145)
Total Bilirubin: 0.9 mg/dL (ref 0.3–1.2)
Total Protein: 7.3 g/dL (ref 6.5–8.1)

## 2021-03-14 LAB — URINALYSIS, ROUTINE W REFLEX MICROSCOPIC
Bilirubin Urine: NEGATIVE
Glucose, UA: NEGATIVE mg/dL
Hgb urine dipstick: NEGATIVE
Ketones, ur: NEGATIVE mg/dL
Leukocytes,Ua: NEGATIVE
Nitrite: NEGATIVE
Protein, ur: NEGATIVE mg/dL
Specific Gravity, Urine: 1.006 (ref 1.005–1.030)
pH: 7 (ref 5.0–8.0)

## 2021-03-14 LAB — CBC WITH DIFFERENTIAL/PLATELET
Abs Immature Granulocytes: 0.05 10*3/uL (ref 0.00–0.07)
Basophils Absolute: 0 10*3/uL (ref 0.0–0.1)
Basophils Relative: 1 %
Eosinophils Absolute: 0.2 10*3/uL (ref 0.0–0.5)
Eosinophils Relative: 3 %
HCT: 41.1 % (ref 36.0–46.0)
Hemoglobin: 13.9 g/dL (ref 12.0–15.0)
Immature Granulocytes: 1 %
Lymphocytes Relative: 38 %
Lymphs Abs: 2 10*3/uL (ref 0.7–4.0)
MCH: 31.4 pg (ref 26.0–34.0)
MCHC: 33.8 g/dL (ref 30.0–36.0)
MCV: 92.8 fL (ref 80.0–100.0)
Monocytes Absolute: 0.5 10*3/uL (ref 0.1–1.0)
Monocytes Relative: 9 %
Neutro Abs: 2.6 10*3/uL (ref 1.7–7.7)
Neutrophils Relative %: 48 %
Platelets: 259 10*3/uL (ref 150–400)
RBC: 4.43 MIL/uL (ref 3.87–5.11)
RDW: 12.8 % (ref 11.5–15.5)
WBC: 5.3 10*3/uL (ref 4.0–10.5)
nRBC: 0 % (ref 0.0–0.2)

## 2021-03-14 LAB — PROTIME-INR
INR: 0.9 (ref 0.8–1.2)
Prothrombin Time: 12.6 seconds (ref 11.4–15.2)

## 2021-03-14 LAB — SURGICAL PCR SCREEN
MRSA, PCR: NEGATIVE
Staphylococcus aureus: NEGATIVE

## 2021-03-14 LAB — APTT: aPTT: 29 seconds (ref 24–36)

## 2021-03-14 NOTE — Pre-Procedure Instructions (Addendum)
Surgical Instructions    Your procedure is scheduled on Wednesday, December 7th.  Report to [facility] Main Entrance "A" at 12:45 P.M., then check in with the Admitting office.  Call this number if you have problems the morning of surgery:  [phone]   If you have any questions prior to your surgery date call [phone]: Open Monday-Friday 8am-4pm    Remember:  Do not eat after midnight the night before your surgery  You may drink clear liquids until 12:45 p.m. the afternoon of your surgery.   Clear liquids allowed are: Water, Non-Citrus Juices (without pulp), Carbonated Beverages, Clear Tea, Black Coffee Only, and Gatorade.   Enhanced Recovery after Surgery for Orthopedics Enhanced Recovery after Surgery is a protocol used to improve the stress on your body and your recovery after surgery.  Patient Instructions  The day of surgery (if you do NOT have diabetes):  Drink ONE (1) Pre-Surgery Clear Ensure by 12:45 p.m. the afternoon of surgery   This drink was given to you during your hospital  pre-op appointment visit. Nothing else to drink after completing the  Pre-Surgery Clear Ensure.         If you have questions, please contact your surgeon's office.     Take these medicines the morning of surgery with A SIP OF WATER  amLODipine (NORVASC)  levothyroxine (SYNTHROID)  pantoprazole (PROTONIX)  As needed: Fluticasone-Salmeterol (ADVAIR)-please bring inhaler with you to the hospital. pregabalin (LYRICA)  traMADol (ULTRAM)   Follow your surgeon's instructions on when to stop clopidogrel (PLAVIX).  If no instructions were given by your surgeon then you will need to call the office to get those instructions.    As of today, STOP taking any Aspirin (unless otherwise instructed by your surgeon) Aleve, Naproxen, Ibuprofen, Motrin, Advil, Goody's, BC's, all herbal medications, fish oil, and all vitamins.                     Do NOT Smoke (Tobacco/Vaping) or drink Alcohol 24  hours prior to your procedure.  If you use a CPAP at night, you may bring all equipment for your overnight stay.   Contacts, glasses, piercing's, hearing aid's, dentures or partials may not be worn into surgery, please bring cases for these belongings.    For patients admitted to the hospital, discharge time will be determined by your treatment team.   Patients discharged the day of surgery will not be allowed to drive home, and someone needs to stay with them for 24 hours.  NO VISITORS WILL BE ALLOWED IN PRE-OP WHERE PATIENTS GET READY FOR SURGERY.  ONLY 1 SUPPORT PERSON MAY BE PRESENT IN THE WAITING ROOM WHILE YOU ARE IN SURGERY.  IF YOU ARE TO BE ADMITTED, ONCE YOU ARE IN YOUR ROOM YOU WILL BE ALLOWED TWO (2) VISITORS.  Minor children may have two parents present. Special consideration for safety and communication needs will be reviewed on a case by case basis.   Special instructions:   [place]- Preparing For Surgery  Before surgery, you can play an important role. Because skin is not sterile, your skin needs to be as free of germs as possible. You can reduce the number of germs on your skin by washing with CHG (chlorahexidine gluconate) Soap before surgery.  CHG is an antiseptic cleaner which kills germs and bonds with the skin to continue killing germs even after washing.    Oral Hygiene is also important to reduce your risk of infection.  Remember -  BRUSH YOUR TEETH THE MORNING OF SURGERY WITH YOUR REGULAR TOOTHPASTE  Please do not use if you have an allergy to CHG or antibacterial soaps. If your skin becomes reddened/irritated stop using the CHG.  Do not shave (including legs and underarms) for at least 48 hours prior to first CHG shower. It is OK to shave your face.  Please follow these instructions carefully.   Shower the NIGHT BEFORE SURGERY and the MORNING OF SURGERY  If you chose to wash your hair, wash your hair first as usual with your normal shampoo.  After you  shampoo, rinse your hair and body thoroughly to remove the shampoo.  Use CHG Soap as you would any other liquid soap. You can apply CHG directly to the skin and wash gently with a scrungie or a clean washcloth.   Apply the CHG Soap to your body ONLY FROM THE NECK DOWN.  Do not use on open wounds or open sores. Avoid contact with your eyes, ears, mouth and genitals (private parts). Wash Face and genitals (private parts)  with your normal soap.   Wash thoroughly, paying special attention to the area where your surgery will be performed.  Thoroughly rinse your body with warm water from the neck down.  DO NOT shower/wash with your normal soap after using and rinsing off the CHG Soap.  Pat yourself dry with a CLEAN TOWEL.  Wear CLEAN PAJAMAS to bed the night before surgery  Place CLEAN SHEETS on your bed the night before your surgery  DO NOT SLEEP WITH PETS.   Day of Surgery: Shower with CHG soap. Do not wear jewelry, make up, nail polish, gel polish, artificial nails, or any other type of covering on natural nails including finger and toenails. If patients have artificial nails, gel coating, etc. that need to be removed by a nail salon please have this removed prior to surgery. Surgery may need to be canceled/delayed if the surgeon/ anesthesia feels like the patient is unable to be adequately monitored. Do not wear lotions, powders, perfumes, or deodorant. Do not shave 48 hours prior to surgery.   Do not bring valuables to the hospital. [facility] is not responsible for any belongings or valuables. Wear Clean/Comfortable clothing the morning of surgery Remember to brush your teeth WITH YOUR REGULAR TOOTHPASTE.   Please read over the following fact sheets that you were given.   3 days prior to your procedure or After your COVID test   You are not required to quarantine however you are required to wear a well-fitting mask when you are out and around people not in your household. If  your mask becomes wet or soiled, replace with a new one.   Wash your hands often with soap and water for 20 seconds or clean your hands with an alcohol-based hand sanitizer that contains at least 60% alcohol.   Do not share personal items.   Notify your provider:  o if you are in close contact with someone who has COVID  o or if you develop a fever of 100.4 or greater, sneezing, cough, sore throat, shortness of breath or body aches.

## 2021-03-14 NOTE — Progress Notes (Signed)
PCP - Irena Reichmann, DO Cardiologist - Dr. Jacinto Halim (been a while since last visit)  PPM/ICD - n/a  Chest x-ray - n/a EKG - 06/21/20 Stress Test - 2010 ECHO - denies Cardiac Cath - denies  Sleep Study - denies CPAP - denies  Blood Thinner Instructions:Plavix, pt will call office for instructions.  Aspirin Instructions:n/a  ERAS Protcol -Clear liquids until 1245 DOS PRE-SURGERY Ensure or G2- (1) Ensure provided.   COVID TEST- Scheduled for 03/20/21 in PAT.  Anesthesia review: Yes  Patient denies shortness of breath, fever, cough and chest pain at PAT appointment   All instructions explained to the patient, with a verbal understanding of the material. Patient agrees to go over the instructions while at home for a better understanding. Patient also instructed to self quarantine after being tested for COVID-19. The opportunity to ask questions was provided.

## 2021-03-15 ENCOUNTER — Encounter (HOSPITAL_COMMUNITY): Payer: Self-pay

## 2021-03-15 ENCOUNTER — Telehealth: Payer: Self-pay

## 2021-03-15 NOTE — Telephone Encounter (Signed)
As discussed she should follow surgery office instructions

## 2021-03-15 NOTE — Progress Notes (Addendum)
Anesthesia Chart Review:  Case: [id number] Date/Time: 03/22/21 1534   Procedure: LUMBAR 4 - LUMBAR 5 DECOMPRESSION   Anesthesia type: General   Pre-op diagnosis: SPINAL STENOSIS, DISC HERNIATION   [facility] RO[facility] / [facility]   Surgeons: Estill Bamberg, MD       DISCUSSION: Patient is scheduled for the above procedure. She will be a 75 year old female by her surgery date.   History includes smoking, HTN, HLD, GERD, asthma, hypothyroidism, fibromyalgia, PAD (s/p right SFA stent 05/29/10; left SFA atherectomy, balloon angioplasty 06/20/10), arthritis (osteoarthritis, psoriatic arthritis), carotid artery stenosis, spinal surgery (C3-6 ACDF 07/23/12; C3-T2 posterior fusion 07/24/12).  Last cardiology follow-up with Elvin So, PA-C on 06/20/20 for follow-up PAD and carotid artery disease.  She noted patient still smoking about 2 cigarettes/day, but was walking 3 to 5 days/week for approximately 1 hour without issue. Patient on Plavix over ASA due to patient preference. One year office visit planned with carotid US in six months. 02/14/21 carotid US showed some progression of right carotid disease (suggestive > 70%), so six month Duplex US or alternate imaging planned (CTA neck scheduled for 04/20/21).   Lupita Leash at Dr. Marshell Levan office is reaching out to Dr. Verl Dicker office regarding perioperative Plavix instructions and request for surgical clearance. Chart will be left for follow-up.  Preoperative COVID-19 testing scheduled for 03/20/2021.   ADDENDUM 03/20/21 12:43 PM:  Last week I had previously communicated with Elvin So, PA-C about surgery plans. Cardiology clearance now received from [facility]. She was classified as "Low risk" with permission to hold Plavix for 7 days prior to surgery and resume 1 day post procedure if safe per surgeon.    VS: BP (!) 160/82 Comment: manual reading  Pulse 87   Temp 36.9 C   Resp 17   Ht 5\' 7"  (1.702 m)   Wt 66.8 kg   SpO2 100%   BMI 23.05  kg/m    PROVIDERS: , DO is PCP [facility]) [facility], MD is cardiologist  Yates Decamp, MD is rheumatologist   LABS: Labs reviewed: Acceptable for surgery.  A1c 5.7% 01/19/21 [facility]) (all labs ordered are listed, but only abnormal results are displayed)  Labs Reviewed  COMPREHENSIVE METABOLIC PANEL - Abnormal; Notable for the following components:      Result Value   Potassium 3.1 (*)    Glucose, Bld 102 (*)    All other components within normal limits  URINALYSIS, ROUTINE W REFLEX MICROSCOPIC - Abnormal; Notable for the following components:   Color, Urine STRAW (*)    All other components within normal limits  SURGICAL PCR SCREEN  CBC WITH DIFFERENTIAL/PLATELET  PROTIME-INR  APTT  TYPE AND SCREEN    IMAGES: MRI L-spine 02/16/21: IMPRESSION: 1. Lumbar spine spondylosis as described above. [See full report, Result Review tab] 2. No acute osseous injury of the lumbar spine.    EKG: EKG 06/20/2020: Sinus arrhythmia at a rate of 82 bpm.  Left atrial enlargement normal axis.  Poor R wave progression, cannot exclude anteroseptal infarct old.  Low voltage complexes, consider pulmonary disease pattern.   CV: Carotid artery duplex 02/14/2021:  RIGHT: PSV (cm/s)  EDV (cm/s)  Stenosis ECA    83   13   <50% ICA-dis   161   37   >=70% ICA-mid   74   10   50-69% ICA-prox   119   17   >=70% Bifurcation   311   82   >50% CCA-dis  20   6 CCA-mid   26   6 CCA-prox   42   10 PSV ICA/CCA ratio: 5.95  LEFT: PSV (cm/s)  EDV (cm/s)  Stenosis ECA    87   20 ICA-dis   92   33   16-49% ICA-mid   168   47   50-69% ICA-prox   164   47   50-69% Bifurcation   143   44   <50% CCA-dis   80   26 CCA-mid   79   26 CCA-prox   61   27 PSV ICA/CCA ratio: 2.11  Conclusions:  - Duplex suggests stenosis in the right internal carotid artery (>=70%). [Proximal ICA peak velocity 119/17, previously 125/25 and Bifurcation peak velocity 311/82,  previously 70/16 on 06/28/2020.]. The right PSV internal/common carotid artery ratio of  5.95 is consistent with a stenosis of >70%. Duplex suggests stenosis in the right external carotid artery (<50%).  - Duplex suggests stenosis in the left internal carotid artery (50-69%). [Proximal ICA peak velocity 164/47, previously 50-69% with proximal ICA peak velocity 197/43 on 06/28/2020.] Antegrade right vertebral artery flow. Antegrade left vertebral artery flow. - Compared to the study done on 06/28/2020, significant change in the left ICA stenosis.  The right ICA velocity range was in 50 to 69%, upper end of spectrum with ICA/CCA ratio of 3.9.  In the present study, the ratio is 5.95, no significant change in velocity in the ICA however there appears to be progression of presently in the common carotid artery bulb with peak velocity of 311/82 cm/s.  There repeat study in 6 months or alternative imaging study. [CTA neck is scheduled for 04/20/2021]   ABI 05/11/2019:  This exam reveals mildly decreased perfusion of the right and left lower extremity, noted at the post tibial artery level (ABI 0.81).  Multiphasic waveform at the level of the ankles.  No significant change in ABI from 01/26/2013.   Echo 08/05/2008 (former [facility], scanned under Media tab, Correspondence Enc 07/17/2012): Summary: The left ventricular cavity is small. There is mild concentric LVH. The LV is hyperdynamic LV. The transmitral spectral Doppler flow pattern is suggestive of impaired LV relaxation. There is trace mitral regurgitation.  There is trace tricuspid regurgitation.  Right ventricular systolic pressure is normal.   The aortic valve is trileaflet.  Aortic valve appears to be mildly sclerotic but opens well.   There is aortic root sclerosis/calcification.    Nuclear stress test 08/05/2008 (former [facility], scanned under Media tab, Correspondence Enc 07/17/2012): Impression: Normal myocardial perfusion study.  No significant ischemia.   Post-stress EF 83%.  No significant wall motion abnormalities.  Low risk scan.   Past Medical History:  Diagnosis Date   Arthritis    "all over" (07/25/2012)   Asthma    Carotid artery disease (HCC)    Environmental allergies    Fibromyalgia    GERD (gastroesophageal reflux disease)    Hyperlipidemia    Hypertension    Hypothyroidism    Peripheral vascular disease (HCC)    Post-menopausal    on HRT x 25 years    Past Surgical History:  Procedure Laterality Date   ABDOMINAL HYSTERECTOMY  ~ 1977   ANTERIOR CERVICAL DECOMPRESSION/DISCECTOMY FUSION 4 LEVELS N/A 07/23/2012   Procedure: ANTERIOR CERVICAL DECOMPRESSION/DISCECTOMY FUSION 3 LEVELS;  Surgeon: Emilee Hero, MD;  Location: [facility] OR;  Service: Orthopedics;  Laterality: N/A;  Anterior cervical decompression fusion, cervical 3-4, cervical 4-5, cervical 5-6 with instrumentation and allograft.  FEMORAL ARTERY STENT Bilateral 2011   POSTERIOR CERVICAL FUSION/FORAMINOTOMY N/A 07/24/2012   Procedure: POSTERIOR CERVICAL FUSION/FORAMINOTOMY LEVEL 5;  Surgeon: Emilee Hero, MD;  Location: [facility] OR;  Service: Orthopedics;  Laterality: N/A;  Posterior cervical decompression, cervical 6-7, C7-T1, T1-T2. Cervical fusion cervical 3-4, cervical 4-5, cervical 5-6, cervical 6-7, cervical 7-T1 with allograft, local autograft.   POSTERIOR FUSION CERVICAL SPINE  07/24/2012   C3-T2 posterior fusion with instrumentation and allograft, C6-T2 decompression)    TONSILLECTOMY  1960's?    MEDICATIONS:  amitriptyline (ELAVIL) 150 MG tablet   amLODipine (NORVASC) 10 MG tablet   clopidogrel (PLAVIX) 75 MG tablet   estrogens, conjugated, (PREMARIN) 0.625 MG tablet   Evolocumab (REPATHA SURECLICK) 140 MG/ML SOAJ   Fluticasone-Salmeterol (ADVAIR) 100-50 MCG/DOSE AEPB   HYDROcodone-acetaminophen (NORCO/VICODIN) 5-325 MG tablet   levothyroxine (SYNTHROID) 100 MCG tablet   losartan (COZAAR) 50 MG tablet   methotrexate (RHEUMATREX) 2.5 MG tablet    Multiple Vitamin (MULTIVITAMIN WITH MINERALS) TABS tablet   pantoprazole (PROTONIX) 40 MG tablet   pregabalin (LYRICA) 50 MG capsule   traMADol (ULTRAM) 50 MG tablet   No current facility-administered medications for this encounter.    Shonna Chock, PA-C Surgical Short Stay/Anesthesiology [facility] Phone [phone] [facility] Phone [phone] 03/15/2021 5:22 PM

## 2021-03-15 NOTE — Telephone Encounter (Signed)
Pt was informed about the message above.

## 2021-03-17 ENCOUNTER — Other Ambulatory Visit (HOSPITAL_COMMUNITY): Payer: Medicare HMO

## 2021-03-20 ENCOUNTER — Other Ambulatory Visit (HOSPITAL_COMMUNITY)
Admission: RE | Admit: 2021-03-20 | Discharge: 2021-03-20 | Disposition: A | Discharge status: Home / Self Care | Payer: Medicare HMO | Source: Ambulatory Visit | Attending: Orthopedic Surgery | Admitting: Orthopedic Surgery

## 2021-03-20 DIAGNOSIS — Z20822 Contact with and (suspected) exposure to covid-19: Secondary | ICD-10-CM | POA: Insufficient documentation

## 2021-03-20 DIAGNOSIS — Z01812 Encounter for preprocedural laboratory examination: Secondary | ICD-10-CM | POA: Insufficient documentation

## 2021-03-20 DIAGNOSIS — M5416 Radiculopathy, lumbar region: Secondary | ICD-10-CM | POA: Diagnosis not present

## 2021-03-20 DIAGNOSIS — Z01818 Encounter for other preprocedural examination: Secondary | ICD-10-CM

## 2021-03-20 LAB — SARS CORONAVIRUS 2 (TAT 6-24 HRS): SARS Coronavirus 2: NEGATIVE

## 2021-03-20 NOTE — Anesthesia Preprocedure Evaluation (Addendum)
Anesthesia Evaluation  Patient identified by MRN, date of birth, ID band Patient awake    Reviewed: NPO status , Patient's Chart, lab work & pertinent test results  Airway Mallampati: II  TM Distance: >3 FB     Dental   Pulmonary asthma , Current Smoker and Patient abstained from smoking.,    breath sounds clear to auscultation       Cardiovascular hypertension, + Peripheral Vascular Disease   Rhythm:Regular Rate:Normal     Neuro/Psych  Neuromuscular disease    GI/Hepatic GERD  ,  Endo/Other  Hypothyroidism   Renal/GU negative Renal ROS     Musculoskeletal  (+) Arthritis , Fibromyalgia -  Abdominal   Peds  Hematology   Anesthesia Other Findings   Reproductive/Obstetrics                            Anesthesia Physical Anesthesia Plan  ASA: 3  Anesthesia Plan: General   Post-op Pain Management:    Induction: Intravenous  PONV Risk Score and Plan: 2 and Ondansetron and Dexamethasone  Airway Management Planned: Oral ETT  Additional Equipment:   Intra-op Plan:   Post-operative Plan: Extubation in OR  Informed Consent: I have reviewed the patients History and Physical, chart, labs and discussed the procedure including the risks, benefits and alternatives for the proposed anesthesia with the patient or authorized representative who has indicated his/her understanding and acceptance.     Dental advisory given  Plan Discussed with: Anesthesiologist and CRNA  Anesthesia Plan Comments: (PAT note written by Shonna Chock, PA-C. )       Anesthesia Quick Evaluation

## 2021-03-22 ENCOUNTER — Encounter (HOSPITAL_COMMUNITY): Payer: Self-pay | Admitting: Orthopedic Surgery

## 2021-03-22 ENCOUNTER — Other Ambulatory Visit: Payer: Self-pay

## 2021-03-22 ENCOUNTER — Ambulatory Visit (HOSPITAL_COMMUNITY): Payer: Medicare HMO | Admitting: Vascular Surgery

## 2021-03-22 ENCOUNTER — Encounter (HOSPITAL_COMMUNITY)
Admission: RE | Disposition: A | Discharge status: Home / Self Care | Payer: Self-pay | Source: Home / Self Care | Attending: Orthopedic Surgery

## 2021-03-22 ENCOUNTER — Ambulatory Visit (HOSPITAL_COMMUNITY)
Admission: RE | Admit: 2021-03-22 | Discharge: 2021-03-22 | Disposition: A | Discharge status: Home / Self Care | Payer: Medicare HMO | Attending: Orthopedic Surgery | Admitting: Orthopedic Surgery

## 2021-03-22 ENCOUNTER — Ambulatory Visit (HOSPITAL_COMMUNITY): Payer: Medicare HMO

## 2021-03-22 ENCOUNTER — Ambulatory Visit (HOSPITAL_COMMUNITY): Payer: Medicare HMO | Admitting: Certified Registered"

## 2021-03-22 DIAGNOSIS — M5116 Intervertebral disc disorders with radiculopathy, lumbar region: Secondary | ICD-10-CM | POA: Insufficient documentation

## 2021-03-22 DIAGNOSIS — I10 Essential (primary) hypertension: Secondary | ICD-10-CM | POA: Diagnosis not present

## 2021-03-22 DIAGNOSIS — M48061 Spinal stenosis, lumbar region without neurogenic claudication: Secondary | ICD-10-CM | POA: Insufficient documentation

## 2021-03-22 DIAGNOSIS — E785 Hyperlipidemia, unspecified: Secondary | ICD-10-CM | POA: Diagnosis not present

## 2021-03-22 DIAGNOSIS — F17210 Nicotine dependence, cigarettes, uncomplicated: Secondary | ICD-10-CM | POA: Insufficient documentation

## 2021-03-22 DIAGNOSIS — Z419 Encounter for procedure for purposes other than remedying health state, unspecified: Secondary | ICD-10-CM

## 2021-03-22 DIAGNOSIS — M2578 Osteophyte, vertebrae: Secondary | ICD-10-CM | POA: Diagnosis not present

## 2021-03-22 HISTORY — PX: LUMBAR LAMINECTOMY/DECOMPRESSION MICRODISCECTOMY: SHX5026

## 2021-03-22 SURGERY — LUMBAR LAMINECTOMY/DECOMPRESSION MICRODISCECTOMY
Anesthesia: General | Site: Spine Lumbar

## 2021-03-22 MED ORDER — THROMBIN (RECOMBINANT) 20000 UNITS EX SOLR
CUTANEOUS | Status: AC
  Filled 2021-03-22: qty 20000

## 2021-03-22 MED ORDER — METHYLPREDNISOLONE ACETATE 40 MG/ML IJ SUSP
INTRAMUSCULAR | Status: AC
  Filled 2021-03-22: qty 1

## 2021-03-22 MED ORDER — HYDROCODONE-ACETAMINOPHEN 5-325 MG PO TABS: 1.0000 | ORAL_TABLET | Freq: Four times a day (QID) | ORAL | 0 refills | Status: DC | PRN

## 2021-03-22 MED ORDER — BUPIVACAINE LIPOSOME 1.3 % IJ SUSP
INTRAMUSCULAR | Status: DC | PRN
  Administered 2021-03-22: 20 mL

## 2021-03-22 MED ORDER — LIDOCAINE 2% (20 MG/ML) 5 ML SYRINGE
INTRAMUSCULAR | Status: DC | PRN
  Administered 2021-03-22: 100 mg via INTRAVENOUS

## 2021-03-22 MED ORDER — SUGAMMADEX SODIUM 200 MG/2ML IV SOLN
INTRAVENOUS | Status: DC | PRN
  Administered 2021-03-22: 150 mg via INTRAVENOUS

## 2021-03-22 MED ORDER — ROCURONIUM BROMIDE 10 MG/ML (PF) SYRINGE
PREFILLED_SYRINGE | INTRAVENOUS | Status: AC
  Filled 2021-03-22: qty 10

## 2021-03-22 MED ORDER — DEXAMETHASONE SODIUM PHOSPHATE 10 MG/ML IJ SOLN
INTRAMUSCULAR | Status: AC
  Filled 2021-03-22: qty 1

## 2021-03-22 MED ORDER — BUPIVACAINE-EPINEPHRINE 0.25% -1:200000 IJ SOLN
INTRAMUSCULAR | Status: DC | PRN
  Administered 2021-03-22: 7 mL
  Administered 2021-03-22: 20 mL

## 2021-03-22 MED ORDER — ROCURONIUM BROMIDE 10 MG/ML (PF) SYRINGE
PREFILLED_SYRINGE | INTRAVENOUS | Status: DC | PRN
  Administered 2021-03-22: 60 mg via INTRAVENOUS
  Administered 2021-03-22: 20 mg via INTRAVENOUS

## 2021-03-22 MED ORDER — FENTANYL CITRATE (PF) 250 MCG/5ML IJ SOLN
INTRAMUSCULAR | Status: AC
  Filled 2021-03-22: qty 5

## 2021-03-22 MED ORDER — DEXAMETHASONE SODIUM PHOSPHATE 10 MG/ML IJ SOLN
INTRAMUSCULAR | Status: DC | PRN
  Administered 2021-03-22: 5 mg via INTRAVENOUS

## 2021-03-22 MED ORDER — EPHEDRINE SULFATE-NACL 50-0.9 MG/10ML-% IV SOSY
PREFILLED_SYRINGE | INTRAVENOUS | Status: DC | PRN
  Administered 2021-03-22: 10 mg via INTRAVENOUS
  Administered 2021-03-22: 5 mg via INTRAVENOUS
  Administered 2021-03-22: 10 mg via INTRAVENOUS

## 2021-03-22 MED ORDER — ONDANSETRON HCL 4 MG/2ML IJ SOLN
INTRAMUSCULAR | Status: AC
  Filled 2021-03-22: qty 2

## 2021-03-22 MED ORDER — FENTANYL CITRATE (PF) 100 MCG/2ML IJ SOLN
INTRAMUSCULAR | Status: AC
  Filled 2021-03-22: qty 2

## 2021-03-22 MED ORDER — PHENYLEPHRINE 40 MCG/ML (10ML) SYRINGE FOR IV PUSH (FOR BLOOD PRESSURE SUPPORT)
PREFILLED_SYRINGE | INTRAVENOUS | Status: AC
  Filled 2021-03-22: qty 10

## 2021-03-22 MED ORDER — THROMBIN 20000 UNITS EX SOLR
CUTANEOUS | Status: DC | PRN
  Administered 2021-03-22: 20000 mL via TOPICAL

## 2021-03-22 MED ORDER — OXYCODONE HCL 5 MG PO TABS
5.0000 mg | ORAL_TABLET | Freq: Once | ORAL | Status: AC
  Administered 2021-03-22: 5 mg via ORAL

## 2021-03-22 MED ORDER — CEFAZOLIN SODIUM-DEXTROSE 2-4 GM/100ML-% IV SOLN
INTRAVENOUS | Status: AC
  Filled 2021-03-22: qty 100

## 2021-03-22 MED ORDER — METHYLENE BLUE 0.5 % INJ SOLN
INTRAVENOUS | Status: AC
  Filled 2021-03-22: qty 10

## 2021-03-22 MED ORDER — CHLORHEXIDINE GLUCONATE 0.12 % MT SOLN: 15.0000 mL | Freq: Once | OROMUCOSAL | Status: AC

## 2021-03-22 MED ORDER — ONDANSETRON HCL 4 MG/2ML IJ SOLN
INTRAMUSCULAR | Status: DC | PRN
  Administered 2021-03-22: 4 mg via INTRAVENOUS

## 2021-03-22 MED ORDER — FENTANYL CITRATE (PF) 100 MCG/2ML IJ SOLN
INTRAMUSCULAR | Status: DC | PRN
  Administered 2021-03-22 (×2): 50 ug via INTRAVENOUS

## 2021-03-22 MED ORDER — CEFAZOLIN SODIUM-DEXTROSE 2-4 GM/100ML-% IV SOLN
2.0000 g | INTRAVENOUS | Status: AC
  Administered 2021-03-22: 2 g via INTRAVENOUS

## 2021-03-22 MED ORDER — METHYLPREDNISOLONE ACETATE 40 MG/ML IJ SUSP
INTRAMUSCULAR | Status: DC | PRN
  Administered 2021-03-22: 40 mg

## 2021-03-22 MED ORDER — LACTATED RINGERS IV SOLN: INTRAVENOUS | Status: DC

## 2021-03-22 MED ORDER — 0.9 % SODIUM CHLORIDE (POUR BTL) OPTIME
TOPICAL | Status: DC | PRN
  Administered 2021-03-22: 1000 mL

## 2021-03-22 MED ORDER — ORAL CARE MOUTH RINSE: 15.0000 mL | Freq: Once | OROMUCOSAL | Status: AC

## 2021-03-22 MED ORDER — LIDOCAINE 2% (20 MG/ML) 5 ML SYRINGE
INTRAMUSCULAR | Status: AC
  Filled 2021-03-22: qty 5

## 2021-03-22 MED ORDER — METHOCARBAMOL 500 MG PO TABS
ORAL_TABLET | ORAL | Status: AC
  Filled 2021-03-22: qty 1

## 2021-03-22 MED ORDER — CHLORHEXIDINE GLUCONATE 0.12 % MT SOLN
OROMUCOSAL | Status: AC
  Administered 2021-03-22: 15 mL via OROMUCOSAL
  Filled 2021-03-22: qty 15

## 2021-03-22 MED ORDER — POVIDONE-IODINE 7.5 % EX SOLN
Freq: Once | CUTANEOUS | Status: AC
  Filled 2021-03-22: qty 118

## 2021-03-22 MED ORDER — PROPOFOL 10 MG/ML IV BOLUS
INTRAVENOUS | Status: AC
  Filled 2021-03-22: qty 20

## 2021-03-22 MED ORDER — BUPIVACAINE-EPINEPHRINE (PF) 0.25% -1:200000 IJ SOLN
INTRAMUSCULAR | Status: AC
  Filled 2021-03-22: qty 30

## 2021-03-22 MED ORDER — OXYCODONE HCL 5 MG PO TABS
ORAL_TABLET | ORAL | Status: AC
  Filled 2021-03-22: qty 1

## 2021-03-22 MED ORDER — METHOCARBAMOL 500 MG PO TABS
500.0000 mg | ORAL_TABLET | Freq: Once | ORAL | Status: AC
  Administered 2021-03-22: 500 mg via ORAL

## 2021-03-22 MED ORDER — PROPOFOL 10 MG/ML IV BOLUS
INTRAVENOUS | Status: DC | PRN
  Administered 2021-03-22: 160 mg via INTRAVENOUS

## 2021-03-22 MED ORDER — FENTANYL CITRATE (PF) 100 MCG/2ML IJ SOLN
25.0000 ug | INTRAMUSCULAR | Status: DC | PRN
  Administered 2021-03-22: 25 ug via INTRAVENOUS
  Administered 2021-03-22: 50 ug via INTRAVENOUS
  Administered 2021-03-22: 25 ug via INTRAVENOUS

## 2021-03-22 MED ORDER — PHENYLEPHRINE HCL-NACL 20-0.9 MG/250ML-% IV SOLN
INTRAVENOUS | Status: DC | PRN
  Administered 2021-03-22: 35 ug/min via INTRAVENOUS

## 2021-03-22 MED ORDER — METHOCARBAMOL 500 MG PO TABS: 500.0000 mg | ORAL_TABLET | Freq: Four times a day (QID) | ORAL | 0 refills | Status: DC | PRN

## 2021-03-22 MED ORDER — EPHEDRINE 5 MG/ML INJ
INTRAVENOUS | Status: AC
  Filled 2021-03-22: qty 5

## 2021-03-22 MED ORDER — PHENYLEPHRINE 40 MCG/ML (10ML) SYRINGE FOR IV PUSH (FOR BLOOD PRESSURE SUPPORT)
PREFILLED_SYRINGE | INTRAVENOUS | Status: DC | PRN
  Administered 2021-03-22: 160 ug via INTRAVENOUS
  Administered 2021-03-22 (×2): 120 ug via INTRAVENOUS
  Administered 2021-03-22: 160 ug via INTRAVENOUS

## 2021-03-22 SURGICAL SUPPLY — 73 items
AGENT HMST KT MTR STRL THRMB (HEMOSTASIS)
APL SKNCLS STERI-STRIP NONHPOA (GAUZE/BANDAGES/DRESSINGS) ×1
BAG COUNTER SPONGE SURGICOUNT (BAG) ×2 IMPLANT
BAG SPNG CNTER NS LX DISP (BAG) ×1
BENZOIN TINCTURE PRP APPL 2/3 (GAUZE/BANDAGES/DRESSINGS) ×1 IMPLANT
BNDG GAUZE ELAST 4 BULKY (GAUZE/BANDAGES/DRESSINGS) ×2 IMPLANT
BUR ROUND PRECISION 4.0 (BURR) ×2 IMPLANT
CABLE BIPOLOR RESECTION CORD (MISCELLANEOUS) ×2 IMPLANT
CANISTER SUCT 3000ML PPV (MISCELLANEOUS) ×2 IMPLANT
CARTRIDGE OIL MAESTRO DRILL (MISCELLANEOUS) ×1 IMPLANT
COVER SURGICAL LIGHT HANDLE (MISCELLANEOUS) ×2 IMPLANT
DIFFUSER DRILL AIR PNEUMATIC (MISCELLANEOUS) ×2 IMPLANT
DRAIN CHANNEL 15F RND FF W/TCR (WOUND CARE) IMPLANT
DRAPE POUCH INSTRU U-SHP 10X18 (DRAPES) ×4 IMPLANT
DRAPE SURG 17X23 STRL (DRAPES) ×8 IMPLANT
DURAPREP 26ML APPLICATOR (WOUND CARE) ×2 IMPLANT
ELECT BLADE 4.0 EZ CLEAN MEGAD (MISCELLANEOUS) ×2
ELECT CAUTERY BLADE 6.4 (BLADE) ×2 IMPLANT
ELECT REM PT RETURN 9FT ADLT (ELECTROSURGICAL) ×2
ELECTRODE BLDE 4.0 EZ CLN MEGD (MISCELLANEOUS) ×1 IMPLANT
ELECTRODE REM PT RTRN 9FT ADLT (ELECTROSURGICAL) ×1 IMPLANT
EVACUATOR SILICONE 100CC (DRAIN) IMPLANT
FILTER STRAW FLUID ASPIR (MISCELLANEOUS) ×3 IMPLANT
GAUZE 4X4 16PLY ~~LOC~~+RFID DBL (SPONGE) ×4 IMPLANT
GAUZE SPONGE 4X4 12PLY STRL (GAUZE/BANDAGES/DRESSINGS) ×2 IMPLANT
GLOVE SRG 8 PF TXTR STRL LF DI (GLOVE) ×1 IMPLANT
GLOVE SURG ENC MOIS LTX SZ6.5 (GLOVE) ×2 IMPLANT
GLOVE SURG ENC MOIS LTX SZ8 (GLOVE) ×2 IMPLANT
GLOVE SURG UNDER POLY LF SZ7 (GLOVE) ×2 IMPLANT
GLOVE SURG UNDER POLY LF SZ8 (GLOVE) ×2
GOWN STRL REUS W/ TWL LRG LVL3 (GOWN DISPOSABLE) ×1 IMPLANT
GOWN STRL REUS W/ TWL XL LVL3 (GOWN DISPOSABLE) ×2 IMPLANT
GOWN STRL REUS W/TWL LRG LVL3 (GOWN DISPOSABLE) ×2
GOWN STRL REUS W/TWL XL LVL3 (GOWN DISPOSABLE) ×4
IV CATH 14GX2 1/4 (CATHETERS) ×2 IMPLANT
KIT BASIN OR (CUSTOM PROCEDURE TRAY) ×2 IMPLANT
KIT POSITION SURG JACKSON T1 (MISCELLANEOUS) ×2 IMPLANT
KIT TURNOVER KIT B (KITS) ×2 IMPLANT
MARKER SKIN DUAL TIP RULER LAB (MISCELLANEOUS) ×2 IMPLANT
NDL 18GX1X1/2 (RX/OR ONLY) (NEEDLE) ×1 IMPLANT
NDL HYPO 25GX1X1/2 BEV (NEEDLE) ×1 IMPLANT
NDL SPNL 18GX3.5 QUINCKE PK (NEEDLE) ×2 IMPLANT
NEEDLE 18GX1X1/2 (RX/OR ONLY) (NEEDLE) ×2 IMPLANT
NEEDLE 22X1 1/2 (OR ONLY) (NEEDLE) ×2 IMPLANT
NEEDLE HYPO 25GX1X1/2 BEV (NEEDLE) ×2 IMPLANT
NEEDLE SPNL 18GX3.5 QUINCKE PK (NEEDLE) ×4 IMPLANT
NS IRRIG 1000ML POUR BTL (IV SOLUTION) ×2 IMPLANT
OIL CARTRIDGE MAESTRO DRILL (MISCELLANEOUS) ×2
PACK LAMINECTOMY ORTHO (CUSTOM PROCEDURE TRAY) ×2 IMPLANT
PACK UNIVERSAL I (CUSTOM PROCEDURE TRAY) ×2 IMPLANT
PAD ARMBOARD 7.5X6 YLW CONV (MISCELLANEOUS) ×4 IMPLANT
PATTIES SURGICAL .5 X.5 (GAUZE/BANDAGES/DRESSINGS) IMPLANT
PATTIES SURGICAL .5 X1 (DISPOSABLE) ×2 IMPLANT
SPONGE INTESTINAL PEANUT (DISPOSABLE) ×2 IMPLANT
SPONGE SURGIFOAM ABS GEL SZ50 (HEMOSTASIS) ×2 IMPLANT
STRIP CLOSURE SKIN 1/2X4 (GAUZE/BANDAGES/DRESSINGS) ×1 IMPLANT
SURGIFLO W/THROMBIN 8M KIT (HEMOSTASIS) IMPLANT
SUT BONE WAX W31G (SUTURE) ×2 IMPLANT
SUT MNCRL AB 4-0 PS2 18 (SUTURE) ×2 IMPLANT
SUT VIC AB 0 CT1 18XCR BRD 8 (SUTURE) IMPLANT
SUT VIC AB 0 CT1 8-18 (SUTURE)
SUT VIC AB 1 CT1 18XCR BRD 8 (SUTURE) ×1 IMPLANT
SUT VIC AB 1 CT1 8-18 (SUTURE) ×2
SUT VIC AB 2-0 CT2 18 VCP726D (SUTURE) ×2 IMPLANT
SYR 20ML LL LF (SYRINGE) ×2 IMPLANT
SYR BULB IRRIG 60ML STRL (SYRINGE) ×2 IMPLANT
SYR CONTROL 10ML LL (SYRINGE) ×4 IMPLANT
SYR TB 1ML 25GX5/8 (SYRINGE) ×4 IMPLANT
SYR TB 1ML LUER SLIP (SYRINGE) ×4 IMPLANT
TOWEL GREEN STERILE (TOWEL DISPOSABLE) ×2 IMPLANT
TOWEL GREEN STERILE FF (TOWEL DISPOSABLE) ×2 IMPLANT
WATER STERILE IRR 1000ML POUR (IV SOLUTION) ×2 IMPLANT
YANKAUER SUCT BULB TIP NO VENT (SUCTIONS) ×2 IMPLANT

## 2021-03-22 NOTE — Transfer of Care (Signed)
Immediate Anesthesia Transfer of Care Note  Patient: [name]  Procedure(s) Performed: LUMBAR 4 - LUMBAR 5 DECOMPRESSION (Spine Lumbar)  Patient Location: PACU  Anesthesia Type:General  Level of Consciousness: awake  Airway & Oxygen Therapy: Patient Spontanous Breathing and Patient connected to nasal cannula oxygen  Post-op Assessment: Report given to RN and Patient moving all extremities X 4  Post vital signs: Reviewed and stable  Last Vitals:  Vitals Value Taken Time  BP 115/58 03/22/21 1810  Temp    Pulse 81 03/22/21 1809  Resp 17 03/22/21 1811  SpO2 98 % 03/22/21 1809  Vitals shown include unvalidated device data.  Last Pain:  Vitals:   03/22/21 1304  TempSrc:   PainSc: 10-Worst pain ever      Patients Stated Pain Goal: 0 (03/22/21 1304)  Complications: No notable events documented.

## 2021-03-22 NOTE — Anesthesia Postprocedure Evaluation (Signed)
Anesthesia Post Note  Patient: [name]  Procedure(s) Performed: LUMBAR 4 - LUMBAR 5 DECOMPRESSION (Spine Lumbar)     Patient location during evaluation: PACU Anesthesia Type: General Level of consciousness: awake and alert Pain management: pain level controlled Vital Signs Assessment: post-procedure vital signs reviewed and stable Respiratory status: spontaneous breathing, nonlabored ventilation, respiratory function stable and patient connected to nasal cannula oxygen Cardiovascular status: blood pressure returned to baseline and stable Postop Assessment: no apparent nausea or vomiting Anesthetic complications: no   No notable events documented.  Last Vitals:  Vitals:   03/22/21 1840 03/22/21 1857  BP: 127/74 (!) 159/85  Pulse: 74 75  Resp: 14 14  Temp:  36.6 C  SpO2: 98% 98%    Last Pain:  Vitals:   03/22/21 1857  TempSrc:   PainSc: 5                  Cecile Hearing

## 2021-03-22 NOTE — Op Note (Signed)
PATIENT NAME: [name]   MEDICAL RECORD NO.:   [id number]   DATE OF BIRTH: May 08, 1945   DATE OF PROCEDURE: 03/22/2021                                      OPERATIVE REPORT     PREOPERATIVE DIAGNOSES: 1. Right-sided L5 radiculopathy. 2. 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 causing      compression of the right L5 nerve.   POSTOPERATIVE DIAGNOSES: 1. Right-sided L5 radiculopathy. 2. 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 causing      compression of the right L5 nerve.   PROCEDURES:  Right-sided L4/5 laminotomy with partial facetectomy and removal of herniated right-sided L4/5 disk fragment.   SURGEON:  Estill Bamberg, MD.   ASSISTANTJason Coop, PA-C.   ANESTHESIA:  General endotracheal anesthesia.   COMPLICATIONS:  None.   DISPOSITION:  Stable.   ESTIMATED BLOOD LOSS:  Minimal.   INDICATIONS FOR SURGERY:  Briefly, [name] is a pleasant 75 year old female, who did present to me with severe pain in the right leg.  The patient's MRI did reveal the findings outlined above. Her work-up did strongly suggest that her pain was coming from the L4-5 herniated disc fragment and her severe stenosis at L4/5.  As such, we did ultimately elect to proceed with the procedure reflected above.  The patient was fully made aware of the risks of surgery, including the risk of recurrent herniation and the need for subsequent surgery, including the possibility of a subsequent diskectomy and/or fusion.   OPERATIVE DETAILS:  On 03/22/2021, the patient was brought to surgery and general endotracheal anesthesia was administered.  The patient was placed prone on a well-padded flat Jackson bed with a spinal frame.  Antibiotics were given.  The back was prepped and draped and a time-out procedure was performed.  At this point, a midline incision was made directly over the L4/5 intervertebral space.  A curvilinear incision was made just to the right of the  midline into the fascia.  A self-retaining McCulloch retractor was placed.  The lamina of L4 and L5 was identified and subperiosteally exposed.  At this point, it was readily obvious that there was abundant facet hypertrophy and significant spinal stenosis identified, secondary to ligamentum flavum hypertrophy and facet hypertrophy.  I therefore did use a high-speed bur to remove the inferior medial aspect of the L5 lamina, and I did use a series of Kerrison punches to perform a partial facetectomy.  In this manner, I was able to partially decompress the lateral recess on the right side.  Readily identified was the traversing right L5 nerve, which was noted to be under obvious tension, and was noted to be rather erythematous.  I was able to gently gain medial retraction of the nerve.  An obvious disc protrusion was noted.  An annulotomy was performed, and subsequently, a large herniated disk fragment was readily noted.  The disc herniation was uneventfully removed in multiple fragments.   There was additional compression noted ventral to the nerve, which did seem to be secondary to chronic protruding disc fragments.  I then used a series of micropituitary rongeurs to remove additional protruding fragments that were immediately beneath the annulus. At this point, the traversing right L5 nerve was evaluated, and was noted to be free and mobile, and entirely free of any compression. I was very pleased  with the final decompression that I was able to accomplish.  At this point, the wound was copiously irrigated with normal saline.  All epidural bleeding was controlled using bipolar electrocautery in addition to Surgiflo. All bleeding was controlled at the termination of the procedure.  At this point, 20 mg of Depo-Medrol was introduced about the epidural space in the region of the right L5 nerve.  The wound was then closed in layers using #1 Vicryl followed by 2-0 Vicryl, followed by 4-0 Monocryl. Benzoin  and Steri-Strips were applied followed by a sterile dressing. All instrument counts were correct at the termination of the procedure.   Of note, Jason Coop was my assistant throughout surgery, and did aid in retraction, suctioning, and closure from start to finish.       Estill Bamberg, MD

## 2021-03-22 NOTE — H&P (Signed)
PREOPERATIVE H&P  Chief Complaint: Right leg pain  HPI: [name] is a 75 y.o. female who presents with ongoing pain in the right leg  MRI reveals severe spinal stenosis in addition to a disc herniation at L4/5  Patient has failed multiple forms of conservative care and continues to have pain (see office notes for additional details regarding the patient's full course of treatment)  Past Medical History:  Diagnosis Date   Arthritis    "all over" (07/25/2012)   Asthma    Carotid artery disease (HCC)    Environmental allergies    Fibromyalgia    GERD (gastroesophageal reflux disease)    Hyperlipidemia    Hypertension    Hypothyroidism    Peripheral vascular disease (HCC)    Post-menopausal    on HRT x 25 years   Past Surgical History:  Procedure Laterality Date   ABDOMINAL HYSTERECTOMY  ~ 1977   ANTERIOR CERVICAL DECOMPRESSION/DISCECTOMY FUSION 4 LEVELS N/A 07/23/2012   Procedure: ANTERIOR CERVICAL DECOMPRESSION/DISCECTOMY FUSION 3 LEVELS;  Surgeon: Emilee Hero, MD;  Location: [facility];  Service: Orthopedics;  Laterality: N/A;  Anterior cervical decompression fusion, cervical 3-4, cervical 4-5, cervical 5-6 with instrumentation and allograft.   FEMORAL ARTERY STENT Bilateral 2011   POSTERIOR CERVICAL FUSION/FORAMINOTOMY N/A 07/24/2012   Procedure: POSTERIOR CERVICAL FUSION/FORAMINOTOMY LEVEL 5;  Surgeon: Emilee Hero, MD;  Location: [facility];  Service: Orthopedics;  Laterality: N/A;  Posterior cervical decompression, cervical 6-7, C7-T1, T1-T2. Cervical fusion cervical 3-4, cervical 4-5, cervical 5-6, cervical 6-7, cervical 7-T1 with allograft, local autograft.   POSTERIOR FUSION CERVICAL SPINE  07/24/2012   C3-T2 posterior fusion with instrumentation and allograft, C6-T2 decompression)    TONSILLECTOMY  1960's?   Social History   Socioeconomic History   Marital status: Married    Spouse name: Not on file   Number of children: 1   Years of education: Not  on file   Highest education level: Not on file  Occupational History   Not on file  Tobacco Use   Smoking status: Some Days    Years: 30.00    Types: Cigarettes   Smokeless tobacco: Never   Tobacco comments:    (03/14/21)1-2 cigarettes a day. Some days she doesn't smoke at all.  Vaping Use   Vaping Use: Never used  Substance and Sexual Activity   Alcohol use: Not Currently   Drug use: No   Sexual activity: Yes  Other Topics Concern   Not on file  Social History Narrative   Not on file   Social Determinants of Health   Financial Resource Strain: Not on file  Food Insecurity: Not on file  Transportation Needs: Not on file  Physical Activity: Not on file  Stress: Not on file  Social Connections: Not on file   Family History  Problem Relation Age of Onset   Heart failure Mother    Hypertension Father    Hypertension Sister    Hypertension Brother    Hypertension Brother    Heart disease Brother    Allergies  Allergen Reactions   Naloxegol Other (See Comments)    Unknown reaction    Pine     Seasonal allergy   Codeine Itching   Prior to Admission medications   Medication Sig Start Date End Date Taking? Authorizing Provider  amitriptyline (ELAVIL) 150 MG tablet Take 150 mg by mouth at bedtime.   Yes [provider]  amLODipine (NORVASC) 10 MG tablet Take 10 mg  by mouth daily.   Yes [provider]  clopidogrel (PLAVIX) 75 MG tablet Take 75 mg by mouth daily.   Yes [provider]  estrogens, conjugated, (PREMARIN) 0.625 MG tablet Take 0.625 mg by mouth daily. Take daily for 21 days then do not take for 7 days.   Yes [provider]  Evolocumab (REPATHA SURECLICK) 140 MG/ML SOAJ Take 1 mL by mouth every 14 (fourteen) days.   Yes [provider]  Fluticasone-Salmeterol (ADVAIR) 100-50 MCG/DOSE AEPB Inhale 1 puff into the lungs 2 (two) times daily as needed (shortness of breath).   Yes [provider]  levothyroxine  (SYNTHROID) 100 MCG tablet Take 100 mcg by mouth daily before breakfast.   Yes [provider]  losartan (COZAAR) 50 MG tablet Take 50 mg by mouth daily.   Yes [provider]  methotrexate (RHEUMATREX) 2.5 MG tablet Take 20 mg by mouth every Tuesday. 06/02/19  Yes [provider]  Multiple Vitamin (MULTIVITAMIN WITH MINERALS) TABS tablet Take 1 tablet by mouth daily.   Yes [provider]  pantoprazole (PROTONIX) 40 MG tablet Take 40 mg by mouth daily.   Yes [provider]  pregabalin (LYRICA) 50 MG capsule Take 50 mg by mouth daily as needed for pain. 01/26/21  Yes [provider]  traMADol (ULTRAM) 50 MG tablet Take 50 mg by mouth 2 (two) times daily as needed for moderate pain. 06/03/19  Yes [provider]  HYDROcodone-acetaminophen (NORCO/VICODIN) 5-325 MG tablet Take 1 tablet by mouth 2 (two) times daily as needed for moderate pain. Patient not taking: Reported on 03/13/2021 12/28/20   Valeria Batman, MD     All other systems have been reviewed and were otherwise negative with the exception of those mentioned in the HPI and as above.  Physical Exam: There were no vitals filed for this visit.  There is no height or weight on file to calculate BMI.  General: Alert, no acute distress Cardiovascular: No pedal edema Respiratory: No cyanosis, no use of accessory musculature Skin: No lesions in the area of chief complaint Neurologic: Sensation intact distally Psychiatric: Patient is competent for consent with normal mood and affect Lymphatic: No axillary or cervical lymphadenopathy   Assessment/Plan: L4/5 SPINAL STENOSIS, DISC HERNIATION, LUMBAR RADICULOPATHY Plan for Procedure(s): LUMBAR 4 - LUMBAR 5 DECOMPRESSION   Jackelyn Hoehn, MD 03/22/2021 11:10 AM

## 2021-03-22 NOTE — Anesthesia Procedure Notes (Signed)
Procedure Name: Intubation Date/Time: 03/22/2021 3:26 PM Performed by: Barrington Ellison, CRNA Pre-anesthesia Checklist: Patient identified, Emergency Drugs available, Suction available and Patient being monitored Patient Re-evaluated:Patient Re-evaluated prior to induction Oxygen Delivery Method: Circle System Utilized Preoxygenation: Pre-oxygenation with 100% oxygen Induction Type: IV induction Ventilation: Mask ventilation without difficulty Laryngoscope Size: Mac and 3 Grade View: Grade I Tube type: Oral Tube size: 7.0 mm Number of attempts: 1 Airway Equipment and Method: Stylet and Oral airway Placement Confirmation: ETT inserted through vocal cords under direct vision, positive ETCO2 and breath sounds checked- equal and bilateral Secured at: 21 cm Tube secured with: Tape Dental Injury: Teeth and Oropharynx as per pre-operative assessment

## 2021-03-23 ENCOUNTER — Encounter (HOSPITAL_COMMUNITY): Payer: Self-pay | Admitting: Orthopedic Surgery

## 2021-03-23 MED FILL — Thrombin (Recombinant) For Soln 20000 Unit: CUTANEOUS | Qty: 1 | Status: AC

## 2021-04-20 ENCOUNTER — Other Ambulatory Visit: Payer: Self-pay

## 2021-04-20 ENCOUNTER — Ambulatory Visit
Admission: RE | Admit: 2021-04-20 | Discharge: 2021-04-20 | Disposition: A | Discharge status: Home / Self Care | Payer: Medicare HMO | Source: Ambulatory Visit | Attending: Student | Admitting: Student

## 2021-04-20 ENCOUNTER — Telehealth: Payer: Self-pay

## 2021-04-20 DIAGNOSIS — I6523 Occlusion and stenosis of bilateral carotid arteries: Secondary | ICD-10-CM

## 2021-04-20 DIAGNOSIS — I6503 Occlusion and stenosis of bilateral vertebral arteries: Secondary | ICD-10-CM | POA: Diagnosis not present

## 2021-04-20 DIAGNOSIS — I672 Cerebral atherosclerosis: Secondary | ICD-10-CM | POA: Diagnosis not present

## 2021-04-20 DIAGNOSIS — E890 Postprocedural hypothyroidism: Secondary | ICD-10-CM | POA: Diagnosis not present

## 2021-04-20 MED ORDER — IOPAMIDOL (ISOVUE-370) INJECTION 76%
75.0000 mL | Freq: Once | INTRAVENOUS | Status: AC | PRN
  Administered 2021-04-20: 75 mL via INTRAVENOUS

## 2021-04-20 NOTE — Telephone Encounter (Signed)
[facility] called for report on pts CT angio neck. Impression shows sever stenosis at the right common carotid bifurcation due to bulky calcified plaque with down stream under fillings. String sign, 70% stenosis at the left vertebral origin. Left vertebral artery is non-dominant.

## 2021-06-02 DIAGNOSIS — L720 Epidermal cyst: Secondary | ICD-10-CM | POA: Diagnosis not present

## 2021-06-02 DIAGNOSIS — L853 Xerosis cutis: Secondary | ICD-10-CM | POA: Diagnosis not present

## 2021-06-16 ENCOUNTER — Encounter: Payer: Self-pay | Admitting: Physician Assistant

## 2021-06-16 ENCOUNTER — Other Ambulatory Visit: Payer: Self-pay

## 2021-06-16 ENCOUNTER — Ambulatory Visit (INDEPENDENT_AMBULATORY_CARE_PROVIDER_SITE_OTHER): Payer: Medicare HMO | Admitting: Physician Assistant

## 2021-06-16 DIAGNOSIS — M25561 Pain in right knee: Secondary | ICD-10-CM | POA: Diagnosis not present

## 2021-06-16 DIAGNOSIS — G8929 Other chronic pain: Secondary | ICD-10-CM | POA: Diagnosis not present

## 2021-06-16 MED ORDER — METHYLPREDNISOLONE ACETATE 40 MG/ML IJ SUSP
80.0000 mg | INTRAMUSCULAR | Status: AC | PRN
  Administered 2021-06-16: 80 mg via INTRA_ARTICULAR

## 2021-06-16 MED ORDER — LIDOCAINE HCL 1 % IJ SOLN
5.0000 mL | INTRAMUSCULAR | Status: AC | PRN
  Administered 2021-06-16: 5 mL

## 2021-06-16 NOTE — Progress Notes (Signed)
? ?Office Visit Note ?  ?Patient: [name]           ?Date of Birth: December 26, 1945           ?MRN: [id number] ?Visit Date: 06/16/2021 ?             ?Requested by: Janie Morning, DO ?[address] ?[address] ?[place],  [place] [address] ?PCP: Janie Morning, DO ? ?Chief Complaint  ?Patient presents with  ? Right Knee - Pain  ? ? ? ? ?HPI: ?Patient is a pleasant 76 year old woman who comes in for periodic injections into her right knee.  She has a history of right knee arthritis.  Her last injection was last August.  She is requesting another one today ? ?Assessment & Plan: ?Visit Diagnoses: No diagnosis found. ? ?Plan: Right knee was injected without difficulty.  She understands that she can get 1 of these injections every 3 months as needed.  She will follow-up if she has any concerns ? ?Follow-Up Instructions: No follow-ups on file.  ? ?Ortho Exam ? ?Patient is alert, oriented, no adenopathy, well-dressed, normal affect, normal respiratory effort. ?Examination of her right knee she has no redness no effusion.  She has grinding with range of motion.  Global tenderness throughout the knee.  Compartments are soft and nontender ? ?Imaging: ?No results found. ?No images are attached to the encounter. ? ?Labs: ?No results found for: HGBA1C, ESRSEDRATE, CRP, LABURIC, REPTSTATUS, GRAMSTAIN, CULT, LABORGA ? ? ?Lab Results  ?Component Value Date  ? ALBUMIN 4.1 03/14/2021  ? ALBUMIN 4.0 07/17/2012  ? ? ?No results found for: MG ?No results found for: VD25OH ? ?No results found for: PREALBUMIN ?CBC EXTENDED Latest Ref Rng & Units 03/14/2021 07/17/2012 06/21/2010  ?WBC 4.0 - 10.5 K/uL 5.3 5.3 7.5  ?RBC 3.87 - 5.11 MIL/uL 4.43 4.50 3.33(L)  ?HGB 12.0 - 15.0 g/dL 13.9 13.4 9.7(L)  ?HCT 36.0 - 46.0 % 41.1 39.1 28.8(L)  ?PLT 150 - 400 K/uL 259 243 207  ?NEUTROABS 1.7 - 7.7 K/uL 2.6 2.5 -  ?LYMPHSABS 0.7 - 4.0 K/uL 2.0 2.2 -  ? ? ? ?There is no height or weight on file to calculate BMI. ? ?Orders:  ?No orders of the defined types were  placed in this encounter. ? ?No orders of the defined types were placed in this encounter. ? ? ? Procedures: ?Large Joint Inj: R knee on 06/16/2021 9:22 AM ?Indications: pain and diagnostic evaluation ?Details: 25 G 1.5 in needle ? ?Arthrogram: No ? ?Medications: 80 mg methylPREDNISolone acetate 40 MG/ML; 5 mL lidocaine 1 % ?Outcome: tolerated well, no immediate complications ?Procedure, treatment alternatives, risks and benefits explained, specific risks discussed. Consent was given by the patient.  ? ? ? ?Clinical Data: ?No additional findings. ? ?ROS: ? ?All other systems negative, except as noted in the HPI. ?Review of Systems ? ?Objective: ?Vital Signs: There were no vitals taken for this visit. ? ?Specialty Comments:  ?No specialty comments available. ? ?PMFS History: ?Patient Active Problem List  ? Diagnosis Date Noted  ? Low back pain 12/28/2020  ? Bilateral primary osteoarthritis of knee 10/29/2019  ? ?Past Medical History:  ?Diagnosis Date  ? Arthritis   ? "all over" (07/25/2012)  ? Asthma   ? Carotid artery disease ([facility])   ? Environmental allergies   ? Fibromyalgia   ? GERD (gastroesophageal reflux disease)   ? Hyperlipidemia   ? Hypertension   ? Hypothyroidism   ? Peripheral vascular disease ([facility])   ?  Post-menopausal   ? on HRT x 25 years  ?  ?Family History  ?Problem Relation Age of Onset  ? Heart failure Mother   ? Hypertension Father   ? Hypertension Sister   ? Hypertension Brother   ? Hypertension Brother   ? Heart disease Brother   ?  ?Past Surgical History:  ?Procedure Laterality Date  ? ABDOMINAL HYSTERECTOMY  ~ 1977  ? ANTERIOR CERVICAL DECOMPRESSION/DISCECTOMY FUSION 4 LEVELS N/A 07/23/2012  ? Procedure: ANTERIOR CERVICAL DECOMPRESSION/DISCECTOMY FUSION 3 LEVELS;  Surgeon: Sinclair Ship, MD;  Location: [facility];  Service: Orthopedics;  Laterality: N/A;  Anterior cervical decompression fusion, cervical 3-4, cervical 4-5, cervical 5-6 with instrumentation and allograft.  ? FEMORAL ARTERY STENT  Bilateral 2011  ? LUMBAR LAMINECTOMY/DECOMPRESSION MICRODISCECTOMY N/A 03/22/2021  ? Procedure: LUMBAR 4 - LUMBAR 5 DECOMPRESSION;  Surgeon: Phylliss Bob, MD;  Location: [facility];  Service: Orthopedics;  Laterality: N/A;  ? POSTERIOR CERVICAL FUSION/FORAMINOTOMY N/A 07/24/2012  ? Procedure: POSTERIOR CERVICAL FUSION/FORAMINOTOMY LEVEL 5;  Surgeon: Sinclair Ship, MD;  Location: [facility];  Service: Orthopedics;  Laterality: N/A;  Posterior cervical decompression, cervical 6-7, C7-T1, T1-T2. Cervical fusion cervical 3-4, cervical 4-5, cervical 5-6, cervical 6-7, cervical 7-T1 with allograft, local autograft.  ? POSTERIOR FUSION CERVICAL SPINE  07/24/2012  ? C3-T2 posterior fusion with instrumentation and allograft, C6-T2 decompression)   ? TONSILLECTOMY  1960's?  ? ?Social History  ? ?Occupational History  ? Not on file  ?Tobacco Use  ? Smoking status: Some Days  ?  Years: 30.00  ?  Types: Cigarettes  ? Smokeless tobacco: Never  ? Tobacco comments:  ?  (03/14/21)1-2 cigarettes a day. Some days she doesn't smoke at all.  ?Vaping Use  ? Vaping Use: Never used  ?Substance and Sexual Activity  ? Alcohol use: Not Currently  ? Drug use: No  ? Sexual activity: Yes  ? ? ? ? ? ?

## 2021-06-19 NOTE — Progress Notes (Signed)
Primary Physician/Referring:  Janie Morning, DO  Patient ID: [name], female    DOB: 1946-01-28, 76 y.o.   MRN: [id number]  Chief Complaint  Patient presents with   PAD   Coronary Artery Disease   Follow-up    1 year   HPI:    [name]  is a 76 y.o. African-American female with peripheral arterial disease, history of bilateral SFA angioplasty in 2012, essential hypertension, hyperlipidemia, tobacco use disorder (quit 1 year ago).  Patient presents for 1 year follow-up.  Repeat lipid profile testing following last office visit revealed lipids are under excellent control.  Patient has also undergone CTA of the neck to further evaluate carotid stenosis which revealed severe stenosis of the right common carotid bifurcation as well as 70% stenosis of the left vertebral origin.   Overall patient states she is doing well.  She has had significant improvement of back and leg pain following surgical intervention for spinal stenosis.  Patient is congratulated as she states she has not smoked cigarettes in the last 1 year.  Denies chest pain, dyspnea.  Patient lives independently and is able to keep her house without issue.  Past Medical History:  Diagnosis Date   Arthritis    "all over" (07/25/2012)   Asthma    Carotid artery disease (HCC)    Environmental allergies    Fibromyalgia    GERD (gastroesophageal reflux disease)    Hyperlipidemia    Hypertension    Hypothyroidism    Peripheral vascular disease ([place])    Post-menopausal    on HRT x 25 years   Past Surgical History:  Procedure Laterality Date   ABDOMINAL HYSTERECTOMY  ~ 1977   ANTERIOR CERVICAL DECOMPRESSION/DISCECTOMY FUSION 4 LEVELS N/A 07/23/2012   Procedure: ANTERIOR CERVICAL DECOMPRESSION/DISCECTOMY FUSION 3 LEVELS;  Surgeon: Sinclair Ship, MD;  Location: [facility];  Service: Orthopedics;  Laterality: N/A;  Anterior cervical decompression fusion, cervical 3-4, cervical 4-5, cervical 5-6 with instrumentation and  allograft.   FEMORAL ARTERY STENT Bilateral 2011   LUMBAR LAMINECTOMY/DECOMPRESSION MICRODISCECTOMY N/A 03/22/2021   Procedure: LUMBAR 4 - LUMBAR 5 DECOMPRESSION;  Surgeon: Phylliss Bob, MD;  Location: [facility];  Service: Orthopedics;  Laterality: N/A;   POSTERIOR CERVICAL FUSION/FORAMINOTOMY N/A 07/24/2012   Procedure: POSTERIOR CERVICAL FUSION/FORAMINOTOMY LEVEL 5;  Surgeon: Sinclair Ship, MD;  Location: [facility];  Service: Orthopedics;  Laterality: N/A;  Posterior cervical decompression, cervical 6-7, C7-T1, T1-T2. Cervical fusion cervical 3-4, cervical 4-5, cervical 5-6, cervical 6-7, cervical 7-T1 with allograft, local autograft.   POSTERIOR FUSION CERVICAL SPINE  07/24/2012   C3-T2 posterior fusion with instrumentation and allograft, C6-T2 decompression)    TONSILLECTOMY  1960's?   Social History   Tobacco Use   Smoking status: Former    Packs/day: 0.25    Years: 30.00    Pack years: 7.50    Types: Cigarettes    Quit date: 12/15/2020    Years since quitting: 0.5   Smokeless tobacco: Never   Tobacco comments:    (03/14/21)1-2 cigarettes a day. Some days she doesn't smoke at all.  Substance Use Topics   Alcohol use: Not Currently    ROS  Review of Systems  Cardiovascular:  Negative for chest pain, claudication, leg swelling, near-syncope, orthopnea, palpitations, paroxysmal nocturnal dyspnea and syncope.  Respiratory:  Negative for shortness of breath.   Neurological:  Negative for dizziness.  Objective  Blood pressure 136/70, pulse 77, temperature 97.7 F (36.5 C), temperature source Temporal, resp. rate 16,  height 5' 7"  (1.702 m), weight 149 lb (67.6 kg), SpO2 98 %.  Vitals with BMI 06/20/2021 06/20/2021 06/20/2021  Height - - 5' 7"   Weight - - 149 lbs  BMI - - 24.23  Systolic 536 144 315  Diastolic 70 83 64  Pulse - 77 98     Physical Exam Vitals reviewed.  Neck:     Thyroid: No thyromegaly.  Cardiovascular:     Rate and Rhythm: Normal rate and regular rhythm.      Pulses: Intact distal pulses.          Carotid pulses are 2+ on the right side with bruit and 2+ on the left side.      Radial pulses are 2+ on the right side and 2+ on the left side.       Femoral pulses are 2+ on the right side and 2+ on the left side.      Popliteal pulses are 1+ on the right side and 1+ on the left side.       Dorsalis pedis pulses are 1+ on the right side and 1+ on the left side.       Posterior tibial pulses are 0 on the right side and 0 on the left side.     Heart sounds: Murmur heard.  Scratchy early systolic murmur is present with a grade of 2/6 at the upper right sternal border.    No gallop.     Comments: No open wounds or ulcers. Pulmonary:     Effort: Pulmonary effort is normal.     Breath sounds: Normal breath sounds.  Musculoskeletal:     Cervical back: Neck supple.     Right lower leg: No edema.     Left lower leg: No edema.   Laboratory examination:   Recent Labs    03/14/21 0843  NA 137  K 3.1*  CL 101  CO2 30  GLUCOSE 102*  BUN 9  CREATININE 0.61  CALCIUM 9.9  GFRNONAA >60   CrCl cannot be calculated (Patient's most recent lab result is older than the maximum 21 days allowed.).  CMP Latest Ref Rng & Units 03/14/2021 07/17/2012 06/21/2010  Glucose 70 - 99 mg/dL 102(H) 93 100(H)  BUN 8 - 23 mg/dL 9 13 11   Creatinine 0.44 - 1.00 mg/dL 0.61 0.53 0.48  Sodium 135 - 145 mmol/L 137 139 139  Potassium 3.5 - 5.1 mmol/L 3.1(L) 4.0 3.9  Chloride 98 - 111 mmol/L 101 103 104  CO2 22 - 32 mmol/L 30 28 30   Calcium 8.9 - 10.3 mg/dL 9.9 10.2 9.0  Total Protein 6.5 - 8.1 g/dL 7.3 7.4 -  Total Bilirubin 0.3 - 1.2 mg/dL 0.9 0.2(L) -  Alkaline Phos 38 - 126 U/L 49 78 -  AST 15 - 41 U/L 17 17 -  ALT 0 - 44 U/L 11 9 -   CBC Latest Ref Rng & Units 03/14/2021 07/17/2012 06/21/2010  WBC 4.0 - 10.5 K/uL 5.3 5.3 7.5  Hemoglobin 12.0 - 15.0 g/dL 13.9 13.4 9.7(L)  Hematocrit 36.0 - 46.0 % 41.1 39.1 28.8(L)  Platelets 150 - 400 K/uL 259 243 207   Lipid Panel      Component Value Date/Time   CHOL 104 07/13/2020 0847   TRIG 59 07/13/2020 0847   HDL 76 07/13/2020 0847   LDLCALC 15 07/13/2020 0847   HEMOGLOBIN A1C No results found for: HGBA1C, MPG TSH No results for input(s): TSH in the last 8760 hours.  05/31/2020:  Hemoglobin 13.3, hematocrit 39.6, MCV 91.2, platelets 291 Glucose 103, BUN 11, creatinine 0.69, GFR 86, sodium 140, potassium 3.9, AST 14, ALT 7 alk phos 83  Labs  01/05/19  Cholesterol, total 105 HDL 66 01/05/2019 LDL 29 05/31/2016 Triglycerides 65.000 01/05/2019  Creatinine, Serum 0.500 MG/ 01/05/2019 Potassium 4.400 MM 05/31/2016 Magnesium N/D ALT (SGPT) 11.000 IU/ 01/05/2019 TSH 1.360 01/05/2019  Allergies   Allergies  Allergen Reactions   Naloxegol Other (See Comments)    Unknown reaction    Pine     Seasonal allergy   Codeine Itching    Medications Prior to Visit:   Outpatient Medications Prior to Visit  Medication Sig Dispense Refill   amitriptyline (ELAVIL) 150 MG tablet Take 150 mg by mouth at bedtime.     amLODipine (NORVASC) 10 MG tablet Take 10 mg by mouth daily.     estrogens, conjugated, (PREMARIN) 0.625 MG tablet Take 0.625 mg by mouth daily. Take daily for 21 days then do not take for 7 days.     Evolocumab (REPATHA SURECLICK) 536 MG/ML SOAJ Take 1 mL by mouth every 14 (fourteen) days.     Fluticasone-Salmeterol (ADVAIR) 100-50 MCG/DOSE AEPB Inhale 1 puff into the lungs 2 (two) times daily as needed (shortness of breath).     HYDROcodone-acetaminophen (NORCO/VICODIN) 5-325 MG tablet Take 1 tablet by mouth every 6 (six) hours as needed for moderate pain or severe pain. 20 tablet 0   levothyroxine (SYNTHROID) 100 MCG tablet Take 100 mcg by mouth daily before breakfast.     losartan (COZAAR) 50 MG tablet Take 50 mg by mouth daily.     Multiple Vitamin (MULTIVITAMIN WITH MINERALS) TABS tablet Take 1 tablet by mouth daily.     pantoprazole (PROTONIX) 40 MG tablet Take 40 mg by mouth daily.     pregabalin (LYRICA) 50 MG  capsule Take 50 mg by mouth daily as needed for pain.     traMADol (ULTRAM) 50 MG tablet Take 50 mg by mouth 2 (two) times daily as needed for moderate pain.     methocarbamol (ROBAXIN) 500 MG tablet Take 1 tablet (500 mg total) by mouth every 6 (six) hours as needed for muscle spasms. 60 tablet 0   methotrexate (RHEUMATREX) 2.5 MG tablet Take 20 mg by mouth every Tuesday.     No facility-administered medications prior to visit.   Final Medications at End of Visit    Current Meds  Medication Sig   amitriptyline (ELAVIL) 150 MG tablet Take 150 mg by mouth at bedtime.   amLODipine (NORVASC) 10 MG tablet Take 10 mg by mouth daily.   estrogens, conjugated, (PREMARIN) 0.625 MG tablet Take 0.625 mg by mouth daily. Take daily for 21 days then do not take for 7 days.   Evolocumab (REPATHA SURECLICK) 644 MG/ML SOAJ Take 1 mL by mouth every 14 (fourteen) days.   Fluticasone-Salmeterol (ADVAIR) 100-50 MCG/DOSE AEPB Inhale 1 puff into the lungs 2 (two) times daily as needed (shortness of breath).   HYDROcodone-acetaminophen (NORCO/VICODIN) 5-325 MG tablet Take 1 tablet by mouth every 6 (six) hours as needed for moderate pain or severe pain.   levothyroxine (SYNTHROID) 100 MCG tablet Take 100 mcg by mouth daily before breakfast.   losartan (COZAAR) 50 MG tablet Take 50 mg by mouth daily.   Multiple Vitamin (MULTIVITAMIN WITH MINERALS) TABS tablet Take 1 tablet by mouth daily.   pantoprazole (PROTONIX) 40 MG tablet Take 40 mg by mouth daily.   pregabalin (LYRICA) 50 MG capsule Take 50  mg by mouth daily as needed for pain.   traMADol (ULTRAM) 50 MG tablet Take 50 mg by mouth 2 (two) times daily as needed for moderate pain.   Radiology:  No results found.  Cardiac Studies:   Peripheral arteriogram 05/29/2010: Right SFA 6.0 x 120, 6.0 x 60, 6.0 x 40 mm ever flex self-expanding stents.  Peripheral arteriogram 06/20/2010: Left SFA atherectomy with diamondback 1.5 mm bur followed by balloon  angioplasty.  ABI 05/11/2019:  This exam reveals mildly decreased perfusion of the right and left lower extremity, noted at the post tibial artery level (ABI 0.81).  Multiphasic waveform at the level of the ankles.  No significant change in ABI from 01/26/2013.  Carotid artery duplex  12/22/2019:  Stenosis in the right internal carotid artery (50-69%). Stenosis in the right external carotid artery (<50%).  Stenosis in the left internal carotid artery (50-69%). Stenosis in the left external carotid artery (<50%).  Antegrade right vertebral artery flow. Antegrade left vertebral artery flow.  Follow up in six months is appropriate if clinically indicated. Compared to 05/11/2019, right ICA stenosis mildly progressed.   CT angio neck 04/20/2021: 1. Severe stenosis at the right common carotid bifurcation due to bulky calcified plaque with downstream underfilling/string sign. 2. 70% stenosis at the left vertebral origin (which is from the arch). The left vertebral artery is non dominant.  EKG  06/20/2021: Sinus rhythm at a rate of 70 bpm.  Normal axis.  Left atrial enlargement.  Poor R wave progression, cannot exclude anteroseptal infarct old.  Low voltage complexes, consider pulmonary disease pattern.  Compared EKG 06/20/2020, no significant change.  EKG 05/06/2019: Normal sinus rhythm at rate of 91 bpm, left atrial enlargement, normal axis.  Two PVCs.  Low-voltage Axis.  Assessment     ICD-10-CM   1. Bilateral carotid artery stenosis  I65.23 EKG 12-Lead    Ambulatory referral to Vascular Surgery    2. Hypercholesteremia  E78.00     3. Claudication in peripheral vascular disease (HCC)  I73.9           No orders of the defined types were placed in this encounter.   Medications Discontinued During This Encounter  Medication Reason   methocarbamol (ROBAXIN) 500 MG tablet    methotrexate (RHEUMATREX) 2.5 MG tablet Change in therapy     Recommendations:   [name]  is a 76 y.o.  African-American female with peripheral arterial disease with bilateral SFA angioplasty in 2012, essential hypertension, hyperlipidemia, tobacco use disorder (quit 1 year ago).    Patient presents for 1 year follow-up.  Repeat lipid profile testing following last office visit revealed lipids are under excellent control.  Patient has also undergone CTA of the neck to further evaluate carotid stenosis which revealed severe stenosis of the right common carotid bifurcation as well as 70% stenosis of the left vertebral origin.  Reviewed and discussed results of CTA, shared decision was to refer patient to vascular surgery for evaluation of bilateral carotid stenosis given its severity.  For now we will continue medical management.   His blood pressure was initially elevated in the office today, however it is controlled upon recheck.  Will not make changes to medications at this time, continue amlodipine, Repatha, losartan.   Follow up in 6 months, sooner if needed.    Alethia Berthold, PA-C 06/20/2021, 9:20 AM Office: [phone]

## 2021-06-20 ENCOUNTER — Encounter: Payer: Self-pay | Admitting: Student

## 2021-06-20 ENCOUNTER — Ambulatory Visit: Payer: Medicare HMO | Admitting: Student

## 2021-06-20 ENCOUNTER — Other Ambulatory Visit: Payer: Self-pay

## 2021-06-20 VITALS — BP 136/70 | HR 77 | Temp 97.7°F | Resp 16 | Ht 67.0 in | Wt 149.0 lb

## 2021-06-20 DIAGNOSIS — I10 Essential (primary) hypertension: Secondary | ICD-10-CM | POA: Diagnosis not present

## 2021-06-20 DIAGNOSIS — E7800 Pure hypercholesterolemia, unspecified: Secondary | ICD-10-CM | POA: Diagnosis not present

## 2021-06-20 DIAGNOSIS — I6523 Occlusion and stenosis of bilateral carotid arteries: Secondary | ICD-10-CM | POA: Diagnosis not present

## 2021-06-20 DIAGNOSIS — I739 Peripheral vascular disease, unspecified: Secondary | ICD-10-CM

## 2021-07-05 DIAGNOSIS — M25562 Pain in left knee: Secondary | ICD-10-CM | POA: Diagnosis not present

## 2021-07-05 DIAGNOSIS — M4800 Spinal stenosis, site unspecified: Secondary | ICD-10-CM | POA: Diagnosis not present

## 2021-07-05 DIAGNOSIS — K219 Gastro-esophageal reflux disease without esophagitis: Secondary | ICD-10-CM | POA: Diagnosis not present

## 2021-07-05 DIAGNOSIS — M1990 Unspecified osteoarthritis, unspecified site: Secondary | ICD-10-CM | POA: Diagnosis not present

## 2021-07-05 DIAGNOSIS — Z79899 Other long term (current) drug therapy: Secondary | ICD-10-CM | POA: Diagnosis not present

## 2021-07-05 DIAGNOSIS — M25561 Pain in right knee: Secondary | ICD-10-CM | POA: Diagnosis not present

## 2021-07-05 DIAGNOSIS — M797 Fibromyalgia: Secondary | ICD-10-CM | POA: Diagnosis not present

## 2021-07-05 DIAGNOSIS — L4050 Arthropathic psoriasis, unspecified: Secondary | ICD-10-CM | POA: Diagnosis not present

## 2021-07-31 ENCOUNTER — Other Ambulatory Visit: Payer: Self-pay | Admitting: *Deleted

## 2021-07-31 DIAGNOSIS — R0989 Other specified symptoms and signs involving the circulatory and respiratory systems: Secondary | ICD-10-CM

## 2021-08-07 ENCOUNTER — Encounter: Payer: Self-pay | Admitting: Surgery

## 2021-08-07 ENCOUNTER — Ambulatory Visit (HOSPITAL_COMMUNITY)
Admission: RE | Admit: 2021-08-07 | Discharge: 2021-08-07 | Disposition: A | Discharge status: Home / Self Care | Payer: Medicare HMO | Source: Ambulatory Visit | Attending: Surgery | Admitting: Surgery

## 2021-08-07 ENCOUNTER — Other Ambulatory Visit: Payer: Self-pay

## 2021-08-07 ENCOUNTER — Ambulatory Visit (INDEPENDENT_AMBULATORY_CARE_PROVIDER_SITE_OTHER): Payer: Medicare HMO | Admitting: Surgery

## 2021-08-07 ENCOUNTER — Encounter (HOSPITAL_COMMUNITY): Payer: Self-pay

## 2021-08-07 VITALS — BP 153/85 | HR 83 | Temp 98.2°F | Resp 20 | Ht 67.0 in | Wt 149.0 lb

## 2021-08-07 DIAGNOSIS — I6523 Occlusion and stenosis of bilateral carotid arteries: Secondary | ICD-10-CM | POA: Diagnosis not present

## 2021-08-07 DIAGNOSIS — R0989 Other specified symptoms and signs involving the circulatory and respiratory systems: Secondary | ICD-10-CM

## 2021-08-07 NOTE — Progress Notes (Signed)
? ?[facility] ? ?Patient name: [name] MRN: [id number] DOB: 04/03/1946 Sex: female ? ? ?REQUESTING PROVIDER:  ? ? Celeste Cantwell ? ? ?REASON FOR CONSULT:  ?  ?Carotid stenosis ? ?HISTORY OF PRESENT ILLNESS:  ? ?[name] is a 76 y.o. female, who is here today for evaluation of carotid stenosis.  She comes with a CT angiogram of the neck that shows a carotid bifurcation 70% stenosis on the right and nonopacification of the common carotid artery, suggestive of severe stenosis.  She is asymptomatic.  Specifically, she denies numbness and weakness in either extremity.  She denies slurred speech.  She denies amaurosis fugax. ? ?Patient has a history of peripheral vascular disease, status post bilateral superficial femoral artery angioplasty in 2012.  She is medically managed for hypertension.  She takes Repatha for hypercholesterolemia.  She is a former smoker. ? ?PAST MEDICAL HISTORY  ? ? ?Past Medical History:  ?Diagnosis Date  ? Arthritis   ? "all over" (07/25/2012)  ? Asthma   ? Carotid artery disease (HCC)   ? Environmental allergies   ? Fibromyalgia   ? GERD (gastroesophageal reflux disease)   ? Hyperlipidemia   ? Hypertension   ? Hypothyroidism   ? Peripheral vascular disease (HCC)   ? Post-menopausal   ? on HRT x 25 years  ? ? ? ?FAMILY HISTORY  ? ?Family History  ?Problem Relation Age of Onset  ? Heart failure Mother   ? Hypertension Father   ? Hypertension Sister   ? Hypertension Brother   ? Hypertension Brother   ? Heart disease Brother   ? ? ?SOCIAL HISTORY:  ? ?Social History  ? ?Socioeconomic History  ? Marital status: Married  ?  Spouse name: Not on file  ? Number of children: 1  ? Years of education: Not on file  ? Highest education level: Not on file  ?Occupational History  ? Not on file  ?Tobacco Use  ? Smoking status: Former  ?  Packs/day: 0.25  ?  Years: 30.00  ?  Pack years: 7.50  ?  Types: Cigarettes  ?  Quit date: 12/15/2020   ?  Years since quitting: 0.6  ? Smokeless tobacco: Never  ? Tobacco comments:  ?  (03/14/21)1-2 cigarettes a day. Some days she doesn't smoke at all.  ?Vaping Use  ? Vaping Use: Never used  ?Substance and Sexual Activity  ? Alcohol use: Not Currently  ? Drug use: No  ? Sexual activity: Yes  ?Other Topics Concern  ? Not on file  ?Social History Narrative  ? Not on file  ? ?Social Determinants of Health  ? ?Financial Resource Strain: Not on file  ?Food Insecurity: Not on file  ?Transportation Needs: Not on file  ?Physical Activity: Not on file  ?Stress: Not on file  ?Social Connections: Not on file  ?Intimate Partner Violence: Not on file  ? ? ?ALLERGIES:  ? ? ?Allergies  ?Allergen Reactions  ? Naloxegol Other (See Comments)  ?  Unknown reaction   ? Pine   ?  Seasonal allergy  ? Codeine Itching  ? ? ?CURRENT MEDICATIONS:  ? ? ?Current Outpatient Medications  ?Medication Sig Dispense Refill  ? amitriptyline (ELAVIL) 150 MG tablet Take 150 mg by mouth at bedtime.    ? amLODipine (NORVASC) 10 MG tablet Take 10 mg by mouth daily.    ? estrogens, conjugated, (PREMARIN) 0.625 MG tablet Take 0.625 mg by mouth daily. Take daily  for 21 days then do not take for 7 days.    ? Evolocumab (REPATHA SURECLICK) 140 MG/ML SOAJ Take 1 mL by mouth every 14 (fourteen) days.    ? Fluticasone-Salmeterol (ADVAIR) 100-50 MCG/DOSE AEPB Inhale 1 puff into the lungs 2 (two) times daily as needed (shortness of breath).    ? HYDROcodone-acetaminophen (NORCO/VICODIN) 5-325 MG tablet Take 1 tablet by mouth every 6 (six) hours as needed for moderate pain or severe pain. 20 tablet 0  ? levothyroxine (SYNTHROID) 100 MCG tablet Take 100 mcg by mouth daily before breakfast.    ? losartan (COZAAR) 50 MG tablet Take 50 mg by mouth daily.    ? Multiple Vitamin (MULTIVITAMIN WITH MINERALS) TABS tablet Take 1 tablet by mouth daily.    ? pantoprazole (PROTONIX) 40 MG tablet Take 40 mg by mouth daily.    ? pregabalin (LYRICA) 50 MG capsule Take 50 mg by mouth  daily as needed for pain.    ? traMADol (ULTRAM) 50 MG tablet Take 50 mg by mouth 2 (two) times daily as needed for moderate pain.    ? ?No current facility-administered medications for this visit.  ? ? ?REVIEW OF SYSTEMS:  ? ?[X]  denotes positive finding, [ ]  denotes negative finding ?Cardiac  Comments:  ?Chest pain or chest pressure:    ?Shortness of breath upon exertion:    ?Short of breath when lying flat:    ?Irregular heart rhythm:    ?    ?Vascular    ?Pain in calf, thigh, or hip brought on by ambulation:    ?Pain in feet at night that wakes you up from your sleep:     ?Blood clot in your veins:    ?Leg swelling:     ?    ?Pulmonary    ?Oxygen at home:    ?Productive cough:     ?Wheezing:     ?    ?Neurologic    ?Sudden weakness in arms or legs:     ?Sudden numbness in arms or legs:     ?Sudden onset of difficulty speaking or slurred speech:    ?Temporary loss of vision in one eye:     ?Problems with dizziness:     ?    ?Gastrointestinal    ?Blood in stool:     ? ?Vomited blood:     ?    ?Genitourinary    ?Burning when urinating:     ?Blood in urine:    ?    ?Psychiatric    ?Major depression:     ?    ?Hematologic    ?Bleeding problems:    ?Problems with blood clotting too easily:    ?    ?Skin    ?Rashes or ulcers:    ?    ?Constitutional    ?Fever or chills:    ? ?PHYSICAL EXAM:  ? ?There were no vitals filed for this visit. ? ?GENERAL: The patient is a well-nourished female, in no acute distress. The vital signs are documented above. ?CARDIAC: There is a regular rate and rhythm.  ?VASCULAR: Palpable pedal pulses ?PULMONARY: Nonlabored respirations ?MUSCULOSKELETAL: There are no major deformities or cyanosis. ?NEUROLOGIC: No focal weakness or paresthesias are detected. ?SKIN: There are no ulcers or rashes noted. ?PSYCHIATRIC: The patient has a normal affect. ? ?STUDIES:  ? ?I have reviewed the CT angiogram of the neck with the following findings: ?1. Severe stenosis at the right common carotid bifurcation  due to ?bulky calcified plaque  with downstream underfilling/string sign. ?2. 70% stenosis at the left vertebral origin (which is from the ?arch). The left vertebral artery is non dominant. ?Common carotid stenosis was not calculable ? ?ASSESSMENT and PLAN  ? ?Asymptomatic right carotid stenosis: CT scan she has a string sign in the internal carotid artery and about a 70% stenosis at the carotid bifurcation.  There is also an area in the common carotid artery that I suspect is not visualized because of artifact from her neck surgery.  Because of the amount of plaque at her bifurcation, I think she is best treated with carotid endarterectomy.  I discussed the details of the operation including the risks and benefits of stroke, nerve injury, bleeding and cardiopulmonary complications.  All questions were answered.  She will be scheduled within the next few weeks for a right carotid endarterectomy. ? ? ?Durene CalWells Ryota Treece, IV, MD, FACS ?[facility] ?Tel [phone] ?Pager [phone]  ?

## 2021-08-07 NOTE — H&P (View-Only) (Signed)
? ?[facility] ? ?Patient name: [name] MRN: [id number] DOB: 07/24/1945 Sex: female ? ? ?REQUESTING PROVIDER:  ? ? Celeste Cantwell ? ? ?REASON FOR CONSULT:  ?  ?Carotid stenosis ? ?HISTORY OF PRESENT ILLNESS:  ? ?[name] is a 76 y.o. female, who is here today for evaluation of carotid stenosis.  She comes with a CT angiogram of the neck that shows a carotid bifurcation 70% stenosis on the right and nonopacification of the common carotid artery, suggestive of severe stenosis.  She is asymptomatic.  Specifically, she denies numbness and weakness in either extremity.  She denies slurred speech.  She denies amaurosis fugax. ? ?Patient has a history of peripheral vascular disease, status post bilateral superficial femoral artery angioplasty in 2012.  She is medically managed for hypertension.  She takes Repatha for hypercholesterolemia.  She is a former smoker. ? ?PAST MEDICAL HISTORY  ? ? ?Past Medical History:  ?Diagnosis Date  ? Arthritis   ? "all over" (07/25/2012)  ? Asthma   ? Carotid artery disease (HCC)   ? Environmental allergies   ? Fibromyalgia   ? GERD (gastroesophageal reflux disease)   ? Hyperlipidemia   ? Hypertension   ? Hypothyroidism   ? Peripheral vascular disease (HCC)   ? Post-menopausal   ? on HRT x 25 years  ? ? ? ?FAMILY HISTORY  ? ?Family History  ?Problem Relation Age of Onset  ? Heart failure Mother   ? Hypertension Father   ? Hypertension Sister   ? Hypertension Brother   ? Hypertension Brother   ? Heart disease Brother   ? ? ?SOCIAL HISTORY:  ? ?Social History  ? ?Socioeconomic History  ? Marital status: Married  ?  Spouse name: Not on file  ? Number of children: 1  ? Years of education: Not on file  ? Highest education level: Not on file  ?Occupational History  ? Not on file  ?Tobacco Use  ? Smoking status: Former  ?  Packs/day: 0.25  ?  Years: 30.00  ?  Pack years: 7.50  ?  Types: Cigarettes  ?  Quit date: 12/15/2020   ?  Years since quitting: 0.6  ? Smokeless tobacco: Never  ? Tobacco comments:  ?  (03/14/21)1-2 cigarettes a day. Some days she doesn't smoke at all.  ?Vaping Use  ? Vaping Use: Never used  ?Substance and Sexual Activity  ? Alcohol use: Not Currently  ? Drug use: No  ? Sexual activity: Yes  ?Other Topics Concern  ? Not on file  ?Social History Narrative  ? Not on file  ? ?Social Determinants of Health  ? ?Financial Resource Strain: Not on file  ?Food Insecurity: Not on file  ?Transportation Needs: Not on file  ?Physical Activity: Not on file  ?Stress: Not on file  ?Social Connections: Not on file  ?Intimate Partner Violence: Not on file  ? ? ?ALLERGIES:  ? ? ?Allergies  ?Allergen Reactions  ? Naloxegol Other (See Comments)  ?  Unknown reaction   ? Pine   ?  Seasonal allergy  ? Codeine Itching  ? ? ?CURRENT MEDICATIONS:  ? ? ?Current Outpatient Medications  ?Medication Sig Dispense Refill  ? amitriptyline (ELAVIL) 150 MG tablet Take 150 mg by mouth at bedtime.    ? amLODipine (NORVASC) 10 MG tablet Take 10 mg by mouth daily.    ? estrogens, conjugated, (PREMARIN) 0.625 MG tablet Take 0.625 mg by mouth daily. Take daily   for 21 days then do not take for 7 days.    ? Evolocumab (REPATHA SURECLICK) 140 MG/ML SOAJ Take 1 mL by mouth every 14 (fourteen) days.    ? Fluticasone-Salmeterol (ADVAIR) 100-50 MCG/DOSE AEPB Inhale 1 puff into the lungs 2 (two) times daily as needed (shortness of breath).    ? HYDROcodone-acetaminophen (NORCO/VICODIN) 5-325 MG tablet Take 1 tablet by mouth every 6 (six) hours as needed for moderate pain or severe pain. 20 tablet 0  ? levothyroxine (SYNTHROID) 100 MCG tablet Take 100 mcg by mouth daily before breakfast.    ? losartan (COZAAR) 50 MG tablet Take 50 mg by mouth daily.    ? Multiple Vitamin (MULTIVITAMIN WITH MINERALS) TABS tablet Take 1 tablet by mouth daily.    ? pantoprazole (PROTONIX) 40 MG tablet Take 40 mg by mouth daily.    ? pregabalin (LYRICA) 50 MG capsule Take 50 mg by mouth  daily as needed for pain.    ? traMADol (ULTRAM) 50 MG tablet Take 50 mg by mouth 2 (two) times daily as needed for moderate pain.    ? ?No current facility-administered medications for this visit.  ? ? ?REVIEW OF SYSTEMS:  ? ?[X] denotes positive finding, [ ] denotes negative finding ?Cardiac  Comments:  ?Chest pain or chest pressure:    ?Shortness of breath upon exertion:    ?Short of breath when lying flat:    ?Irregular heart rhythm:    ?    ?Vascular    ?Pain in calf, thigh, or hip brought on by ambulation:    ?Pain in feet at night that wakes you up from your sleep:     ?Blood clot in your veins:    ?Leg swelling:     ?    ?Pulmonary    ?Oxygen at home:    ?Productive cough:     ?Wheezing:     ?    ?Neurologic    ?Sudden weakness in arms or legs:     ?Sudden numbness in arms or legs:     ?Sudden onset of difficulty speaking or slurred speech:    ?Temporary loss of vision in one eye:     ?Problems with dizziness:     ?    ?Gastrointestinal    ?Blood in stool:     ? ?Vomited blood:     ?    ?Genitourinary    ?Burning when urinating:     ?Blood in urine:    ?    ?Psychiatric    ?Major depression:     ?    ?Hematologic    ?Bleeding problems:    ?Problems with blood clotting too easily:    ?    ?Skin    ?Rashes or ulcers:    ?    ?Constitutional    ?Fever or chills:    ? ?PHYSICAL EXAM:  ? ?There were no vitals filed for this visit. ? ?GENERAL: The patient is a well-nourished female, in no acute distress. The vital signs are documented above. ?CARDIAC: There is a regular rate and rhythm.  ?VASCULAR: Palpable pedal pulses ?PULMONARY: Nonlabored respirations ?MUSCULOSKELETAL: There are no major deformities or cyanosis. ?NEUROLOGIC: No focal weakness or paresthesias are detected. ?SKIN: There are no ulcers or rashes noted. ?PSYCHIATRIC: The patient has a normal affect. ? ?STUDIES:  ? ?I have reviewed the CT angiogram of the neck with the following findings: ?1. Severe stenosis at the right common carotid bifurcation  due to ?bulky calcified plaque   with downstream underfilling/string sign. ?2. 70% stenosis at the left vertebral origin (which is from the ?arch). The left vertebral artery is non dominant. ?Common carotid stenosis was not calculable ? ?ASSESSMENT and PLAN  ? ?Asymptomatic right carotid stenosis: CT scan she has a string sign in the internal carotid artery and about a 70% stenosis at the carotid bifurcation.  There is also an area in the common carotid artery that I suspect is not visualized because of artifact from her neck surgery.  Because of the amount of plaque at her bifurcation, I think she is best treated with carotid endarterectomy.  I discussed the details of the operation including the risks and benefits of stroke, nerve injury, bleeding and cardiopulmonary complications.  All questions were answered.  She will be scheduled within the next few weeks for a right carotid endarterectomy. ? ? ?Wells Bayler Nehring, IV, MD, FACS ?[facility] ?Tel [phone] ?Pager [phone]  ?

## 2021-08-15 ENCOUNTER — Ambulatory Visit (INDEPENDENT_AMBULATORY_CARE_PROVIDER_SITE_OTHER): Payer: Medicare HMO | Admitting: Orthopaedic Surgery

## 2021-08-15 ENCOUNTER — Encounter: Payer: Self-pay | Admitting: Orthopaedic Surgery

## 2021-08-15 DIAGNOSIS — G8929 Other chronic pain: Secondary | ICD-10-CM | POA: Diagnosis not present

## 2021-08-15 DIAGNOSIS — M25561 Pain in right knee: Secondary | ICD-10-CM | POA: Diagnosis not present

## 2021-08-15 MED ORDER — METHYLPREDNISOLONE ACETATE 40 MG/ML IJ SUSP
80.0000 mg | INTRAMUSCULAR | Status: AC | PRN
  Administered 2021-08-15: 80 mg via INTRA_ARTICULAR

## 2021-08-15 MED ORDER — BUPIVACAINE HCL 0.25 % IJ SOLN
2.0000 mL | INTRAMUSCULAR | Status: AC | PRN
  Administered 2021-08-15: 2 mL via INTRA_ARTICULAR

## 2021-08-15 MED ORDER — LIDOCAINE HCL 1 % IJ SOLN
3.0000 mL | INTRAMUSCULAR | Status: AC | PRN
  Administered 2021-08-15: 3 mL

## 2021-08-15 NOTE — Progress Notes (Signed)
? ?Office Visit Note ?  ?Patient: [name]           ?Date of Birth: 07-Mar-1946           ?MRN: [id number] ?Visit Date: 08/15/2021 ?             ?Requested by: Irena Reichmann, DO ?[address] ?[address] ?[place],  [place] [address] ?PCP: Irena Reichmann, DO ? ? ?Assessment & Plan: ?Visit Diagnoses: Right knee pain ? ?Plan: Pleasant 76 year old woman with a history of rheumatoid arthritis.  She was given an injection of cortisone in her knee in March.  She does not think it helped quite as much as she would like to.  She is inquiring about viscosupplementation.  She did have about 30 cc of serous blood-tinged fluid in her knee.  She was injected with 2 cc of Depo-Medrol today.  We will place a request for viscosupplementation. ?This patient is diagnosed with osteoarthritis of the knee(s).   ? ?Radiographs show evidence of joint space narrowing, osteophytes, subchondral sclerosis and/or subchondral cysts.  This patient has knee pain which interferes with functional and activities of daily living.   ? ?This patient has experienced inadequate response, adverse effects and/or intolerance with conservative treatments such as acetaminophen, NSAIDS, topical creams, physical therapy or regular exercise, knee bracing and/or weight loss.  ? ?This patient has experienced inadequate response or has a contraindication to intra articular steroid injections for at least 3 months.  ? ?This patient is not scheduled to have a total knee replacement within 6 months of starting treatment with viscosupplementation.  ? ?Follow-Up Instructions: No follow-ups on file.  ? ?Orders:  ?No orders of the defined types were placed in this encounter. ? ?No orders of the defined types were placed in this encounter. ? ? ? ? Procedures: ?Large Joint Inj: R knee on 08/15/2021 2:52 PM ?Indications: pain and diagnostic evaluation ?Details: 25 G 1.5 in needle, superior approach ? ?Arthrogram: No ? ?Medications: 80 mg methylPREDNISolone acetate 40 MG/ML;  3 mL lidocaine 1 %; 2 mL bupivacaine 0.25 % ?Aspirate: 30 mL blood-tinged ?Outcome: tolerated well, no immediate complications ?Procedure, treatment alternatives, risks and benefits explained, specific risks discussed. Consent was given by the patient.  ? ? ? ?Clinical Data: ?No additional findings. ? ? ?Subjective: ?Chief Complaint  ?Patient presents with  ? Right Knee - Pain  ?Patient presents today for right knee pain. She was last here in August of last year and received a cortisone injection. She states that it did help, but she had a "a lot going on at the time". She is taking prescription pain medicine, but did not give the names of the meds. ? ? ? ?Review of Systems  ?All other systems reviewed and are negative. ? ? ?Objective: ?Vital Signs: There were no vitals taken for this visit. ? ?Physical Exam ?Constitutional:   ?   Appearance: Normal appearance.  ?Pulmonary:  ?   Effort: Pulmonary effort is normal.  ?Skin: ?   General: Skin is warm.  ?Neurological:  ?   Mental Status: She is alert.  ? ? ?Ortho Exam ?Examination of her right knee she has no erythema or induration she does have a moderate effusion.  Pain over the medial lateral joint line.  Good stability.  Compartments are soft. ?Specialty Comments:  ?No specialty comments available. ? ?Imaging: ?No results found. ? ? ?PMFS History: ?Patient Active Problem List  ? Diagnosis Date Noted  ? Low back pain 12/28/2020  ?  Bilateral primary osteoarthritis of knee 10/29/2019  ? ?Past Medical History:  ?Diagnosis Date  ? Arthritis   ? "all over" (07/25/2012)  ? Asthma   ? Carotid artery disease (HCC)   ? Environmental allergies   ? Fibromyalgia   ? GERD (gastroesophageal reflux disease)   ? Hyperlipidemia   ? Hypertension   ? Hypothyroidism   ? Peripheral vascular disease (HCC)   ? Post-menopausal   ? on HRT x 25 years  ?  ?Family History  ?Problem Relation Age of Onset  ? Heart failure Mother   ? Hypertension Father   ? Hypertension Sister   ? Hypertension  Brother   ? Hypertension Brother   ? Heart disease Brother   ?  ?Past Surgical History:  ?Procedure Laterality Date  ? ABDOMINAL HYSTERECTOMY  ~ 1977  ? ANTERIOR CERVICAL DECOMPRESSION/DISCECTOMY FUSION 4 LEVELS N/A 07/23/2012  ? Procedure: ANTERIOR CERVICAL DECOMPRESSION/DISCECTOMY FUSION 3 LEVELS;  Surgeon: Emilee Hero, MD;  Location: [facility] OR;  Service: Orthopedics;  Laterality: N/A;  Anterior cervical decompression fusion, cervical 3-4, cervical 4-5, cervical 5-6 with instrumentation and allograft.  ? FEMORAL ARTERY STENT Bilateral 2011  ? LUMBAR LAMINECTOMY/DECOMPRESSION MICRODISCECTOMY N/A 03/22/2021  ? Procedure: LUMBAR 4 - LUMBAR 5 DECOMPRESSION;  Surgeon: Estill Bamberg, MD;  Location: [facility] OR;  Service: Orthopedics;  Laterality: N/A;  ? POSTERIOR CERVICAL FUSION/FORAMINOTOMY N/A 07/24/2012  ? Procedure: POSTERIOR CERVICAL FUSION/FORAMINOTOMY LEVEL 5;  Surgeon: Emilee Hero, MD;  Location: [facility] OR;  Service: Orthopedics;  Laterality: N/A;  Posterior cervical decompression, cervical 6-7, C7-T1, T1-T2. Cervical fusion cervical 3-4, cervical 4-5, cervical 5-6, cervical 6-7, cervical 7-T1 with allograft, local autograft.  ? POSTERIOR FUSION CERVICAL SPINE  07/24/2012  ? C3-T2 posterior fusion with instrumentation and allograft, C6-T2 decompression)   ? TONSILLECTOMY  1960's?  ? ?Social History  ? ?Occupational History  ? Not on file  ?Tobacco Use  ? Smoking status: Former  ?  Packs/day: 0.25  ?  Years: 30.00  ?  Pack years: 7.50  ?  Types: Cigarettes  ?  Quit date: 12/15/2020  ?  Years since quitting: 0.6  ? Smokeless tobacco: Never  ? Tobacco comments:  ?  (03/14/21)1-2 cigarettes a day. Some days she doesn't smoke at all.  ?Vaping Use  ? Vaping Use: Never used  ?Substance and Sexual Activity  ? Alcohol use: Not Currently  ? Drug use: No  ? Sexual activity: Yes  ? ? ? ? ? ? ?

## 2021-08-21 DIAGNOSIS — I10 Essential (primary) hypertension: Secondary | ICD-10-CM | POA: Diagnosis not present

## 2021-08-21 DIAGNOSIS — E559 Vitamin D deficiency, unspecified: Secondary | ICD-10-CM | POA: Diagnosis not present

## 2021-08-21 DIAGNOSIS — E039 Hypothyroidism, unspecified: Secondary | ICD-10-CM | POA: Diagnosis not present

## 2021-08-21 DIAGNOSIS — R739 Hyperglycemia, unspecified: Secondary | ICD-10-CM | POA: Diagnosis not present

## 2021-08-28 DIAGNOSIS — R7303 Prediabetes: Secondary | ICD-10-CM | POA: Diagnosis not present

## 2021-08-28 DIAGNOSIS — M1990 Unspecified osteoarthritis, unspecified site: Secondary | ICD-10-CM | POA: Diagnosis not present

## 2021-08-28 DIAGNOSIS — I6523 Occlusion and stenosis of bilateral carotid arteries: Secondary | ICD-10-CM | POA: Diagnosis not present

## 2021-08-28 DIAGNOSIS — I10 Essential (primary) hypertension: Secondary | ICD-10-CM | POA: Diagnosis not present

## 2021-08-28 DIAGNOSIS — E039 Hypothyroidism, unspecified: Secondary | ICD-10-CM | POA: Diagnosis not present

## 2021-08-28 DIAGNOSIS — E7800 Pure hypercholesterolemia, unspecified: Secondary | ICD-10-CM | POA: Diagnosis not present

## 2021-08-28 DIAGNOSIS — I739 Peripheral vascular disease, unspecified: Secondary | ICD-10-CM | POA: Diagnosis not present

## 2021-08-28 DIAGNOSIS — L4050 Arthropathic psoriasis, unspecified: Secondary | ICD-10-CM | POA: Diagnosis not present

## 2021-08-28 DIAGNOSIS — Z0000 Encounter for general adult medical examination without abnormal findings: Secondary | ICD-10-CM | POA: Diagnosis not present

## 2021-08-28 NOTE — Pre-Procedure Instructions (Signed)
Surgical Instructions ? ? ? Your procedure is scheduled on Friday, May 19. ? Report to [facility] Main Entrance "A" at 5:30 A.M., then check in with the Admitting office. ? Call this number if you have problems the morning of surgery: ? [phone] ? ? If you have any questions prior to your surgery date call [phone]: Open Monday-Friday 8am-4pm ? ? ? Remember: ? Do not eat or drink after midnight the night before your surgery ? ? ? Take these medicines the morning of surgery with A SIP OF WATER:  ?amLODipine (NORVASC)  ?atorvastatin (LIPITOR) ?levothyroxine (SYNTHROID)  ?pantoprazole (PROTONIX)  ?pregabalin (LYRICA) if needed ?Fluticasone-Salmeterol (ADVAIR)  if needed ?HYDROcodone-acetaminophen (NORCO/VICODIN) if needed or traMADol Janean Sark)  if needed ? ?Follow your surgeon's instructions on when to stop Clopidogrel (Plavix).  If no instructions were given by your surgeon then you will need to call the office to get those instructions.   ? ?As of today, STOP taking any Aspirin (unless otherwise instructed by your surgeon), meloxicam (MOBIC), Aleve, Naproxen, Ibuprofen, Motrin, Advil, Goody's, BC's, all herbal medications, fish oil, and all vitamins. ? ?         ?Do not wear jewelry or makeup ?Do not wear lotions, powders, perfumes/colognes, or deodorant. ?Do not shave 48 hours prior to surgery.  Men may shave face and neck. ?Do not bring valuables to the hospital. ?Do not wear nail polish, gel polish, artificial nails, or any other type of covering on natural nails (fingers and toes) ?If you have artificial nails or gel coating that need to be removed by a nail salon, please have this removed prior to surgery. Artificial nails or gel coating may interfere with anesthesia's ability to adequately monitor your vital signs. ? ?[facility] is not responsible for any belongings or valuables. .  ? ?Do NOT Smoke (Tobacco/Vaping)  24 hours prior to your procedure ? ?If you use a CPAP at night, you may bring your mask  for your overnight stay. ?  ?Contacts, glasses, hearing aids, dentures or partials may not be worn into surgery, please bring cases for these belongings ?  ?For patients admitted to the hospital, discharge time will be determined by your treatment team. ?  ?Patients discharged the day of surgery will not be allowed to drive home, and someone needs to stay with them for 24 hours. ? ? ?SURGICAL WAITING ROOM VISITATION ?Patients having surgery or a procedure in a hospital may have two support people. ?Children under the age of 11 must have an adult with them who is not the patient. ?They may stay in the waiting area during the procedure and may switch out with other visitors. If the patient needs to stay at the hospital during part of their recovery, the visitor guidelines for inpatient rooms apply. ? ?Please refer to the [facility] website for the visitor guidelines for Inpatients (after your surgery is over and you are in a regular room).  ? ? ? ? ? ?Special instructions:   ? ?Oral Hygiene is also important to reduce your risk of infection.  Remember - BRUSH YOUR TEETH THE MORNING OF SURGERY WITH YOUR REGULAR TOOTHPASTE ? ? ?[facility]- Preparing For Surgery ? ?Before surgery, you can play an important role. Because skin is not sterile, your skin needs to be as free of germs as possible. You can reduce the number of germs on your skin by washing with CHG (chlorahexidine gluconate) Soap before surgery.  CHG is an antiseptic cleaner which kills germs and  bonds with the skin to continue killing germs even after washing.   ? ? ?Please do not use if you have an allergy to CHG or antibacterial soaps. If your skin becomes reddened/irritated stop using the CHG.  ?Do not shave (including legs and underarms) for at least 48 hours prior to first CHG shower. It is OK to shave your face. ? ?Please follow these instructions carefully. ?  ? ? Shower the NIGHT BEFORE SURGERY and the MORNING OF SURGERY with CHG Soap.  ? If you chose  to wash your hair, wash your hair first as usual with your normal shampoo. After you shampoo, rinse your hair and body thoroughly to remove the shampoo.  Then [employer] and genitals (private parts) with your normal soap and rinse thoroughly to remove soap. ? ?After that Use CHG Soap as you would any other liquid soap. You can apply CHG directly to the skin and wash gently with a scrungie or a clean washcloth.  ? ?Apply the CHG Soap to your body ONLY FROM THE NECK DOWN.  Do not use on open wounds or open sores. Avoid contact with your eyes, ears, mouth and genitals (private parts). Wash Face and genitals (private parts)  with your normal soap.  ? ?Wash thoroughly, paying special attention to the area where your surgery will be performed. ? ?Thoroughly rinse your body with warm water from the neck down. ? ?DO NOT shower/wash with your normal soap after using and rinsing off the CHG Soap. ? ?Pat yourself dry with a CLEAN TOWEL. ? ?Wear CLEAN PAJAMAS to bed the night before surgery ? ?Place CLEAN SHEETS on your bed the night before your surgery ? ?DO NOT SLEEP WITH PETS. ? ? ?Day of Surgery: ? ?Take a shower with CHG soap. ?Wear Clean/Comfortable clothing the morning of surgery ?Do not apply any deodorants/lotions.   ?Remember to brush your teeth WITH YOUR REGULAR TOOTHPASTE. ? ? ? ?If you received a COVID test during your pre-op visit, it is requested that you wear a mask when out in public, stay away from anyone that may not be feeling well, and notify your surgeon if you develop symptoms. If you have been in contact with anyone that has tested positive in the last 10 days, please notify your surgeon. ? ?  ?Please read over the following fact sheets that you were given.  ? ?

## 2021-08-29 ENCOUNTER — Other Ambulatory Visit: Payer: Self-pay

## 2021-08-29 ENCOUNTER — Encounter (HOSPITAL_COMMUNITY): Payer: Self-pay

## 2021-08-29 ENCOUNTER — Encounter (HOSPITAL_COMMUNITY)
Admission: RE | Admit: 2021-08-29 | Discharge: 2021-08-29 | Disposition: A | Discharge status: Home / Self Care | Payer: Medicare HMO | Source: Ambulatory Visit | Attending: Surgery | Admitting: Surgery

## 2021-08-29 ENCOUNTER — Other Ambulatory Visit (HOSPITAL_COMMUNITY): Payer: Medicare HMO

## 2021-08-29 VITALS — BP 148/79 | HR 72 | Temp 98.0°F | Resp 17 | Ht 67.5 in | Wt 148.5 lb

## 2021-08-29 DIAGNOSIS — M797 Fibromyalgia: Secondary | ICD-10-CM | POA: Diagnosis present

## 2021-08-29 DIAGNOSIS — Z885 Allergy status to narcotic agent status: Secondary | ICD-10-CM | POA: Diagnosis not present

## 2021-08-29 DIAGNOSIS — Z01818 Encounter for other preprocedural examination: Secondary | ICD-10-CM

## 2021-08-29 DIAGNOSIS — I6521 Occlusion and stenosis of right carotid artery: Secondary | ICD-10-CM | POA: Diagnosis present

## 2021-08-29 DIAGNOSIS — Z79899 Other long term (current) drug therapy: Secondary | ICD-10-CM | POA: Diagnosis not present

## 2021-08-29 DIAGNOSIS — Z79890 Hormone replacement therapy (postmenopausal): Secondary | ICD-10-CM | POA: Diagnosis not present

## 2021-08-29 DIAGNOSIS — K219 Gastro-esophageal reflux disease without esophagitis: Secondary | ICD-10-CM | POA: Diagnosis present

## 2021-08-29 DIAGNOSIS — Z87891 Personal history of nicotine dependence: Secondary | ICD-10-CM | POA: Diagnosis not present

## 2021-08-29 DIAGNOSIS — I6523 Occlusion and stenosis of bilateral carotid arteries: Secondary | ICD-10-CM | POA: Insufficient documentation

## 2021-08-29 DIAGNOSIS — M1990 Unspecified osteoarthritis, unspecified site: Secondary | ICD-10-CM | POA: Diagnosis present

## 2021-08-29 DIAGNOSIS — I10 Essential (primary) hypertension: Secondary | ICD-10-CM | POA: Diagnosis present

## 2021-08-29 DIAGNOSIS — E7800 Pure hypercholesterolemia, unspecified: Secondary | ICD-10-CM | POA: Diagnosis present

## 2021-08-29 DIAGNOSIS — Z888 Allergy status to other drugs, medicaments and biological substances status: Secondary | ICD-10-CM | POA: Diagnosis not present

## 2021-08-29 DIAGNOSIS — Z8249 Family history of ischemic heart disease and other diseases of the circulatory system: Secondary | ICD-10-CM | POA: Diagnosis not present

## 2021-08-29 DIAGNOSIS — Z7951 Long term (current) use of inhaled steroids: Secondary | ICD-10-CM | POA: Diagnosis not present

## 2021-08-29 DIAGNOSIS — Z01812 Encounter for preprocedural laboratory examination: Secondary | ICD-10-CM | POA: Insufficient documentation

## 2021-08-29 DIAGNOSIS — I739 Peripheral vascular disease, unspecified: Secondary | ICD-10-CM | POA: Diagnosis present

## 2021-08-29 DIAGNOSIS — Z79891 Long term (current) use of opiate analgesic: Secondary | ICD-10-CM | POA: Diagnosis not present

## 2021-08-29 DIAGNOSIS — E039 Hypothyroidism, unspecified: Secondary | ICD-10-CM | POA: Diagnosis present

## 2021-08-29 LAB — URINALYSIS, ROUTINE W REFLEX MICROSCOPIC
Bilirubin Urine: NEGATIVE
Glucose, UA: NEGATIVE mg/dL
Hgb urine dipstick: NEGATIVE
Ketones, ur: NEGATIVE mg/dL
Leukocytes,Ua: NEGATIVE
Nitrite: NEGATIVE
Protein, ur: NEGATIVE mg/dL
Specific Gravity, Urine: 1.02 (ref 1.005–1.030)
pH: 6 (ref 5.0–8.0)

## 2021-08-29 LAB — CBC
HCT: 37.1 % (ref 36.0–46.0)
Hemoglobin: 12.4 g/dL (ref 12.0–15.0)
MCH: 30.7 pg (ref 26.0–34.0)
MCHC: 33.4 g/dL (ref 30.0–36.0)
MCV: 91.8 fL (ref 80.0–100.0)
Platelets: 287 10*3/uL (ref 150–400)
RBC: 4.04 MIL/uL (ref 3.87–5.11)
RDW: 14.4 % (ref 11.5–15.5)
WBC: 7.5 10*3/uL (ref 4.0–10.5)
nRBC: 0 % (ref 0.0–0.2)

## 2021-08-29 LAB — COMPREHENSIVE METABOLIC PANEL
ALT: 12 U/L (ref 0–44)
AST: 19 U/L (ref 15–41)
Albumin: 3.8 g/dL (ref 3.5–5.0)
Alkaline Phosphatase: 87 U/L (ref 38–126)
Anion gap: 7 (ref 5–15)
BUN: 16 mg/dL (ref 8–23)
CO2: 30 mmol/L (ref 22–32)
Calcium: 10.1 mg/dL (ref 8.9–10.3)
Chloride: 103 mmol/L (ref 98–111)
Creatinine, Ser: 0.66 mg/dL (ref 0.44–1.00)
GFR, Estimated: >60 mL/min (ref >60)
Glucose, Bld: 110 mg/dL — ABNORMAL HIGH (ref 70–99)
Potassium: 3.6 mmol/L (ref 3.5–5.1)
Sodium: 140 mmol/L (ref 135–145)
Total Bilirubin: 0.9 mg/dL (ref 0.3–1.2)
Total Protein: 7.4 g/dL (ref 6.5–8.1)

## 2021-08-29 LAB — PROTIME-INR
INR: 1 (ref 0.8–1.2)
Prothrombin Time: 12.5 seconds (ref 11.4–15.2)

## 2021-08-29 LAB — TYPE AND SCREEN
ABO/RH(D): O POS
Antibody Screen: NEGATIVE

## 2021-08-29 LAB — SURGICAL PCR SCREEN
MRSA, PCR: NEGATIVE
Staphylococcus aureus: NEGATIVE

## 2021-08-29 LAB — APTT: aPTT: 30 seconds (ref 24–36)

## 2021-08-29 NOTE — Progress Notes (Signed)
PCP - Janie Morning, DO ?Cardiologist - Dr. Adrian Prows ? ?PPM/ICD - n/a ?  ?Chest x-ray - n/a ?EKG - 06/20/21 ?Stress Test - 2010 ?ECHO - denies ?Cardiac Cath - denies ? ?Sleep Study - denies ?CPAP - denies ? ?Blood Thinner Instructions: Patient states she was instructed to continue taking Plavix per the surgeon's office.  ?Aspirin Instructions:n/a ? ?Patient states last Repatha injection was 7 days ago and she is not due for another injection until next week.  ?COVID TEST- n/a ? ? ?Anesthesia review: Yes ? ?Patient denies shortness of breath, fever, cough and chest pain at PAT appointment ? ? ?All instructions explained to the patient, with a verbal understanding of the material. Patient agrees to go over the instructions while at home for a better understanding. The opportunity to ask questions was provided. ? ? ?

## 2021-09-01 ENCOUNTER — Encounter (HOSPITAL_COMMUNITY)
Admission: RE | Disposition: A | Discharge status: Home / Self Care | Payer: Self-pay | Source: Home / Self Care | Attending: Surgery

## 2021-09-01 ENCOUNTER — Other Ambulatory Visit: Payer: Self-pay

## 2021-09-01 ENCOUNTER — Inpatient Hospital Stay (HOSPITAL_COMMUNITY): Payer: Medicare HMO | Admitting: Physician Assistant

## 2021-09-01 ENCOUNTER — Inpatient Hospital Stay (HOSPITAL_COMMUNITY)
Admission: RE | Admit: 2021-09-01 | Discharge: 2021-09-02 | DRG: 039 | Disposition: A | Discharge status: Home / Self Care | Payer: Medicare HMO | Attending: Surgery | Admitting: Surgery

## 2021-09-01 ENCOUNTER — Encounter (HOSPITAL_COMMUNITY): Payer: Self-pay | Admitting: Surgery

## 2021-09-01 DIAGNOSIS — M797 Fibromyalgia: Secondary | ICD-10-CM | POA: Diagnosis present

## 2021-09-01 DIAGNOSIS — M1990 Unspecified osteoarthritis, unspecified site: Secondary | ICD-10-CM | POA: Diagnosis present

## 2021-09-01 DIAGNOSIS — I10 Essential (primary) hypertension: Secondary | ICD-10-CM | POA: Diagnosis present

## 2021-09-01 DIAGNOSIS — E7800 Pure hypercholesterolemia, unspecified: Secondary | ICD-10-CM | POA: Diagnosis present

## 2021-09-01 DIAGNOSIS — I6529 Occlusion and stenosis of unspecified carotid artery: Secondary | ICD-10-CM | POA: Diagnosis present

## 2021-09-01 DIAGNOSIS — Z885 Allergy status to narcotic agent status: Secondary | ICD-10-CM | POA: Diagnosis not present

## 2021-09-01 DIAGNOSIS — I739 Peripheral vascular disease, unspecified: Secondary | ICD-10-CM | POA: Diagnosis present

## 2021-09-01 DIAGNOSIS — Z79891 Long term (current) use of opiate analgesic: Secondary | ICD-10-CM

## 2021-09-01 DIAGNOSIS — Z79890 Hormone replacement therapy (postmenopausal): Secondary | ICD-10-CM | POA: Diagnosis not present

## 2021-09-01 DIAGNOSIS — Z888 Allergy status to other drugs, medicaments and biological substances status: Secondary | ICD-10-CM

## 2021-09-01 DIAGNOSIS — Z87891 Personal history of nicotine dependence: Secondary | ICD-10-CM

## 2021-09-01 DIAGNOSIS — I6521 Occlusion and stenosis of right carotid artery: Secondary | ICD-10-CM | POA: Diagnosis present

## 2021-09-01 DIAGNOSIS — E039 Hypothyroidism, unspecified: Secondary | ICD-10-CM

## 2021-09-01 DIAGNOSIS — Z8249 Family history of ischemic heart disease and other diseases of the circulatory system: Secondary | ICD-10-CM

## 2021-09-01 DIAGNOSIS — K219 Gastro-esophageal reflux disease without esophagitis: Secondary | ICD-10-CM | POA: Diagnosis present

## 2021-09-01 DIAGNOSIS — Z79899 Other long term (current) drug therapy: Secondary | ICD-10-CM

## 2021-09-01 DIAGNOSIS — Z7951 Long term (current) use of inhaled steroids: Secondary | ICD-10-CM | POA: Diagnosis not present

## 2021-09-01 HISTORY — PX: ENDARTERECTOMY: SHX5162

## 2021-09-01 HISTORY — PX: PATCH ANGIOPLASTY: SHX6230

## 2021-09-01 LAB — POCT ACTIVATED CLOTTING TIME
Activated Clotting Time: 311 seconds
Activated Clotting Time: 245 seconds

## 2021-09-01 SURGERY — ENDARTERECTOMY, CAROTID
Anesthesia: General | Site: Neck | Laterality: Right

## 2021-09-01 MED ORDER — LACTATED RINGERS IV SOLN: INTRAVENOUS | Status: DC | PRN

## 2021-09-01 MED ORDER — LIDOCAINE 2% (20 MG/ML) 5 ML SYRINGE
INTRAMUSCULAR | Status: DC | PRN
Start: 2021-09-01 — End: 2021-09-01
  Administered 2021-09-01: 60 mg via INTRAVENOUS

## 2021-09-01 MED ORDER — ONDANSETRON HCL 4 MG/2ML IJ SOLN
INTRAMUSCULAR | Status: AC
  Filled 2021-09-01: qty 2

## 2021-09-01 MED ORDER — ROCURONIUM BROMIDE 10 MG/ML (PF) SYRINGE
PREFILLED_SYRINGE | INTRAVENOUS | Status: DC | PRN
  Administered 2021-09-01: 50 mg via INTRAVENOUS
  Administered 2021-09-01: 20 mg via INTRAVENOUS

## 2021-09-01 MED ORDER — TRAMADOL HCL 50 MG PO TABS
50.0000 mg | ORAL_TABLET | Freq: Two times a day (BID) | ORAL | Status: DC | PRN
  Administered 2021-09-01: 50 mg via ORAL
  Filled 2021-09-01: qty 1

## 2021-09-01 MED ORDER — HYDRALAZINE HCL 20 MG/ML IJ SOLN: 5.0000 mg | INTRAMUSCULAR | Status: DC | PRN

## 2021-09-01 MED ORDER — ACETAMINOPHEN 500 MG PO TABS
ORAL_TABLET | ORAL | Status: AC
Start: 2021-09-01 — End: 2021-09-01
  Administered 2021-09-01: 1000 mg via ORAL
  Filled 2021-09-01: qty 2

## 2021-09-01 MED ORDER — PREGABALIN 25 MG PO CAPS: 50.0000 mg | ORAL_CAPSULE | Freq: Every day | ORAL | Status: DC | PRN

## 2021-09-01 MED ORDER — CHLORHEXIDINE GLUCONATE CLOTH 2 % EX PADS: 6.0000 | MEDICATED_PAD | Freq: Once | CUTANEOUS | Status: DC

## 2021-09-01 MED ORDER — LIDOCAINE 2% (20 MG/ML) 5 ML SYRINGE
INTRAMUSCULAR | Status: AC
  Filled 2021-09-01: qty 5

## 2021-09-01 MED ORDER — 0.9 % SODIUM CHLORIDE (POUR BTL) OPTIME
TOPICAL | Status: DC | PRN
  Administered 2021-09-01: 1000 mL

## 2021-09-01 MED ORDER — HEPARIN 6000 UNIT IRRIGATION SOLUTION
Status: DC | PRN
  Administered 2021-09-01: 1

## 2021-09-01 MED ORDER — PANTOPRAZOLE SODIUM 40 MG PO TBEC
40.0000 mg | DELAYED_RELEASE_TABLET | Freq: Every day | ORAL | Status: DC
  Administered 2021-09-02: 40 mg via ORAL
  Filled 2021-09-01: qty 1

## 2021-09-01 MED ORDER — AMLODIPINE BESYLATE 10 MG PO TABS
10.0000 mg | ORAL_TABLET | Freq: Every day | ORAL | Status: DC
  Administered 2021-09-02: 10 mg via ORAL
  Filled 2021-09-01: qty 1

## 2021-09-01 MED ORDER — ADULT MULTIVITAMIN W/MINERALS CH
1.0000 | ORAL_TABLET | Freq: Every day | ORAL | Status: DC
  Administered 2021-09-01 – 2021-09-02 (×2): 1 via ORAL
  Filled 2021-09-01 (×2): qty 1

## 2021-09-01 MED ORDER — LABETALOL HCL 5 MG/ML IV SOLN
INTRAVENOUS | Status: DC | PRN
  Administered 2021-09-01 (×2): 2.5 mg via INTRAVENOUS

## 2021-09-01 MED ORDER — VITAMIN D 25 MCG (1000 UNIT) PO TABS
1000.0000 [IU] | ORAL_TABLET | Freq: Every day | ORAL | Status: DC
  Administered 2021-09-01 – 2021-09-02 (×2): 1000 [IU] via ORAL
  Filled 2021-09-01 (×2): qty 1

## 2021-09-01 MED ORDER — FOLIC ACID 1 MG PO TABS
1.0000 mg | ORAL_TABLET | Freq: Every day | ORAL | Status: DC
Start: 2021-09-01 — End: 2021-09-02
  Administered 2021-09-01 – 2021-09-02 (×2): 1 mg via ORAL
  Filled 2021-09-01 (×2): qty 1

## 2021-09-01 MED ORDER — DOCUSATE SODIUM 100 MG PO CAPS
100.0000 mg | ORAL_CAPSULE | Freq: Every day | ORAL | Status: DC
  Administered 2021-09-02: 100 mg via ORAL
  Filled 2021-09-01: qty 1

## 2021-09-01 MED ORDER — ONDANSETRON HCL 4 MG/2ML IJ SOLN
INTRAMUSCULAR | Status: DC | PRN
  Administered 2021-09-01: 4 mg via INTRAVENOUS

## 2021-09-01 MED ORDER — PHENYLEPHRINE HCL-NACL 20-0.9 MG/250ML-% IV SOLN
INTRAVENOUS | Status: DC | PRN
  Administered 2021-09-01: 50 ug/min via INTRAVENOUS

## 2021-09-01 MED ORDER — POLYETHYLENE GLYCOL 3350 17 G PO PACK: 17.0000 g | PACK | Freq: Every day | ORAL | Status: DC | PRN

## 2021-09-01 MED ORDER — METOPROLOL TARTRATE 5 MG/5ML IV SOLN: 2.0000 mg | INTRAVENOUS | Status: DC | PRN

## 2021-09-01 MED ORDER — PHENYLEPHRINE 80 MCG/ML (10ML) SYRINGE FOR IV PUSH (FOR BLOOD PRESSURE SUPPORT)
PREFILLED_SYRINGE | INTRAVENOUS | Status: AC
  Filled 2021-09-01: qty 10

## 2021-09-01 MED ORDER — SODIUM CHLORIDE 0.9 % IV SOLN
INTRAVENOUS | Status: DC
Start: 2021-09-01 — End: 2021-09-01

## 2021-09-01 MED ORDER — CEFAZOLIN SODIUM-DEXTROSE 2-4 GM/100ML-% IV SOLN
2.0000 g | INTRAVENOUS | Status: AC
  Administered 2021-09-01: 2 g via INTRAVENOUS

## 2021-09-01 MED ORDER — BISACODYL 10 MG RE SUPP: 10.0000 mg | Freq: Every day | RECTAL | Status: DC | PRN

## 2021-09-01 MED ORDER — ORAL CARE MOUTH RINSE: 15.0000 mL | Freq: Once | OROMUCOSAL | Status: AC

## 2021-09-01 MED ORDER — ATORVASTATIN CALCIUM 10 MG PO TABS
20.0000 mg | ORAL_TABLET | Freq: Every day | ORAL | Status: DC
  Administered 2021-09-02: 20 mg via ORAL
  Filled 2021-09-01: qty 2

## 2021-09-01 MED ORDER — CLOPIDOGREL BISULFATE 75 MG PO TABS
75.0000 mg | ORAL_TABLET | Freq: Every day | ORAL | Status: DC
  Administered 2021-09-02: 75 mg via ORAL
  Filled 2021-09-01: qty 1

## 2021-09-01 MED ORDER — GLYCOPYRROLATE PF 0.2 MG/ML IJ SOSY
PREFILLED_SYRINGE | INTRAMUSCULAR | Status: AC
  Filled 2021-09-01: qty 2

## 2021-09-01 MED ORDER — PROTAMINE SULFATE 10 MG/ML IV SOLN
INTRAVENOUS | Status: AC
  Filled 2021-09-01: qty 15

## 2021-09-01 MED ORDER — SODIUM CHLORIDE 0.9 % IV SOLN: 500.0000 mL | Freq: Once | INTRAVENOUS | Status: DC | PRN

## 2021-09-01 MED ORDER — FENTANYL CITRATE (PF) 250 MCG/5ML IJ SOLN
INTRAMUSCULAR | Status: AC
Start: 2021-09-01 — End: ?
  Filled 2021-09-01: qty 5

## 2021-09-01 MED ORDER — GLYCOPYRROLATE PF 0.2 MG/ML IJ SOSY
PREFILLED_SYRINGE | INTRAMUSCULAR | Status: DC | PRN
  Administered 2021-09-01 (×2): .2 mg via INTRAVENOUS

## 2021-09-01 MED ORDER — PROTAMINE SULFATE 10 MG/ML IV SOLN
INTRAVENOUS | Status: DC | PRN
  Administered 2021-09-01: 50 mg via INTRAVENOUS

## 2021-09-01 MED ORDER — LOSARTAN POTASSIUM 50 MG PO TABS
50.0000 mg | ORAL_TABLET | Freq: Every day | ORAL | Status: DC
  Administered 2021-09-01 – 2021-09-02 (×2): 50 mg via ORAL
  Filled 2021-09-01 (×2): qty 1

## 2021-09-01 MED ORDER — LEVOTHYROXINE SODIUM 100 MCG PO TABS
100.0000 ug | ORAL_TABLET | Freq: Every day | ORAL | Status: DC
  Administered 2021-09-02: 100 ug via ORAL
  Filled 2021-09-01: qty 1

## 2021-09-01 MED ORDER — DEXAMETHASONE SODIUM PHOSPHATE 10 MG/ML IJ SOLN
INTRAMUSCULAR | Status: AC
  Filled 2021-09-01: qty 1

## 2021-09-01 MED ORDER — SODIUM CHLORIDE 0.9 % IV SOLN: INTRAVENOUS | Status: DC

## 2021-09-01 MED ORDER — FENTANYL CITRATE (PF) 250 MCG/5ML IJ SOLN
INTRAMUSCULAR | Status: DC | PRN
  Administered 2021-09-01: 100 ug via INTRAVENOUS
  Administered 2021-09-01: 20 ug via INTRAVENOUS
  Administered 2021-09-01: 50 ug via INTRAVENOUS

## 2021-09-01 MED ORDER — ACETAMINOPHEN 325 MG PO TABS: 325.0000 mg | ORAL_TABLET | ORAL | Status: DC | PRN

## 2021-09-01 MED ORDER — LABETALOL HCL 5 MG/ML IV SOLN: 10.0000 mg | INTRAVENOUS | Status: DC | PRN

## 2021-09-01 MED ORDER — ACETAMINOPHEN 650 MG RE SUPP: 325.0000 mg | RECTAL | Status: DC | PRN

## 2021-09-01 MED ORDER — FENTANYL CITRATE (PF) 100 MCG/2ML IJ SOLN
25.0000 ug | INTRAMUSCULAR | Status: DC | PRN
  Administered 2021-09-01: 25 ug via INTRAVENOUS

## 2021-09-01 MED ORDER — HYDROCODONE-ACETAMINOPHEN 5-325 MG PO TABS
1.0000 | ORAL_TABLET | Freq: Four times a day (QID) | ORAL | Status: DC | PRN
  Administered 2021-09-01: 1 via ORAL
  Filled 2021-09-01: qty 1

## 2021-09-01 MED ORDER — CEFAZOLIN SODIUM-DEXTROSE 2-4 GM/100ML-% IV SOLN
2.0000 g | Freq: Three times a day (TID) | INTRAVENOUS | Status: AC
  Administered 2021-09-01 – 2021-09-02 (×2): 2 g via INTRAVENOUS
  Filled 2021-09-01 (×2): qty 100

## 2021-09-01 MED ORDER — MORPHINE SULFATE (PF) 2 MG/ML IV SOLN
2.0000 mg | INTRAVENOUS | Status: DC | PRN
  Administered 2021-09-01 – 2021-09-02 (×2): 2 mg via INTRAVENOUS
  Filled 2021-09-01 (×2): qty 1

## 2021-09-01 MED ORDER — AMISULPRIDE (ANTIEMETIC) 5 MG/2ML IV SOLN: 10.0000 mg | Freq: Once | INTRAVENOUS | Status: DC | PRN

## 2021-09-01 MED ORDER — MOMETASONE FURO-FORMOTEROL FUM 100-5 MCG/ACT IN AERO
2.0000 | INHALATION_SPRAY | Freq: Two times a day (BID) | RESPIRATORY_TRACT | Status: DC
Start: 2021-09-01 — End: 2021-09-02
  Administered 2021-09-01 – 2021-09-02 (×2): 2 via RESPIRATORY_TRACT
  Filled 2021-09-01: qty 8.8

## 2021-09-01 MED ORDER — ALUM & MAG HYDROXIDE-SIMETH 200-200-20 MG/5ML PO SUSP: 15.0000 mL | ORAL | Status: DC | PRN

## 2021-09-01 MED ORDER — HEPARIN SODIUM (PORCINE) 1000 UNIT/ML IJ SOLN
INTRAMUSCULAR | Status: AC
  Filled 2021-09-01: qty 10

## 2021-09-01 MED ORDER — ARTIFICIAL TEARS OPHTHALMIC OINT
TOPICAL_OINTMENT | OPHTHALMIC | Status: AC
  Filled 2021-09-01: qty 3.5

## 2021-09-01 MED ORDER — MELOXICAM 7.5 MG PO TABS
15.0000 mg | ORAL_TABLET | Freq: Every day | ORAL | Status: DC
  Administered 2021-09-01 – 2021-09-02 (×2): 15 mg via ORAL
  Filled 2021-09-01 (×2): qty 2

## 2021-09-01 MED ORDER — FENTANYL CITRATE (PF) 100 MCG/2ML IJ SOLN
INTRAMUSCULAR | Status: AC
  Filled 2021-09-01: qty 2

## 2021-09-01 MED ORDER — HEMOSTATIC AGENTS (NO CHARGE) OPTIME
TOPICAL | Status: DC | PRN
  Administered 2021-09-01: 1 via TOPICAL

## 2021-09-01 MED ORDER — HEPARIN SODIUM (PORCINE) 1000 UNIT/ML IJ SOLN
INTRAMUSCULAR | Status: DC | PRN
  Administered 2021-09-01: 7000 [IU] via INTRAVENOUS

## 2021-09-01 MED ORDER — ONDANSETRON HCL 4 MG/2ML IJ SOLN: 4.0000 mg | Freq: Four times a day (QID) | INTRAMUSCULAR | Status: DC | PRN

## 2021-09-01 MED ORDER — GUAIFENESIN-DM 100-10 MG/5ML PO SYRP: 15.0000 mL | ORAL_SOLUTION | ORAL | Status: DC | PRN

## 2021-09-01 MED ORDER — MAGNESIUM SULFATE 2 GM/50ML IV SOLN: 2.0000 g | Freq: Every day | INTRAVENOUS | Status: DC | PRN

## 2021-09-01 MED ORDER — DEXAMETHASONE SODIUM PHOSPHATE 10 MG/ML IJ SOLN
INTRAMUSCULAR | Status: DC | PRN
  Administered 2021-09-01: 10 mg via INTRAVENOUS

## 2021-09-01 MED ORDER — PHENOL 1.4 % MT LIQD: 1.0000 | OROMUCOSAL | Status: DC | PRN

## 2021-09-01 MED ORDER — CHLORHEXIDINE GLUCONATE 0.12 % MT SOLN
15.0000 mL | Freq: Once | OROMUCOSAL | Status: AC
  Administered 2021-09-01: 15 mL via OROMUCOSAL

## 2021-09-01 MED ORDER — SUGAMMADEX SODIUM 200 MG/2ML IV SOLN
INTRAVENOUS | Status: DC | PRN
  Administered 2021-09-01: 134.2 mg via INTRAVENOUS

## 2021-09-01 MED ORDER — POTASSIUM CHLORIDE CRYS ER 20 MEQ PO TBCR: 20.0000 meq | EXTENDED_RELEASE_TABLET | Freq: Every day | ORAL | Status: DC | PRN

## 2021-09-01 MED ORDER — AMITRIPTYLINE HCL 50 MG PO TABS
150.0000 mg | ORAL_TABLET | Freq: Every day | ORAL | Status: DC
  Administered 2021-09-01: 150 mg via ORAL
  Filled 2021-09-01: qty 3

## 2021-09-01 MED ORDER — ACETAMINOPHEN 500 MG PO TABS: 1000.0000 mg | ORAL_TABLET | Freq: Once | ORAL | Status: AC

## 2021-09-01 MED ORDER — PROPOFOL 10 MG/ML IV BOLUS
INTRAVENOUS | Status: DC | PRN
Start: 2021-09-01 — End: 2021-09-01
  Administered 2021-09-01: 100 mg via INTRAVENOUS
  Administered 2021-09-01: 70 mg via INTRAVENOUS

## 2021-09-01 MED ORDER — PROPOFOL 500 MG/50ML IV EMUL
INTRAVENOUS | Status: DC | PRN
  Administered 2021-09-01: 40 ug/kg/min via INTRAVENOUS

## 2021-09-01 MED ORDER — ROCURONIUM BROMIDE 10 MG/ML (PF) SYRINGE
PREFILLED_SYRINGE | INTRAVENOUS | Status: AC
  Filled 2021-09-01: qty 10

## 2021-09-01 SURGICAL SUPPLY — 51 items
ADH SKN CLS APL DERMABOND .7 (GAUZE/BANDAGES/DRESSINGS) ×1
AGENT HMST KT MTR STRL THRMB (HEMOSTASIS) ×1
BAG COUNTER SPONGE SURGICOUNT (BAG) ×2 IMPLANT
BAG SPNG CNTER NS LX DISP (BAG) ×1
CANISTER SUCT 3000ML PPV (MISCELLANEOUS) ×2 IMPLANT
CATH ROBINSON RED A/P 18FR (CATHETERS) ×2 IMPLANT
CATH SUCT 10FR WHISTLE TIP (CATHETERS) ×2 IMPLANT
CLIP VESOCCLUDE MED 6/CT (CLIP) ×2 IMPLANT
CLIP VESOCCLUDE SM WIDE 6/CT (CLIP) ×3 IMPLANT
COVER PROBE W GEL 5X96 (DRAPES) ×1 IMPLANT
DERMABOND ADVANCED (GAUZE/BANDAGES/DRESSINGS) ×1
DERMABOND ADVANCED .7 DNX12 (GAUZE/BANDAGES/DRESSINGS) ×1 IMPLANT
DRAIN CHANNEL 15F RND FF W/TCR (WOUND CARE) IMPLANT
DRAPE ORTHO SPLIT 77X108 STRL (DRAPES) ×2
DRAPE SURG ORHT 6 SPLT 77X108 (DRAPES) IMPLANT
ELECT REM PT RETURN 9FT ADLT (ELECTROSURGICAL) ×2
ELECTRODE REM PT RTRN 9FT ADLT (ELECTROSURGICAL) ×1 IMPLANT
EVACUATOR SILICONE 100CC (DRAIN) IMPLANT
GLOVE SURG SS PI 7.5 STRL IVOR (GLOVE) ×6 IMPLANT
GOWN STRL REUS W/ TWL LRG LVL3 (GOWN DISPOSABLE) ×2 IMPLANT
GOWN STRL REUS W/ TWL XL LVL3 (GOWN DISPOSABLE) ×1 IMPLANT
GOWN STRL REUS W/TWL LRG LVL3 (GOWN DISPOSABLE) ×4
GOWN STRL REUS W/TWL XL LVL3 (GOWN DISPOSABLE) ×2
HEMOSTAT SNOW SURGICEL 2X4 (HEMOSTASIS) IMPLANT
INSERT FOGARTY SM (MISCELLANEOUS) IMPLANT
KIT BASIN OR (CUSTOM PROCEDURE TRAY) ×2 IMPLANT
KIT SHUNT ARGYLE CAROTID ART 6 (VASCULAR PRODUCTS) IMPLANT
KIT TURNOVER KIT B (KITS) ×2 IMPLANT
NDL HYPO 25GX1X1/2 BEV (NEEDLE) IMPLANT
NEEDLE HYPO 25GX1X1/2 BEV (NEEDLE) IMPLANT
NS IRRIG 1000ML POUR BTL (IV SOLUTION) ×6 IMPLANT
PACK CAROTID (CUSTOM PROCEDURE TRAY) ×2 IMPLANT
PAD ARMBOARD 7.5X6 YLW CONV (MISCELLANEOUS) ×4 IMPLANT
PATCH VASC XENOSURE 1CMX6CM (Vascular Products) ×2 IMPLANT
PATCH VASC XENOSURE 1X6 (Vascular Products) IMPLANT
POSITIONER HEAD DONUT 9IN (MISCELLANEOUS) ×2 IMPLANT
SET WALTER ACTIVATION W/DRAPE (SET/KITS/TRAYS/PACK) IMPLANT
SHEATH PROBE COVER 6X72 (BAG) ×1 IMPLANT
SHUNT CAROTID BYPASS 10 (VASCULAR PRODUCTS) IMPLANT
SHUNT CAROTID BYPASS 12FRX15.5 (VASCULAR PRODUCTS) IMPLANT
SURGIFLO W/THROMBIN 8M KIT (HEMOSTASIS) ×1 IMPLANT
SUT ETHILON 3 0 PS 1 (SUTURE) IMPLANT
SUT PROLENE 6 0 BV (SUTURE) ×5 IMPLANT
SUT SILK 3 0 (SUTURE)
SUT SILK 3-0 18XBRD TIE 12 (SUTURE) IMPLANT
SUT VIC AB 3-0 SH 27 (SUTURE) ×4
SUT VIC AB 3-0 SH 27X BRD (SUTURE) ×2 IMPLANT
SUT VIC AB 3-0 X1 27 (SUTURE) ×2 IMPLANT
SYR CONTROL 10ML LL (SYRINGE) IMPLANT
TOWEL GREEN STERILE (TOWEL DISPOSABLE) ×2 IMPLANT
WATER STERILE IRR 1000ML POUR (IV SOLUTION) ×2 IMPLANT

## 2021-09-01 NOTE — Anesthesia Procedure Notes (Signed)
Procedure Name: Intubation Date/Time: 09/01/2021 8:05 AM Performed by: Maude Leriche, CRNA Pre-anesthesia Checklist: Patient identified, Emergency Drugs available, Suction available and Patient being monitored Patient Re-evaluated:Patient Re-evaluated prior to induction Oxygen Delivery Method: Circle system utilized Preoxygenation: Pre-oxygenation with 100% oxygen Induction Type: IV induction Ventilation: Mask ventilation without difficulty Laryngoscope Size: Miller and 2 Grade View: Grade I Tube type: Oral Tube size: 7.0 mm Number of attempts: 1 Placement Confirmation: ETT inserted through vocal cords under direct vision, positive ETCO2 and breath sounds checked- equal and bilateral Secured at: 21 cm Tube secured with: Tape Dental Injury: Teeth and Oropharynx as per pre-operative assessment

## 2021-09-01 NOTE — Op Note (Signed)
Patient name: [name] MRN: [id number] DOB: 03-27-1946 Sex: female  09/01/2021 Pre-operative Diagnosis: Asymptomatic   right carotid stenosis Post-operative diagnosis:  Same Surgeon:  Durene Cal Assistants:  Doreatha Massed, PA Procedure:    right carotid Endarterectomy with bovine pericardial patch angioplasty Anesthesia:  General Blood Loss:  minimal Specimens:  none  Findings:  90 %stenosis; Thrombus:  none  Indications:  This is a 76 year old female with asymptomatic right carotid stenosis, confirmed by CT.  She is here for endarterectomy  Procedure:  The patient was identified in the holding area and taken to [facility] OR ROOM 16  The patient was then placed supine on the table.   General endotrachial anesthesia was administered.  The patient was prepped and draped in the usual sterile fashion.  A time out was called and antibiotics were administered.  A PA was necessary to expedite the procedure.  The incision was made along the anterior border of the right sternocleidomastoid muscle.  Cautery was used to dissect through the subcutaneous tissue.  The platysma muscle was divided with cautery.  The internal jugular vein was exposed along its anterior medial border.  The common facial vein was exposed and then divided between 2-0 silk ties and metal clips.  The common carotid artery was then circumferentially exposed and encircled with an umbilical tape.  The vagus nerve was identified and protected.  Next sharp dissection was used to expose the external carotid artery and the superior thyroid artery.  The were encircled with a blue vessel loop and a 2-0 silk tie respectively.  Finally, the internal carotid was carefully dissected free.  An umbilical tape was placed around the internal carotid artery distal to the diseased segment.  The hypoglossal nerve was visualized throughout and protected.  The patient was given systemic heparinization.  A bovine carotid patch was selected and prepared  on the back table.  A 10 french shunt was also prepared.  After blood pressure readings were appropriate and the heparin had been given time to circulate, the internal carotid artery was occluded with a baby Gregory clamp.  The external and common carotid arteries were then occluded with vascular clamps and the 2-0 tie tightened on the superior thyroid artery.  A #11 blade was used to make an arteriotomy in the common carotid artery.  This was extended with Potts scissors along the anterior and lateral border of the common and internal carotid artery.  Approximately 90% stenosis was identified.  There was no thrombus identified.  The 10 french shunt was not  placed, as there was excellent back bleeding.  A kleiner kuntz elevator was used to perform endarterectomy.  An eversion endarterectomy was performed in the external carotid artery.  A good distal endpoint was obtained in the internal carotid artery.  The specimen was removed and sent to pathology.  Heparinized saline was used to irrigate the endarterectomized field.  All potential embolic debris was removed.  Bovine pericardial patch angioplasty was then performed using a running 6-0 Prolene. The common internal and external carotid arteries were all appropriately flushed. The artery was again irrigated with heparin saline.  The anastomosis was then secured. The clamp was first released on the external carotid artery followed by the common carotid artery approximately 30 seconds later, bloodflow was reestablish through the internal carotid artery.  Next, a hand-held  Doppler was used to evaluate the signals in the common, external, and internal  carotid arteries, all of which had appropriate signals. I  then administered  50 mg protamine. The wound was then irrigated.  After hemostasis was achieved, the carotid sheath was reapproximated with 3-0 Vicryl. The  platysma muscle was reapproximated with running 3-0 Vicryl. The skin  was closed with 4-0 Vicryl.  Dermabond was placed on the skin. The  patient was then successfully extubated. er neurologic exam was  similar to his preprocedural exam. The patient was then taken to recovery room  in stable condition. There were no complications.     Disposition:  To PACU in stable condition.  Relevant Operative Details:  very calcified carotid bifurcation with 90% stenosis.  No shunt was used as there was pulsatile back bleeding.  Difficult exposure due to neck fusion.  Bovine patch used  V. Durene Cal, M.D., [facility] [place] Office: [phone] Pager:  [phone]

## 2021-09-01 NOTE — Anesthesia Procedure Notes (Signed)
Arterial Line Insertion Start/End5/19/2023 7:15 AM, 09/01/2021 7:20 AM Performed by: Marcene Duos, MD, Cy Blamer, CRNA, CRNA  Patient location: Pre-op. Preanesthetic checklist: patient identified, IV checked, site marked, risks and benefits discussed, surgical consent, monitors and equipment checked, pre-op evaluation, timeout performed and anesthesia consent Lidocaine 1% used for infiltration Left, radial was placed Catheter size: 20 G Hand hygiene performed  and maximum sterile barriers used  Allen's test indicative of satisfactory collateral circulation Attempts: 1 Procedure performed without using ultrasound guided technique. Following insertion, Biopatch and dressing applied. Post procedure assessment: unchanged  Patient tolerated the procedure well with no immediate complications.

## 2021-09-01 NOTE — Interval H&P Note (Signed)
History and Physical Interval Note:  09/01/2021 7:47 AM  [name]  has presented today for surgery, with the diagnosis of Bilateral carotid stenosis.  The various methods of treatment have been discussed with the patient and family. After consideration of risks, benefits and other options for treatment, the patient has consented to  Procedure(s): RIGHT CAROTID ENDARTERECTOMY (Right) as a surgical intervention.  The patient's history has been reviewed, patient examined, no change in status, stable for surgery.  I have reviewed the patient's chart and labs.  Questions were answered to the patient's satisfaction.     Durene Cal

## 2021-09-01 NOTE — Transfer of Care (Signed)
Immediate Anesthesia Transfer of Care Note  Patient: [name]  Procedure(s) Performed: RIGHT CAROTID ENDARTERECTOMY (Right: Neck) PATCH ANGIOPLASTY OF RIGHT CAROTID ARTERY USING XENOSURE BOVINE PERICARDIUM PATCH (Right: Neck)  Patient Location: PACU  Anesthesia Type:General  Level of Consciousness: awake, alert  and oriented  Airway & Oxygen Therapy: Patient Spontanous Breathing and Patient connected to face mask oxygen  Post-op Assessment: Report given to RN, Post -op Vital signs reviewed and stable, Patient moving all extremities X 4 and Patient able to stick tongue midline  Post vital signs: Reviewed  Last Vitals:  Vitals Value Taken Time  BP 139/60 09/01/21 1017  Temp 97.6   Pulse 60 09/01/21 1019  Resp 12 09/01/21 1019  SpO2 96 % 09/01/21 1019  Vitals shown include unvalidated device data.  Last Pain:  Vitals:   09/01/21 0611  TempSrc:   PainSc: 0-No pain         Complications: No notable events documented.

## 2021-09-01 NOTE — Discharge Instructions (Signed)
   [facility]   Discharge Instructions   Carotid Surgery  Please refer to the following instructions for your post-procedure care. Your surgeon or physician assistant will discuss any changes with you.  Activity  You are encouraged to walk as much as you can. You can slowly return to normal activities but must avoid strenuous activity and heavy lifting until your doctor tell you it's okay. Avoid activities such as vacuuming or swinging a golf club. You can drive after one week if you are comfortable and you are no longer taking prescription pain medications. It is normal to feel tired for serval weeks after your surgery. It is also normal to have difficulty with sleep habits, eating, and bowel movements after surgery. These will go away with time.  Bathing/Showering  Shower daily after you go home. Do not soak in a bathtub, hot tub, or swim until the incision heals completely.  Incision Care  Shower every day. Clean your incision with mild soap and water. Pat the area dry with a clean towel. You do not need a bandage unless otherwise instructed. Do not apply any ointments or creams to your incision. You may have skin glue on your incision. Do not peel it off. It will come off on its own in about one week. Your incision may feel thickened and raised for several weeks after your surgery. This is normal and the skin will soften over time.   For Men Only: It's okay to shave around the incision but do not shave the incision itself for 2 weeks. It is common to have numbness under your chin that could last for several months.  Diet  Resume your normal diet. There are no special food restrictions following this procedure. A low fat/low cholesterol diet is recommended for all patients with vascular disease. In order to heal from your surgery, it is CRITICAL to get adequate nutrition. Your body requires vitamins, minerals, and protein. Vegetables are the best source of  vitamins and minerals. Vegetables also provide the perfect balance of protein. Processed food has little nutritional value, so try to avoid this.  Medications  Resume taking all of your medications unless your doctor or physician assistant tells you not to. If your incision is causing pain, you may take over-the- counter pain relievers such as acetaminophen (Tylenol). If you were prescribed a stronger pain medication, please be aware these medications can cause nausea and constipation. Prevent nausea by taking the medication with a snack or meal. Avoid constipation by drinking plenty of fluids and eating foods with a high amount of fiber, such as fruits, vegetables, and grains.   Do not take Tylenol if you are taking prescription pain medications.  Follow Up  Our office will schedule a follow up appointment 2-3 weeks following discharge.  Please call us immediately for any of the following conditions  . Increased pain, redness, drainage (pus) from your incision site. . Fever of 101 degrees or higher. . If you should develop stroke (slurred speech, difficulty swallowing, weakness on one side of your body, loss of vision) you should call 911 and go to the nearest emergency room. .  Reduce your risk of vascular disease:  . Stop smoking. If you would like help call [facility] at 1-800-QUIT-NOW (1-800-784-8669) or [facility] at [phone]. . Manage your cholesterol . Maintain a desired weight . Control your diabetes . Keep your blood pressure down .  If you have any questions, please call the office at [phone]. 

## 2021-09-01 NOTE — Anesthesia Postprocedure Evaluation (Signed)
Anesthesia Post Note  Patient: [name]  Procedure(s) Performed: RIGHT CAROTID ENDARTERECTOMY (Right: Neck) PATCH ANGIOPLASTY OF RIGHT CAROTID ARTERY USING XENOSURE BOVINE PERICARDIUM PATCH (Right: Neck)     Patient location during evaluation: PACU Anesthesia Type: General Level of consciousness: awake and alert Pain management: pain level controlled Vital Signs Assessment: post-procedure vital signs reviewed and stable Respiratory status: spontaneous breathing, nonlabored ventilation, respiratory function stable and patient connected to nasal cannula oxygen Cardiovascular status: blood pressure returned to baseline and stable Postop Assessment: no apparent nausea or vomiting Anesthetic complications: no   No notable events documented.  Last Vitals:  Vitals:   09/01/21 1350 09/01/21 1400  BP: 124/69 118/66  Pulse: 64 (!) 59  Resp: 16 14  Temp: 36.7 C   SpO2: 95% 99%    Last Pain:  Vitals:   09/01/21 1452  TempSrc:   PainSc: 8                  Kennieth Rad

## 2021-09-01 NOTE — Progress Notes (Signed)
  Day of Surgery Note    Subjective:  no complaints   Vitals:   09/01/21 0556 09/01/21 1017  BP: (!) 151/77 105/60  Pulse: 79 60  Resp: 16 14  Temp: 98.6 F (37 C) 98 F (36.7 C)  SpO2: 98% 98%    Incisions:   right neck incision is clean without hematoma Neuro:  moving all extremities equally; tongue is midline Lungs:  non labored   Assessment/Plan:  This is a 76 y.o. female who is s/p  Right carotid endarterectomy  -pt neurologically intact in recovery room -to 4 east later this morning -if uneventful evening, anticipate discharge tomorrow   Doreatha Massed, PA-C 09/01/2021 10:40 AM [phone]

## 2021-09-01 NOTE — Progress Notes (Signed)
Patient arrived to 4E from PACU. Vitals checked and stable. Placed on tele and CCMD notified. CHG completed. Patient oriented to unit. Neuro check completed and incision assessed. Call bell within reach.  [name]

## 2021-09-01 NOTE — Anesthesia Preprocedure Evaluation (Signed)
Anesthesia Evaluation  Patient identified by MRN, date of birth, ID band Patient awake    Reviewed: Allergy & Precautions, NPO status , Patient's Chart, lab work & pertinent test results  Airway Mallampati: II  TM Distance: >3 FB Neck ROM: Full    Dental  (+) Dental Advisory Given   Pulmonary asthma , former smoker,    breath sounds clear to auscultation       Cardiovascular hypertension, Pt. on medications + Peripheral Vascular Disease   Rhythm:Regular Rate:Normal     Neuro/Psych negative neurological ROS     GI/Hepatic Neg liver ROS, GERD  ,  Endo/Other  Hypothyroidism   Renal/GU negative Renal ROS     Musculoskeletal  (+) Arthritis ,   Abdominal   Peds  Hematology negative hematology ROS (+)   Anesthesia Other Findings   Reproductive/Obstetrics                             Lab Results  Component Value Date   WBC 7.5 08/29/2021   HGB 12.4 08/29/2021   HCT 37.1 08/29/2021   MCV 91.8 08/29/2021   PLT 287 08/29/2021   Lab Results  Component Value Date   CREATININE 0.66 08/29/2021   BUN 16 08/29/2021   NA 140 08/29/2021   K 3.6 08/29/2021   CL 103 08/29/2021   CO2 30 08/29/2021    Anesthesia Physical Anesthesia Plan  ASA: 3  Anesthesia Plan: General   Post-op Pain Management: Tylenol PO (pre-op)*   Induction: Intravenous  PONV Risk Score and Plan: 3 and Dexamethasone, Ondansetron and Treatment may vary due to age or medical condition  Airway Management Planned: Oral ETT  Additional Equipment: Arterial line  Intra-op Plan:   Post-operative Plan: Extubation in OR  Informed Consent: I have reviewed the patients History and Physical, chart, labs and discussed the procedure including the risks, benefits and alternatives for the proposed anesthesia with the patient or authorized representative who has indicated his/her understanding and acceptance.     Dental  advisory given  Plan Discussed with:   Anesthesia Plan Comments:         Anesthesia Quick Evaluation

## 2021-09-02 LAB — LIPID PANEL
Cholesterol: 101 mg/dL (ref 0–200)
HDL: 63 mg/dL (ref >40)
LDL Cholesterol: 11 mg/dL (ref 0–99)
Total CHOL/HDL Ratio: 1.6 RATIO
Triglycerides: 135 mg/dL (ref <150)
VLDL: 27 mg/dL (ref 0–40)

## 2021-09-02 LAB — CBC
HCT: 28.8 % — ABNORMAL LOW (ref 36.0–46.0)
Hemoglobin: 10 g/dL — ABNORMAL LOW (ref 12.0–15.0)
MCH: 30.8 pg (ref 26.0–34.0)
MCHC: 34.7 g/dL (ref 30.0–36.0)
MCV: 88.6 fL (ref 80.0–100.0)
Platelets: 236 10*3/uL (ref 150–400)
RBC: 3.25 MIL/uL — ABNORMAL LOW (ref 3.87–5.11)
RDW: 14.2 % (ref 11.5–15.5)
WBC: 8.2 10*3/uL (ref 4.0–10.5)
nRBC: 0 % (ref 0.0–0.2)

## 2021-09-02 LAB — BASIC METABOLIC PANEL
Anion gap: 7 (ref 5–15)
BUN: 17 mg/dL (ref 8–23)
CO2: 25 mmol/L (ref 22–32)
Calcium: 9 mg/dL (ref 8.9–10.3)
Chloride: 103 mmol/L (ref 98–111)
Creatinine, Ser: 0.77 mg/dL (ref 0.44–1.00)
GFR, Estimated: >60 mL/min (ref >60)
Glucose, Bld: 97 mg/dL (ref 70–99)
Potassium: 3.5 mmol/L (ref 3.5–5.1)
Sodium: 135 mmol/L (ref 135–145)

## 2021-09-02 MED ORDER — HYDROCODONE-ACETAMINOPHEN 5-325 MG PO TABS: 1.0000 | ORAL_TABLET | Freq: Four times a day (QID) | ORAL | 0 refills | Status: DC | PRN

## 2021-09-02 NOTE — Progress Notes (Signed)
  Progress Note    09/02/2021 5:31 AM 1 Day Post-Op  Subjective:  no complaints, ready to go home   Vitals:   09/02/21 0000 09/02/21 0400  BP: (!) 106/57 118/64  Pulse: 71 76  Resp: 17 19  Temp:    SpO2: 95% 94%   Physical Exam: Cardiac:  regular Lungs:  non labored Incisions:  right neck incision is clean, dry and intact without swelling or hematoma Extremities:  moving all extremities without deficits Neurologic: alert and oriented. Tongue midline. Smile symmetric. Speech coherent  CBC    Component Value Date/Time   WBC 7.5 08/29/2021 0809   RBC 4.04 08/29/2021 0809   HGB 12.4 08/29/2021 0809   HCT 37.1 08/29/2021 0809   PLT 287 08/29/2021 0809   MCV 91.8 08/29/2021 0809   MCH 30.7 08/29/2021 0809   MCHC 33.4 08/29/2021 0809   RDW 14.4 08/29/2021 0809   LYMPHSABS 2.0 03/14/2021 0843   MONOABS 0.5 03/14/2021 0843   EOSABS 0.2 03/14/2021 0843   BASOSABS 0.0 03/14/2021 0843    BMET    Component Value Date/Time   NA 140 08/29/2021 0809   K 3.6 08/29/2021 0809   CL 103 08/29/2021 0809   CO2 30 08/29/2021 0809   GLUCOSE 110 (H) 08/29/2021 0809   BUN 16 08/29/2021 0809   CREATININE 0.66 08/29/2021 0809   CALCIUM 10.1 08/29/2021 0809   GFRNONAA >60 08/29/2021 0809   GFRAA >90 07/17/2012 0853    INR    Component Value Date/Time   INR 1.0 08/29/2021 0809     Intake/Output Summary (Last 24 hours) at 09/02/2021 0531 Last data filed at 09/02/2021 0400 Gross per 24 hour  Intake 2832.5 ml  Output 10 ml  Net 2822.5 ml     Assessment/Plan:  76 y.o. female is s/p Right CEA 1 Day Post-Op   Neurologically intact Right neck incision is clean, dry and intact without swelling or hematoma Hemodynamically stable Will continue on statin and Plavix Stable for discharge home today She will follow up in 2-3 weeks for incision check   Karoline Caldwell, PA-C [facility] [phone] 09/02/2021 5:31 AM

## 2021-09-02 NOTE — Discharge Summary (Signed)
Carotid Discharge Summary     [name] 06-30-1945 76 y.o. female  [id number]  Admission Date: 09/01/2021  Discharge Date: 09/02/2021   Physician: Nada Libman, MD  Admission Diagnosis: Carotid artery stenosis [I65.29] Asymptomatic carotid artery stenosis without infarction, right [facility]  Hospital Course:  The patient was admitted to the hospital and taken to the operating room on 09/01/2021 and underwent right carotid endarterectomy.  The pt tolerated the procedure well and was transported to the PACU in good condition.   By POD 1, the pt neuro status remained intact. Hemodynamically stable. Right neck incision well appearing and intact without swelling or hematoma.   The remainder of the hospital course consisted of increasing mobilization and increasing intake of solids without difficulty.  She remained stable for discharge post operative day 1. She will resume all home medications as prescribed including Plavix and statin. PDMP was reviewed and patient takes regular pain medication but short course of post operative pain medication will be sent to her requested pharmacy. She will follow up in 2-3 weeks for incision check in our office.    No results for input(s): NA, K, CL, CO2, GLUCOSE, BUN, CALCIUM in the last 72 hours.  Invalid input(s): CRATININE No results for input(s): WBC, HGB, HCT, PLT in the last 72 hours. No results for input(s): INR in the last 72 hours.   Discharge Instructions     Call MD for:  difficulty breathing, headache or visual disturbances   Complete by: As directed    Call MD for:  redness, tenderness, or signs of infection (pain, swelling, redness, odor or green/yellow discharge around incision site)   Complete by: As directed    Call MD for:  severe uncontrolled pain   Complete by: As directed    Call MD for:  temperature >100.4   Complete by: As directed    Diet - low sodium heart healthy   Complete by: As directed    Discharge  patient   Complete by: As directed    Discharge disposition: 01-Home or Self Care   Discharge patient date: 09/02/2021   Discharge wound care:   Complete by: As directed    Wash incision with mild soap and water, pat dry. Do not soak in bathtub   Driving Restrictions   Complete by: As directed    No driving while taking narcotic pain medication   Increase activity slowly   Complete by: As directed    Lifting restrictions   Complete by: As directed    No heavy lifting, pushing, pulling > 10 lbs for 2 weeks       Discharge Diagnosis:  Carotid artery stenosis [I65.29] Asymptomatic carotid artery stenosis without infarction, right [I65.21]  Secondary Diagnosis: Patient Active Problem List   Diagnosis Date Noted   Carotid artery stenosis 09/01/2021   Asymptomatic carotid artery stenosis without infarction, right 09/01/2021   Low back pain 12/28/2020   Bilateral primary osteoarthritis of knee 10/29/2019   Past Medical History:  Diagnosis Date   Arthritis    "all over" (07/25/2012)   Asthma    Carotid artery disease (HCC)    Environmental allergies    Fibromyalgia    GERD (gastroesophageal reflux disease)    Hyperlipidemia    Hypertension    Hypothyroidism    Peripheral vascular disease (HCC)    Post-menopausal    on HRT x 25 years    Allergies as of 09/02/2021       Reactions   Naloxegol Other (See  Comments)   Unknown reaction    Pine    Seasonal allergy   Codeine Itching        Medication List     TAKE these medications    amitriptyline 150 MG tablet Commonly known as: ELAVIL Take 150 mg by mouth at bedtime.   amLODipine 10 MG tablet Commonly known as: NORVASC Take 10 mg by mouth daily.   atorvastatin 20 MG tablet Commonly known as: LIPITOR Take 20 mg by mouth daily.   cholecalciferol 25 MCG (1000 UNIT) tablet Commonly known as: VITAMIN D3 Take 1,000 Units by mouth daily.   clopidogrel 75 MG tablet Commonly known as: PLAVIX Take 75 mg by  mouth daily.   estrogens (conjugated) 0.625 MG tablet Commonly known as: PREMARIN Take 0.625 mg by mouth daily. Take daily for 21 days then do not take for 7 days.   Fluticasone-Salmeterol 100-50 MCG/DOSE Aepb Commonly known as: ADVAIR Inhale 1 puff into the lungs 2 (two) times daily as needed (shortness of breath).   folic acid 1 MG tablet Commonly known as: FOLVITE Take 1 mg by mouth daily.   Humira 40 MG/0.8ML Pskt Generic drug: Adalimumab Inject 40 mg into the skin every 14 (fourteen) days.   HYDROcodone-acetaminophen 5-325 MG tablet Commonly known as: NORCO/VICODIN Take 1 tablet by mouth every 6 (six) hours as needed for moderate pain or severe pain.   levothyroxine 100 MCG tablet Commonly known as: SYNTHROID Take 100 mcg by mouth daily before breakfast.   losartan 50 MG tablet Commonly known as: COZAAR Take 50 mg by mouth daily.   meloxicam 15 MG tablet Commonly known as: MOBIC Take 15 mg by mouth daily.   methotrexate 2.5 MG tablet Commonly known as: RHEUMATREX Take 5 mg by mouth 4 (four) times a week. Mon - Thurs   multivitamin with minerals Tabs tablet Take 1 tablet by mouth daily.   pantoprazole 40 MG tablet Commonly known as: PROTONIX Take 40 mg by mouth daily.   pregabalin 50 MG capsule Commonly known as: LYRICA Take 50 mg by mouth daily as needed for pain.   Repatha 140 MG/ML Sosy Generic drug: Evolocumab Inject 140 mg into the skin every 14 (fourteen) days.   traMADol 50 MG tablet Commonly known as: ULTRAM Take 50 mg by mouth 2 (two) times daily as needed for moderate pain.               Discharge Care Instructions  (From admission, onward)           Start     Ordered   09/02/21 0000  Discharge wound care:       Comments: Wash incision with mild soap and water, pat dry. Do not soak in bathtub   09/02/21 0541             Discharge Instructions:   [facility] Discharge Instructions  Carotid Endarterectomy (CEA)  Please refer to the following instructions for your post-procedure care. Your surgeon or physician assistant will discuss any changes with you.  Activity  You are encouraged to walk as much as you can. You can slowly return to normal activities but must avoid strenuous activity and heavy lifting until your doctor tell you it's OK. Avoid activities such as vacuuming or swinging a golf club. You can drive after one week if you are comfortable and you are no longer taking prescription pain medications. It is normal to feel tired for serval weeks after your surgery. It is  also normal to have difficulty with sleep habits, eating, and bowel movements after surgery. These will go away with time.  Bathing/Showering  You may shower after you come home. Do not soak in a bathtub, hot tub, or swim until the incision heals completely.  Incision Care  Shower every day. Clean your incision with mild soap and water. Pat the area dry with a clean towel. You do not need a bandage unless otherwise instructed. Do not apply any ointments or creams to your incision. You may have skin glue on your incision. Do not peel it off. It will come off on its own in about one week. Your incision may feel thickened and raised for several weeks after your surgery. This is normal and the skin will soften over time. For Men Only: It's OK to shave around the incision but do not shave the incision itself for 2 weeks. It is common to have numbness under your chin that could last for several months.  Diet  Resume your normal diet. There are no special food restrictions following this procedure. A low fat/low cholesterol diet is recommended for all patients with vascular disease. In order to heal from your surgery, it is CRITICAL to get adequate nutrition. Your body requires vitamins, minerals, and protein. Vegetables are the best source of vitamins and minerals. Vegetables also provide the perfect balance of  protein. Processed food has little nutritional value, so try to avoid this.  Medications  Resume taking all of your medications unless your doctor or physician assistant tells you not to.  If your incision is causing pain, you may take over-the- counter pain relievers such as acetaminophen (Tylenol). If you were prescribed a stronger pain medication, please be aware these medications can cause nausea and constipation.  Prevent nausea by taking the medication with a snack or meal. Avoid constipation by drinking plenty of fluids and eating foods with a high amount of fiber, such as fruits, vegetables, and grains. Do not take Tylenol if you are taking prescription pain medications.  Follow Up  Our office will schedule a follow up appointment 2-3 weeks following discharge.  Please call us immediately for any of the following conditions  Increased pain, redness, drainage (pus) from your incision site. Fever of 101 degrees or higher. If you should develop stroke (slurred speech, difficulty swallowing, weakness on one side of your body, loss of vision) you should call 911 and go to the nearest emergency room.  Reduce your risk of vascular disease:  Stop smoking. If you would like help call [facility] at 1-800-QUIT-NOW [phone])585-5367) or [place] at [phone]. Manage your cholesterol Maintain a desired weight Control your diabetes Keep your blood pressure down  If you have any questions, please call the office at [phone].  Prescriptions given: Oxycodone- Acetaminophen #16 No Refill  Disposition: Home  Patient's condition: is good  Follow up: 1. Dr. Myra GianottiBrabham in 2 weeks.   Angie Hogg PA-C [facility] [phone]   --- For [facility] use ---   Modified Rankin score at D/C (0-6): 0  IV medication needed for:  1. Hypertension: No 2. Hypotension: No  Post-op Complications: No  1. Post-op CVA or TIA: No  If yes: Event classification (right  eye, left eye, right cortical, left cortical, verterobasilar, other): n/a  If yes: Timing of event (intra-op, <6 hrs post-op, >=6 hrs post-op, unknown): n/a  2. CN injury: No  If yes: CN not injuried   3. Myocardial infarction: No  If yes:  Dx by (EKG or clinical, Troponin): n/a  4.  CHF: No  5.  Dysrhythmia (new): No  6. Wound infection: No  7. Reperfusion symptoms: No  8. Return to OR: No  If yes: return to OR for (bleeding, neurologic, other CEA incision, other): n/a  Discharge medications: Statin use:  Yes ASA use:  No   Beta blocker use:  No ACE-Inhibitor use:  No  ARB use:  Yes CCB use: Yes P2Y12 Antagonist use: Yes, [ X] Plavix, [ ]  Plasugrel, [ ]  Ticlopinine, [ ]  Ticagrelor, [ ]  Other, [ ]  No for medical reason, [ ]  Non-compliant, [ ]  Not-indicated Anti-coagulant use:  No, [ ]  Warfarin, [ ]  Rivaroxaban, [ ]  Dabigatran,

## 2021-09-03 ENCOUNTER — Encounter (HOSPITAL_COMMUNITY): Payer: Self-pay | Admitting: Surgery

## 2021-09-25 ENCOUNTER — Encounter: Payer: Self-pay | Admitting: Surgery

## 2021-09-25 ENCOUNTER — Other Ambulatory Visit (HOSPITAL_COMMUNITY): Payer: Self-pay | Admitting: Surgery

## 2021-09-25 ENCOUNTER — Ambulatory Visit (INDEPENDENT_AMBULATORY_CARE_PROVIDER_SITE_OTHER): Payer: Medicare HMO | Admitting: Surgery

## 2021-09-25 ENCOUNTER — Ambulatory Visit (HOSPITAL_COMMUNITY)
Admission: RE | Admit: 2021-09-25 | Discharge: 2021-09-25 | Disposition: A | Discharge status: Home / Self Care | Payer: Medicare HMO | Source: Ambulatory Visit | Attending: Surgery | Admitting: Surgery

## 2021-09-25 VITALS — BP 99/64 | HR 89 | Temp 98.2°F | Resp 20 | Ht 67.5 in | Wt 150.0 lb

## 2021-09-25 DIAGNOSIS — I6521 Occlusion and stenosis of right carotid artery: Secondary | ICD-10-CM | POA: Diagnosis not present

## 2021-09-25 DIAGNOSIS — I6523 Occlusion and stenosis of bilateral carotid arteries: Secondary | ICD-10-CM

## 2021-09-25 NOTE — Progress Notes (Signed)
Patient name: [name] MRN: [id number] DOB: 09-Aug-1945 Sex: female  REASON FOR VISIT:     Post op  HISTORY OF PRESENT ILLNESS:   [name] is a 76 y.o. female who is status post right carotid endarterectomy with bovine pericardial patch angioplasty for asymptomatic right carotid stenosis on 09/01/2021.  Intraoperative findings included a 90% stenosis with a very calcified carotid bifurcation.  Exposure was challenging due to her neck fusion.  Her postoperative course was uncomplicated and she was discharged home on postoperative day #1.   She began having headaches approximately 3 to 4 days ago on the top of her head and her face.  She states that her blood pressure is not elevated when these occur   Patient has a history of peripheral vascular disease, status post bilateral superficial femoral artery angioplasty in 2012.  She is medically managed for hypertension.  She takes Repatha for hypercholesterolemia.  She is a former smoker.  CURRENT MEDICATIONS:    Current Outpatient Medications  Medication Sig Dispense Refill   Adalimumab (HUMIRA) 40 MG/0.8ML PSKT Inject 40 mg into the skin every 14 (fourteen) days.     amitriptyline (ELAVIL) 150 MG tablet Take 150 mg by mouth at bedtime.     amLODipine (NORVASC) 10 MG tablet Take 10 mg by mouth daily.     atorvastatin (LIPITOR) 20 MG tablet Take 20 mg by mouth daily.     cholecalciferol (VITAMIN D3) 25 MCG (1000 UNIT) tablet Take 1,000 Units by mouth daily.     clopidogrel (PLAVIX) 75 MG tablet Take 75 mg by mouth daily.     estrogens, conjugated, (PREMARIN) 0.625 MG tablet Take 0.625 mg by mouth daily. Take daily for 21 days then do not take for 7 days.     Fluticasone-Salmeterol (ADVAIR) 100-50 MCG/DOSE AEPB Inhale 1 puff into the lungs 2 (two) times daily as needed (shortness of breath).     folic acid (FOLVITE) 1 MG tablet Take 1 mg by mouth daily.     HYDROcodone-acetaminophen (NORCO/VICODIN)  5-325 MG tablet Take 1 tablet by mouth every 6 (six) hours as needed for moderate pain or severe pain. 16 tablet 0   levothyroxine (SYNTHROID) 100 MCG tablet Take 100 mcg by mouth daily before breakfast.     losartan (COZAAR) 50 MG tablet Take 50 mg by mouth daily.     meloxicam (MOBIC) 15 MG tablet Take 15 mg by mouth daily.     methotrexate (RHEUMATREX) 2.5 MG tablet Take 5 mg by mouth 4 (four) times a week. Mon - Thurs     Multiple Vitamin (MULTIVITAMIN WITH MINERALS) TABS tablet Take 1 tablet by mouth daily.     pantoprazole (PROTONIX) 40 MG tablet Take 40 mg by mouth daily.     pregabalin (LYRICA) 50 MG capsule Take 50 mg by mouth daily as needed for pain.     REPATHA 140 MG/ML SOSY Inject 140 mg into the skin every 14 (fourteen) days.     traMADol (ULTRAM) 50 MG tablet Take 50 mg by mouth 2 (two) times daily as needed for moderate pain.     No current facility-administered medications for this visit.    REVIEW OF SYSTEMS:   [X]  denotes positive finding, [ ]  denotes negative finding Cardiac  Comments:  Chest pain or chest pressure:    Shortness of breath upon exertion:    Short of breath when lying flat:    Irregular heart rhythm:    Constitutional    Fever  or chills:      PHYSICAL EXAM:   Vitals:   09/25/21 1124 09/25/21 1126  BP: (!) 89/58 99/64  Pulse: 89   Resp: 20   Temp: 98.2 F (36.8 C)   SpO2: 98%   Weight: 150 lb (68 kg)   Height: 5' 7.5" (1.715 m)     GENERAL: The patient is a well-nourished female, in no acute distress. The vital signs are documented above. CARDIOVASCULAR: There is a regular rate and rhythm. PULMONARY: Non-labored respirations Incision is healing nicely.  I looked at the artery with SonoSite and did not see any abnormalities  STUDIES:   I sent her for a formal duplex study which was unremarkable   MEDICAL ISSUES:   Status post right carotid endarterectomy.  Surgical site is healing nicely.  Ultrasound shows the artery to be widely  patent.  Unclear etiology of her headaches.  I have encouraged her to take Tylenol for the pain and continue her aspirin and Plavix.  She will follow-up in 6 months with repeat ultrasound.  Leia Alf, MD, FACS [facility] [phone] Pager [phone]

## 2021-09-28 ENCOUNTER — Other Ambulatory Visit: Payer: Self-pay | Admitting: *Deleted

## 2021-09-28 DIAGNOSIS — I6523 Occlusion and stenosis of bilateral carotid arteries: Secondary | ICD-10-CM

## 2021-10-11 DIAGNOSIS — M4800 Spinal stenosis, site unspecified: Secondary | ICD-10-CM | POA: Diagnosis not present

## 2021-10-11 DIAGNOSIS — M797 Fibromyalgia: Secondary | ICD-10-CM | POA: Diagnosis not present

## 2021-10-11 DIAGNOSIS — M25561 Pain in right knee: Secondary | ICD-10-CM | POA: Diagnosis not present

## 2021-10-11 DIAGNOSIS — Z79899 Other long term (current) drug therapy: Secondary | ICD-10-CM | POA: Diagnosis not present

## 2021-10-11 DIAGNOSIS — M1990 Unspecified osteoarthritis, unspecified site: Secondary | ICD-10-CM | POA: Diagnosis not present

## 2021-10-11 DIAGNOSIS — L4050 Arthropathic psoriasis, unspecified: Secondary | ICD-10-CM | POA: Diagnosis not present

## 2021-10-11 DIAGNOSIS — K219 Gastro-esophageal reflux disease without esophagitis: Secondary | ICD-10-CM | POA: Diagnosis not present

## 2021-11-13 DIAGNOSIS — R7303 Prediabetes: Secondary | ICD-10-CM | POA: Diagnosis not present

## 2021-11-13 DIAGNOSIS — E039 Hypothyroidism, unspecified: Secondary | ICD-10-CM | POA: Diagnosis not present

## 2021-11-13 DIAGNOSIS — E7800 Pure hypercholesterolemia, unspecified: Secondary | ICD-10-CM | POA: Diagnosis not present

## 2021-11-13 DIAGNOSIS — I10 Essential (primary) hypertension: Secondary | ICD-10-CM | POA: Diagnosis not present

## 2021-11-27 ENCOUNTER — Telehealth: Payer: Self-pay

## 2021-11-27 DIAGNOSIS — Z1231 Encounter for screening mammogram for malignant neoplasm of breast: Secondary | ICD-10-CM | POA: Diagnosis not present

## 2021-11-27 NOTE — Telephone Encounter (Signed)
VOB submitted for Orthovisc, right knee   

## 2021-12-21 ENCOUNTER — Ambulatory Visit: Payer: PRIVATE HEALTH INSURANCE | Admitting: Internal Medicine

## 2021-12-26 ENCOUNTER — Ambulatory Visit: Payer: PRIVATE HEALTH INSURANCE | Admitting: Internal Medicine

## 2022-01-09 ENCOUNTER — Encounter: Payer: Self-pay | Admitting: Orthopaedic Surgery

## 2022-01-09 ENCOUNTER — Ambulatory Visit (INDEPENDENT_AMBULATORY_CARE_PROVIDER_SITE_OTHER): Payer: Medicare HMO | Admitting: Orthopaedic Surgery

## 2022-01-09 DIAGNOSIS — M170 Bilateral primary osteoarthritis of knee: Secondary | ICD-10-CM

## 2022-01-09 DIAGNOSIS — M1711 Unilateral primary osteoarthritis, right knee: Secondary | ICD-10-CM

## 2022-01-09 MED ORDER — HYALURONAN 30 MG/2ML IX SOSY
30.0000 mg | PREFILLED_SYRINGE | INTRA_ARTICULAR | Status: AC | PRN
  Administered 2022-01-09: 30 mg via INTRA_ARTICULAR

## 2022-01-09 NOTE — Progress Notes (Signed)
Office Visit Note   Patient: [name]           Date of Birth: [birth year]           MRN: [id number] Visit Date: 01/09/2022              Requested by: Janie Morning, DO [address] [place],  [place] [id number] PCP: Janie Morning, DO   Assessment & Plan: Visit Diagnoses:  1. Bilateral primary osteoarthritis of knee     Plan: Ms. Haverstick is a pleasant 76 year old woman with a history of osteoarthritis of her right knee.  She comes in today for her first Orthovisc injection.  She does not really have an effusion today she has no warmth no redness.  She tolerated the injection well  Follow-Up Instructions: 1 week  Orders:  No orders of the defined types were placed in this encounter.  No orders of the defined types were placed in this encounter.     Procedures: Large Joint Inj: R knee on 01/09/2022 2:29 PM Indications: pain and diagnostic evaluation Details: 25 G 1.5 in needle, anteromedial approach  Arthrogram: No  Medications: 30 mg Hyaluronan 30 MG/2ML Outcome: tolerated well, no immediate complications Procedure, treatment alternatives, risks and benefits explained, specific risks discussed. Consent was given by the patient. Immediately prior to procedure a time out was called to verify the correct patient, procedure, equipment, support staff and site/side marked as required. Patient was prepped and draped in the usual sterile fashion.     Clinical Data: No additional findings.   Subjective: Chief Complaint  Patient presents with  . Right Knee - Pain    HPI patient is here for her first Orthovisc injection into her right knee  Review of Systems  All other systems reviewed and are negative.    Objective: Vital Signs: There were no vitals taken for this visit.  Physical Exam Constitutional:      Appearance: Normal appearance.  Pulmonary:     Effort: Pulmonary effort is normal.  Neurological:     Mental Status: She is alert.    Ortho  Exam Right knee minimal effusion no warmth no redness compartments are soft and nontender Specialty Comments:  No specialty comments available.  Imaging: No results found.   PMFS History: Patient Active Problem List   Diagnosis Date Noted  . Carotid artery stenosis 09/01/2021  . Asymptomatic carotid artery stenosis without infarction, right 09/01/2021  . Low back pain 12/28/2020  . Bilateral primary osteoarthritis of knee 10/29/2019   Past Medical History:  Diagnosis Date  . Arthritis    "all over" (07/25/2012)  . Asthma   . Carotid artery disease ([place])   . Environmental allergies   . Fibromyalgia   . GERD (gastroesophageal reflux disease)   . Hyperlipidemia   . Hypertension   . Hypothyroidism   . Peripheral vascular disease ([place])   . Post-menopausal    on HRT x 25 years    Family History  Problem Relation Age of Onset  . Heart failure Mother   . Hypertension Father   . Hypertension Sister   . Hypertension Brother   . Hypertension Brother   . Heart disease Brother     Past Surgical History:  Procedure Laterality Date  . ABDOMINAL HYSTERECTOMY  ~ 1977  . ANTERIOR CERVICAL DECOMPRESSION/DISCECTOMY FUSION 4 LEVELS N/A 07/23/2012   Procedure: ANTERIOR CERVICAL DECOMPRESSION/DISCECTOMY FUSION 3 LEVELS;  Surgeon: Sinclair Ship, MD;  Location: [facility];  Service: Orthopedics;  Laterality: N/A;  Anterior cervical decompression fusion, cervical 3-4, cervical 4-5, cervical 5-6 with instrumentation and allograft.  Marland Kitchen ENDARTERECTOMY Right 09/01/2021   Procedure: RIGHT CAROTID ENDARTERECTOMY;  Surgeon: Serafina Mitchell, MD;  Location: [facility] OR;  Service: Vascular;  Laterality: Right;  . FEMORAL ARTERY STENT Bilateral 2011  . LUMBAR LAMINECTOMY/DECOMPRESSION MICRODISCECTOMY N/A 03/22/2021   Procedure: LUMBAR 4 - LUMBAR 5 DECOMPRESSION;  Surgeon: Phylliss Bob, MD;  Location: [facility];  Service: Orthopedics;  Laterality: N/A;  . PATCH ANGIOPLASTY Right 09/01/2021   Procedure: PATCH  ANGIOPLASTY OF RIGHT CAROTID ARTERY USING XENOSURE BOVINE PERICARDIUM PATCH;  Surgeon: Serafina Mitchell, MD;  Location: [facility];  Service: Vascular;  Laterality: Right;  . POSTERIOR CERVICAL FUSION/FORAMINOTOMY N/A 07/24/2012   Procedure: POSTERIOR CERVICAL FUSION/FORAMINOTOMY LEVEL 5;  Surgeon: Sinclair Ship, MD;  Location: [facility];  Service: Orthopedics;  Laterality: N/A;  Posterior cervical decompression, cervical 6-7, C7-T1, T1-T2. Cervical fusion cervical 3-4, cervical 4-5, cervical 5-6, cervical 6-7, cervical 7-T1 with allograft, local autograft.  Marland Kitchen POSTERIOR FUSION CERVICAL SPINE  07/24/2012   C3-T2 posterior fusion with instrumentation and allograft, C6-T2 decompression)   . TONSILLECTOMY  1960's?   Social History   Occupational History  . Not on file  Tobacco Use  . Smoking status: Former    Packs/day: 0.25    Years: 30.00    Total pack years: 7.50    Types: Cigarettes    Quit date: 12/15/2020    Years since quitting: 1.0  . Smokeless tobacco: Never  Vaping Use  . Vaping Use: Never used  Substance and Sexual Activity  . Alcohol use: Not Currently  . Drug use: No  . Sexual activity: Yes

## 2022-01-16 ENCOUNTER — Encounter: Payer: Self-pay | Admitting: Orthopaedic Surgery

## 2022-01-16 ENCOUNTER — Other Ambulatory Visit: Payer: Self-pay

## 2022-01-16 ENCOUNTER — Ambulatory Visit (INDEPENDENT_AMBULATORY_CARE_PROVIDER_SITE_OTHER): Payer: Medicare HMO | Admitting: Orthopaedic Surgery

## 2022-01-16 DIAGNOSIS — M170 Bilateral primary osteoarthritis of knee: Secondary | ICD-10-CM

## 2022-01-16 MED ORDER — HYALURONAN 30 MG/2ML IX SOSY
30.0000 mg | PREFILLED_SYRINGE | INTRA_ARTICULAR | Status: AC | PRN
  Administered 2022-01-16: 30 mg via INTRA_ARTICULAR

## 2022-01-16 NOTE — Progress Notes (Signed)
Office Visit Note   Patient: [name]           Date of Birth: 1945/09/03           MRN: [id number] Visit Date: 01/16/2022              Requested by: Irena Reichmann, DO [address] [place],  [place] [address] PCP: Irena Reichmann, DO   Assessment & Plan: Visit Diagnoses:  1. Bilateral primary osteoarthritis of knee     Plan: [name] comes in today for her second Orthovisc injection into her right knee.  She tolerated the first injection well.  She is unsure if it has been helpful yet  Follow-Up Instructions: No follow-ups on file.   Orders:  No orders of the defined types were placed in this encounter.  No orders of the defined types were placed in this encounter.     Procedures: Large Joint Inj: R knee on 01/16/2022 2:27 PM Indications: pain and diagnostic evaluation Details: 25 G 1.5 in needle, anteromedial approach  Arthrogram: No  Medications: 30 mg Hyaluronan 30 MG/2ML Outcome: tolerated well, no immediate complications Procedure, treatment alternatives, risks and benefits explained, specific risks discussed. Consent was given by the patient.     Clinical Data: No additional findings.   Subjective: Chief Complaint  Patient presents with  . Right Knee - Pain, Follow-up    HPI  Review of Systems   Objective: Vital Signs: There were no vitals taken for this visit.  Physical Exam  Ortho Exam  Specialty Comments:  No specialty comments available.  Imaging: No results found.   PMFS History: Patient Active Problem List   Diagnosis Date Noted  . Carotid artery stenosis 09/01/2021  . Asymptomatic carotid artery stenosis without infarction, right 09/01/2021  . Low back pain 12/28/2020  . Bilateral primary osteoarthritis of knee 10/29/2019   Past Medical History:  Diagnosis Date  . Arthritis    "all over" (07/25/2012)  . Asthma   . Carotid artery disease (HCC)   . Environmental allergies   . Fibromyalgia   . GERD  (gastroesophageal reflux disease)   . Hyperlipidemia   . Hypertension   . Hypothyroidism   . Peripheral vascular disease (HCC)   . Post-menopausal    on HRT x 25 years    Family History  Problem Relation Age of Onset  . Heart failure Mother   . Hypertension Father   . Hypertension Sister   . Hypertension Brother   . Hypertension Brother   . Heart disease Brother     Past Surgical History:  Procedure Laterality Date  . ABDOMINAL HYSTERECTOMY  ~ 1977  . ANTERIOR CERVICAL DECOMPRESSION/DISCECTOMY FUSION 4 LEVELS N/A 07/23/2012   Procedure: ANTERIOR CERVICAL DECOMPRESSION/DISCECTOMY FUSION 3 LEVELS;  Surgeon: Emilee Hero, MD;  Location: [facility] OR;  Service: Orthopedics;  Laterality: N/A;  Anterior cervical decompression fusion, cervical 3-4, cervical 4-5, cervical 5-6 with instrumentation and allograft.  Marland Kitchen ENDARTERECTOMY Right 09/01/2021   Procedure: RIGHT CAROTID ENDARTERECTOMY;  Surgeon: Nada Libman, MD;  Location: [facility] OR;  Service: Vascular;  Laterality: Right;  . FEMORAL ARTERY STENT Bilateral 2011  . LUMBAR LAMINECTOMY/DECOMPRESSION MICRODISCECTOMY N/A 03/22/2021   Procedure: LUMBAR 4 - LUMBAR 5 DECOMPRESSION;  Surgeon: Estill Bamberg, MD;  Location: [facility] OR;  Service: Orthopedics;  Laterality: N/A;  . PATCH ANGIOPLASTY Right 09/01/2021   Procedure: PATCH ANGIOPLASTY OF RIGHT CAROTID ARTERY USING XENOSURE BOVINE PERICARDIUM PATCH;  Surgeon: Nada Libman, MD;  Location: [facility]  OR;  Service: Vascular;  Laterality: Right;  . POSTERIOR CERVICAL FUSION/FORAMINOTOMY N/A 07/24/2012   Procedure: POSTERIOR CERVICAL FUSION/FORAMINOTOMY LEVEL 5;  Surgeon: Sinclair Ship, MD;  Location: [place];  Service: Orthopedics;  Laterality: N/A;  Posterior cervical decompression, cervical 6-7, C7-T1, T1-T2. Cervical fusion cervical 3-4, cervical 4-5, cervical 5-6, cervical 6-7, cervical 7-T1 with allograft, local autograft.  Marland Kitchen POSTERIOR FUSION CERVICAL SPINE  07/24/2012   C3-T2 posterior fusion with  instrumentation and allograft, C6-T2 decompression)   . TONSILLECTOMY  1960's?   Social History   Occupational History  . Not on file  Tobacco Use  . Smoking status: Former    Packs/day: 0.25    Years: 30.00    Total pack years: 7.50    Types: Cigarettes    Quit date: 12/15/2020    Years since quitting: 1.0  . Smokeless tobacco: Never  Vaping Use  . Vaping Use: Never used  Substance and Sexual Activity  . Alcohol use: Not Currently  . Drug use: No  . Sexual activity: Yes

## 2022-01-23 ENCOUNTER — Ambulatory Visit (INDEPENDENT_AMBULATORY_CARE_PROVIDER_SITE_OTHER): Payer: Medicare HMO | Admitting: Orthopaedic Surgery

## 2022-01-23 ENCOUNTER — Encounter: Payer: Self-pay | Admitting: Orthopaedic Surgery

## 2022-01-23 DIAGNOSIS — M170 Bilateral primary osteoarthritis of knee: Secondary | ICD-10-CM

## 2022-01-23 DIAGNOSIS — M1711 Unilateral primary osteoarthritis, right knee: Secondary | ICD-10-CM | POA: Diagnosis not present

## 2022-01-23 MED ORDER — HYALURONAN 30 MG/2ML IX SOSY
30.0000 mg | PREFILLED_SYRINGE | INTRA_ARTICULAR | Status: AC | PRN
  Administered 2022-01-23: 30 mg via INTRA_ARTICULAR

## 2022-01-23 NOTE — Progress Notes (Signed)
Office Visit Note   Patient: [name]           Date of Birth: 1946-01-13           MRN: [id number] Visit Date: 01/23/2022              Requested by: Janie Morning, DO [address],  [place] [id number] PCP: Janie Morning, DO  Chief Complaint  Patient presents with  . Right Knee - Pain, Follow-up      HPI: Patient is a pleasant 76 year old woman who comes in for her third Orthovisc injection into her right knee she tolerated the other quite well she thinks she has had some improvement  Assessment & Plan: Visit Diagnoses:  1. Bilateral primary osteoarthritis of knee     Plan: She may follow-up as needed.  She understands that she can get a cortisone injection in between if she has return of pain  Follow-Up Instructions: Return if symptoms worsen or fail to improve.   Ortho Exam  Patient is alert, oriented, no adenopathy, well-dressed, normal affect, normal respiratory effort. Right knee no effusion no redness no erythema compartments of the lower leg are soft and nontender  Imaging: No results found. No images are attached to the encounter.  Labs: No results found for: "HGBA1C", "ESRSEDRATE", "CRP", "LABURIC", "REPTSTATUS", "GRAMSTAIN", "CULT", "LABORGA"   Lab Results  Component Value Date   ALBUMIN 3.8 08/29/2021   ALBUMIN 4.1 03/14/2021   ALBUMIN 4.0 07/17/2012    No results found for: "MG" No results found for: "VD25OH"  No results found for: "PREALBUMIN"    Latest Ref Rng & Units 09/02/2021    6:00 AM 08/29/2021    8:09 AM 03/14/2021    8:43 AM  CBC EXTENDED  WBC 4.0 - 10.5 K/uL 8.2  7.5  5.3   RBC 3.87 - 5.11 MIL/uL 3.25  4.04  4.43   Hemoglobin 12.0 - 15.0 g/dL 10.0  12.4  13.9   HCT 36.0 - 46.0 % 28.8  37.1  41.1   Platelets 150 - 400 K/uL 236  287  259   NEUT# 1.7 - 7.7 K/uL   2.6   Lymph# 0.7 - 4.0 K/uL   2.0      There is no height or weight on file to calculate BMI.  Orders:  No orders of the defined types were  placed in this encounter.  No orders of the defined types were placed in this encounter.    Procedures: Large Joint Inj: R knee on 01/23/2022 12:56 PM Indications: pain and diagnostic evaluation Details: 25 G 1.5 in needle, anteromedial approach  Arthrogram: No  Medications: 30 mg Hyaluronan 30 MG/2ML Outcome: tolerated well, no immediate complications Procedure, treatment alternatives, risks and benefits explained, specific risks discussed. Consent was given by the patient.    Clinical Data: No additional findings.  ROS:  All other systems negative, except as noted in the HPI. Review of Systems  Objective: Vital Signs: There were no vitals taken for this visit.  Specialty Comments:  No specialty comments available.  PMFS History: Patient Active Problem List   Diagnosis Date Noted  . Carotid artery stenosis 09/01/2021  . Asymptomatic carotid artery stenosis without infarction, right 09/01/2021  . Low back pain 12/28/2020  . Bilateral primary osteoarthritis of knee 10/29/2019   Past Medical History:  Diagnosis Date  . Arthritis    "all over" (07/25/2012)  . Asthma   . Carotid artery disease ([facility])   .  Environmental allergies   . Fibromyalgia   . GERD (gastroesophageal reflux disease)   . Hyperlipidemia   . Hypertension   . Hypothyroidism   . Peripheral vascular disease (HCC)   . Post-menopausal    on HRT x 25 years    Family History  Problem Relation Age of Onset  . Heart failure Mother   . Hypertension Father   . Hypertension Sister   . Hypertension Brother   . Hypertension Brother   . Heart disease Brother     Past Surgical History:  Procedure Laterality Date  . ABDOMINAL HYSTERECTOMY  ~ 1977  . ANTERIOR CERVICAL DECOMPRESSION/DISCECTOMY FUSION 4 LEVELS N/A 07/23/2012   Procedure: ANTERIOR CERVICAL DECOMPRESSION/DISCECTOMY FUSION 3 LEVELS;  Surgeon: Emilee Hero, MD;  Location: [facility] OR;  Service: Orthopedics;  Laterality: N/A;  Anterior cervical  decompression fusion, cervical 3-4, cervical 4-5, cervical 5-6 with instrumentation and allograft.  Marland Kitchen ENDARTERECTOMY Right 09/01/2021   Procedure: RIGHT CAROTID ENDARTERECTOMY;  Surgeon: Nada Libman, MD;  Location: [facility] OR;  Service: Vascular;  Laterality: Right;  . FEMORAL ARTERY STENT Bilateral 2011  . LUMBAR LAMINECTOMY/DECOMPRESSION MICRODISCECTOMY N/A 03/22/2021   Procedure: LUMBAR 4 - LUMBAR 5 DECOMPRESSION;  Surgeon: Estill Bamberg, MD;  Location: [facility] OR;  Service: Orthopedics;  Laterality: N/A;  . PATCH ANGIOPLASTY Right 09/01/2021   Procedure: PATCH ANGIOPLASTY OF RIGHT CAROTID ARTERY USING XENOSURE BOVINE PERICARDIUM PATCH;  Surgeon: Nada Libman, MD;  Location: [facility] OR;  Service: Vascular;  Laterality: Right;  . POSTERIOR CERVICAL FUSION/FORAMINOTOMY N/A 07/24/2012   Procedure: POSTERIOR CERVICAL FUSION/FORAMINOTOMY LEVEL 5;  Surgeon: Emilee Hero, MD;  Location: [facility] OR;  Service: Orthopedics;  Laterality: N/A;  Posterior cervical decompression, cervical 6-7, C7-T1, T1-T2. Cervical fusion cervical 3-4, cervical 4-5, cervical 5-6, cervical 6-7, cervical 7-T1 with allograft, local autograft.  Marland Kitchen POSTERIOR FUSION CERVICAL SPINE  07/24/2012   C3-T2 posterior fusion with instrumentation and allograft, C6-T2 decompression)   . TONSILLECTOMY  1960's?   Social History   Occupational History  . Not on file  Tobacco Use  . Smoking status: Former    Packs/day: 0.25    Years: 30.00    Total pack years: 7.50    Types: Cigarettes    Quit date: 12/15/2020    Years since quitting: 1.1  . Smokeless tobacco: Never  Vaping Use  . Vaping Use: Never used  Substance and Sexual Activity  . Alcohol use: Not Currently  . Drug use: No  . Sexual activity: Yes

## 2022-01-25 DIAGNOSIS — M25561 Pain in right knee: Secondary | ICD-10-CM | POA: Diagnosis not present

## 2022-01-25 DIAGNOSIS — M79641 Pain in right hand: Secondary | ICD-10-CM | POA: Diagnosis not present

## 2022-01-25 DIAGNOSIS — M797 Fibromyalgia: Secondary | ICD-10-CM | POA: Diagnosis not present

## 2022-01-25 DIAGNOSIS — M1990 Unspecified osteoarthritis, unspecified site: Secondary | ICD-10-CM | POA: Diagnosis not present

## 2022-01-25 DIAGNOSIS — M79642 Pain in left hand: Secondary | ICD-10-CM | POA: Diagnosis not present

## 2022-01-25 DIAGNOSIS — K219 Gastro-esophageal reflux disease without esophagitis: Secondary | ICD-10-CM | POA: Diagnosis not present

## 2022-01-25 DIAGNOSIS — Z79899 Other long term (current) drug therapy: Secondary | ICD-10-CM | POA: Diagnosis not present

## 2022-01-25 DIAGNOSIS — M4800 Spinal stenosis, site unspecified: Secondary | ICD-10-CM | POA: Diagnosis not present

## 2022-01-25 DIAGNOSIS — L4050 Arthropathic psoriasis, unspecified: Secondary | ICD-10-CM | POA: Diagnosis not present

## 2022-02-13 DIAGNOSIS — I10 Essential (primary) hypertension: Secondary | ICD-10-CM | POA: Diagnosis not present

## 2022-02-13 DIAGNOSIS — E7800 Pure hypercholesterolemia, unspecified: Secondary | ICD-10-CM | POA: Diagnosis not present

## 2022-02-13 DIAGNOSIS — E039 Hypothyroidism, unspecified: Secondary | ICD-10-CM | POA: Diagnosis not present

## 2022-02-13 DIAGNOSIS — R7303 Prediabetes: Secondary | ICD-10-CM | POA: Diagnosis not present

## 2022-02-26 ENCOUNTER — Encounter: Payer: Self-pay | Admitting: Internal Medicine

## 2022-02-26 ENCOUNTER — Ambulatory Visit: Payer: Medicare HMO | Admitting: Internal Medicine

## 2022-02-26 VITALS — BP 143/81 | HR 79 | Ht 67.5 in | Wt 145.8 lb

## 2022-02-26 DIAGNOSIS — E7800 Pure hypercholesterolemia, unspecified: Secondary | ICD-10-CM

## 2022-02-26 DIAGNOSIS — I10 Essential (primary) hypertension: Secondary | ICD-10-CM | POA: Diagnosis not present

## 2022-02-26 DIAGNOSIS — I6521 Occlusion and stenosis of right carotid artery: Secondary | ICD-10-CM | POA: Diagnosis not present

## 2022-02-26 NOTE — Progress Notes (Signed)
Primary Physician/Referring:  Janie Morning, DO  Patient ID: [name], female    DOB: [birth year], 76 y.o.   MRN: [id number]  Chief Complaint  Patient presents with   Stenosis of right carotid artery   Follow-up   HPI:    [name]  is a 76 y.o. African-American female with peripheral arterial disease, history of bilateral SFA angioplasty in 2012, essential hypertension, hyperlipidemia.  Patient presents for 1 year follow-up.  She has been doing well since the last time she was here. Her blood pressure is always slightly elevated when she comes here but otherwise it is very well controlled. Denies chest pain, shortness of breath, palpitations, diaphoresis, syncope, edema, PND, orthopnea.   Past Medical History:  Diagnosis Date   Arthritis    "all over" (07/25/2012)   Asthma    Carotid artery disease (HCC)    Environmental allergies    Fibromyalgia    GERD (gastroesophageal reflux disease)    Hyperlipidemia    Hypertension    Hypothyroidism    Peripheral vascular disease ([place])    Post-menopausal    on HRT x 25 years   Past Surgical History:  Procedure Laterality Date   ABDOMINAL HYSTERECTOMY  ~ 1977   ANTERIOR CERVICAL DECOMPRESSION/DISCECTOMY FUSION 4 LEVELS N/A 07/23/2012   Procedure: ANTERIOR CERVICAL DECOMPRESSION/DISCECTOMY FUSION 3 LEVELS;  Surgeon: Sinclair Ship, MD;  Location: [place];  Service: Orthopedics;  Laterality: N/A;  Anterior cervical decompression fusion, cervical 3-4, cervical 4-5, cervical 5-6 with instrumentation and allograft.   ENDARTERECTOMY Right 09/01/2021   Procedure: RIGHT CAROTID ENDARTERECTOMY;  Surgeon: Serafina Mitchell, MD;  Location: [facility] OR;  Service: Vascular;  Laterality: Right;   FEMORAL ARTERY STENT Bilateral 2011   LUMBAR LAMINECTOMY/DECOMPRESSION MICRODISCECTOMY N/A 03/22/2021   Procedure: LUMBAR 4 - LUMBAR 5 DECOMPRESSION;  Surgeon: Phylliss Bob, MD;  Location: [place];  Service: Orthopedics;  Laterality: N/A;   PATCH  ANGIOPLASTY Right 09/01/2021   Procedure: PATCH ANGIOPLASTY OF RIGHT CAROTID ARTERY USING XENOSURE BOVINE PERICARDIUM PATCH;  Surgeon: Serafina Mitchell, MD;  Location: [place];  Service: Vascular;  Laterality: Right;   POSTERIOR CERVICAL FUSION/FORAMINOTOMY N/A 07/24/2012   Procedure: POSTERIOR CERVICAL FUSION/FORAMINOTOMY LEVEL 5;  Surgeon: Sinclair Ship, MD;  Location: [place];  Service: Orthopedics;  Laterality: N/A;  Posterior cervical decompression, cervical 6-7, C7-T1, T1-T2. Cervical fusion cervical 3-4, cervical 4-5, cervical 5-6, cervical 6-7, cervical 7-T1 with allograft, local autograft.   POSTERIOR FUSION CERVICAL SPINE  07/24/2012   C3-T2 posterior fusion with instrumentation and allograft, C6-T2 decompression)    TONSILLECTOMY  1960's?   Social History   Tobacco Use   Smoking status: Former    Packs/day: 0.25    Years: 30.00    Total pack years: 7.50    Types: Cigarettes    Quit date: 12/15/2020    Years since quitting: 1.2   Smokeless tobacco: Never  Substance Use Topics   Alcohol use: Not Currently    ROS  Review of Systems  Cardiovascular:  Negative for chest pain, claudication, leg swelling, near-syncope, orthopnea, palpitations, paroxysmal nocturnal dyspnea and syncope.  Respiratory:  Negative for shortness of breath.   Neurological:  Negative for dizziness.   Objective  Blood pressure (!) 143/81, pulse 79, height 5' 7.5" (1.715 m), weight 145 lb 12.8 oz (66.1 kg), SpO2 98 %.     02/26/2022    8:39 AM 09/25/2021   11:26 AM 09/25/2021   11:24 AM  Vitals with BMI  Height 5'  7.5"  5' 7.5"  Weight 145 lbs 13 oz  150 lbs  BMI 97.41  63.84  Systolic 536 99 89  Diastolic 81 64 58  Pulse 79  89     Physical Exam Vitals reviewed.  Neck:     Thyroid: No thyromegaly.  Cardiovascular:     Rate and Rhythm: Normal rate and regular rhythm.     Pulses: Intact distal pulses.          Carotid pulses are 2+ on the right side with bruit and 2+ on the left side.       Radial pulses are 2+ on the right side and 2+ on the left side.       Femoral pulses are 2+ on the right side and 2+ on the left side.      Popliteal pulses are 1+ on the right side and 1+ on the left side.       Dorsalis pedis pulses are 1+ on the right side and 1+ on the left side.       Posterior tibial pulses are 0 on the right side and 0 on the left side.     Heart sounds: Murmur heard.     Scratchy early systolic murmur is present with a grade of 2/6 at the upper right sternal border.     No gallop.     Comments: No open wounds or ulcers. Pulmonary:     Effort: Pulmonary effort is normal.     Breath sounds: Normal breath sounds.  Musculoskeletal:     Cervical back: Neck supple.     Right lower leg: No edema.     Left lower leg: No edema.    Laboratory examination:   Recent Labs    03/14/21 0843 08/29/21 0809 09/02/21 0600  NA 137 140 135  K 3.1* 3.6 3.5  CL 101 103 103  CO2 _0 GLUCOSE 102* 110* 97  BUN _1 CREATININE 0.61 0.66 0.77  CALCIUM 9.9 10.1 9.0  GFRNONAA >60 >60 >60    CrCl cannot be calculated (Patient's most recent lab result is older than the maximum 21 days allowed.).     Latest Ref Rng & Units 09/02/2021    6:00 AM 08/29/2021    8:09 AM 03/14/2021    8:43 AM  CMP  Glucose 70 - 99 mg/dL 97  110  102   BUN 8 - 23 mg/dL _2 Creatinine 0.44 - 1.00 mg/dL 0.77  0.66  0.61   Sodium 135 - 145 mmol/L 135  140  137   Potassium 3.5 - 5.1 mmol/L 3.5  3.6  3.1   Chloride 98 - 111 mmol/L 103  103  101   CO2 22 - 32 mmol/L _3 Calcium 8.9 - 10.3 mg/dL 9.0  10.1  9.9   Total Protein 6.5 - 8.1 g/dL  7.4  7.3   Total Bilirubin 0.3 - 1.2 mg/dL  0.9  0.9   Alkaline Phos 38 - 126 U/L  87  49   AST 15 - 41 U/L  19  17   ALT 0 - 44 U/L  12  11       Latest Ref Rng & Units 09/02/2021    6:00 AM 08/29/2021    8:09 AM 03/14/2021    8:43 AM  CBC  WBC 4.0 - 10.5 K/uL 8.2  7.5  5.3   Hemoglobin  12.0 - 15.0 g/dL 10.0  12.4  13.9    Hematocrit 36.0 - 46.0 % 28.8  37.1  41.1   Platelets 150 - 400 K/uL 236  287  259    Lipid Panel     Component Value Date/Time   CHOL 101 09/02/2021 0600   CHOL 104 07/13/2020 0847   TRIG 135 09/02/2021 0600   HDL 63 09/02/2021 0600   HDL 76 07/13/2020 0847   CHOLHDL 1.6 09/02/2021 0600   VLDL 27 09/02/2021 0600   LDLCALC 11 09/02/2021 0600   LDLCALC 15 07/13/2020 0847   HEMOGLOBIN A1C No results found for: "HGBA1C", "MPG" TSH No results for input(s): "TSH" in the last 8760 hours.  05/31/2020: Hemoglobin 13.3, hematocrit 39.6, MCV 91.2, platelets 291 Glucose 103, BUN 11, creatinine 0.69, GFR 86, sodium 140, potassium 3.9, AST 14, ALT 7 alk phos 83  Labs  01/05/19  Cholesterol, total 105 HDL 66 01/05/2019 LDL 29 05/31/2016 Triglycerides 65.000 01/05/2019  Creatinine, Serum 0.500 MG/ 01/05/2019 Potassium 4.400 MM 05/31/2016 Magnesium N/D ALT (SGPT) 11.000 IU/ 01/05/2019 TSH 1.360 01/05/2019  Allergies   Allergies  Allergen Reactions   Naloxegol Other (See Comments)    Unknown reaction    Penicillin G     Other reaction(s): Unknown   Pine     Seasonal allergy   Codeine Itching    Medications Prior to Visit:   Outpatient Medications Prior to Visit  Medication Sig Dispense Refill   Adalimumab (HUMIRA) 40 MG/0.8ML PSKT Inject 40 mg into the skin every 14 (fourteen) days.     amitriptyline (ELAVIL) 150 MG tablet Take 150 mg by mouth at bedtime.     amLODipine (NORVASC) 10 MG tablet Take 10 mg by mouth daily.     atorvastatin (LIPITOR) 20 MG tablet Take 20 mg by mouth daily.     cholecalciferol (VITAMIN D3) 25 MCG (1000 UNIT) tablet Take 1,000 Units by mouth daily.     clopidogrel (PLAVIX) 75 MG tablet Take 75 mg by mouth daily.     estrogens, conjugated, (PREMARIN) 0.625 MG tablet Take 0.625 mg by mouth daily. Take daily for 21 days then do not take for 7 days.     Fluticasone-Salmeterol (ADVAIR) 100-50 MCG/DOSE AEPB Inhale 1 puff into the lungs 2 (two) times daily as needed  (shortness of breath).     folic acid (FOLVITE) 1 MG tablet Take 1 mg by mouth daily.     HYDROcodone-acetaminophen (NORCO/VICODIN) 5-325 MG tablet Take 1 tablet by mouth every 6 (six) hours as needed for moderate pain or severe pain. 16 tablet 0   levothyroxine (SYNTHROID) 100 MCG tablet Take 100 mcg by mouth daily before breakfast.     losartan (COZAAR) 50 MG tablet Take 50 mg by mouth daily.     meloxicam (MOBIC) 15 MG tablet Take 15 mg by mouth daily.     methotrexate (RHEUMATREX) 2.5 MG tablet Take 5 mg by mouth 4 (four) times a week. Mon - Thurs     Multiple Vitamin (MULTIVITAMIN WITH MINERALS) TABS tablet Take 1 tablet by mouth daily.     pantoprazole (PROTONIX) 40 MG tablet Take 40 mg by mouth daily.     pregabalin (LYRICA) 50 MG capsule Take 50 mg by mouth daily as needed for pain.     REPATHA 140 MG/ML SOSY Inject 140 mg into the skin every 14 (fourteen) days.     traMADol (ULTRAM) 50 MG tablet Take 50 mg by mouth 2 (two) times daily as needed  for moderate pain.     No facility-administered medications prior to visit.   Final Medications at End of Visit    Current Meds  Medication Sig   Adalimumab (HUMIRA) 40 MG/0.8ML PSKT Inject 40 mg into the skin every 14 (fourteen) days.   amitriptyline (ELAVIL) 150 MG tablet Take 150 mg by mouth at bedtime.   amLODipine (NORVASC) 10 MG tablet Take 10 mg by mouth daily.   atorvastatin (LIPITOR) 20 MG tablet Take 20 mg by mouth daily.   cholecalciferol (VITAMIN D3) 25 MCG (1000 UNIT) tablet Take 1,000 Units by mouth daily.   clopidogrel (PLAVIX) 75 MG tablet Take 75 mg by mouth daily.   estrogens, conjugated, (PREMARIN) 0.625 MG tablet Take 0.625 mg by mouth daily. Take daily for 21 days then do not take for 7 days.   Fluticasone-Salmeterol (ADVAIR) 100-50 MCG/DOSE AEPB Inhale 1 puff into the lungs 2 (two) times daily as needed (shortness of breath).   folic acid (FOLVITE) 1 MG tablet Take 1 mg by mouth daily.   HYDROcodone-acetaminophen  (NORCO/VICODIN) 5-325 MG tablet Take 1 tablet by mouth every 6 (six) hours as needed for moderate pain or severe pain.   levothyroxine (SYNTHROID) 100 MCG tablet Take 100 mcg by mouth daily before breakfast.   losartan (COZAAR) 50 MG tablet Take 50 mg by mouth daily.   meloxicam (MOBIC) 15 MG tablet Take 15 mg by mouth daily.   methotrexate (RHEUMATREX) 2.5 MG tablet Take 5 mg by mouth 4 (four) times a week. Mon - Thurs   Multiple Vitamin (MULTIVITAMIN WITH MINERALS) TABS tablet Take 1 tablet by mouth daily.   pantoprazole (PROTONIX) 40 MG tablet Take 40 mg by mouth daily.   pregabalin (LYRICA) 50 MG capsule Take 50 mg by mouth daily as needed for pain.   REPATHA 140 MG/ML SOSY Inject 140 mg into the skin every 14 (fourteen) days.   traMADol (ULTRAM) 50 MG tablet Take 50 mg by mouth 2 (two) times daily as needed for moderate pain.   Radiology:  No results found.  Cardiac Studies:   Peripheral arteriogram 05/29/2010: Right SFA 6.0 x 120, 6.0 x 60, 6.0 x 40 mm ever flex self-expanding stents.  Peripheral arteriogram 06/20/2010: Left SFA atherectomy with diamondback 1.5 mm bur followed by balloon angioplasty.  ABI 05/11/2019:  This exam reveals mildly decreased perfusion of the right and left lower extremity, noted at the post tibial artery level (ABI 0.81).  Multiphasic waveform at the level of the ankles.  No significant change in ABI from 01/26/2013.  Carotid artery duplex  12/22/2019:  Stenosis in the right internal carotid artery (50-69%). Stenosis in the right external carotid artery (<50%).  Stenosis in the left internal carotid artery (50-69%). Stenosis in the left external carotid artery (<50%).  Antegrade right vertebral artery flow. Antegrade left vertebral artery flow.  Follow up in six months is appropriate if clinically indicated. Compared to 05/11/2019, right ICA stenosis mildly progressed.   CT angio neck 04/20/2021: 1. Severe stenosis at the right common carotid bifurcation  due to bulky calcified plaque with downstream underfilling/string sign. 2. 70% stenosis at the left vertebral origin (which is from the arch). The left vertebral artery is non dominant.  EKG  02/26/2022 - Sinus rhythm.  Normal axis.  Left atrial enlargement. Low voltage  06/20/2021: Sinus rhythm at a rate of 70 bpm.  Normal axis.  Left atrial enlargement.  Poor R wave progression, cannot exclude anteroseptal infarct old.  Low voltage complexes, consider pulmonary disease  pattern.  Compared EKG 06/20/2020, no significant change.  EKG 05/06/2019: Normal sinus rhythm at rate of 91 bpm, left atrial enlargement, normal axis.  Two PVCs.  Low-voltage Axis.  Assessment     ICD-10-CM   1. Stenosis of right carotid artery  I65.21 EKG 12-Lead          No orders of the defined types were placed in this encounter.   There are no discontinued medications.    Recommendations:   [name]  is a 76 y.o. African-American female with peripheral arterial disease with bilateral SFA angioplasty in 2012, essential hypertension, hyperlipidemia.   Stenosis of right carotid artery Continue to monitor with carotid u/s Patient follows with vascular surgery   Hypercholesteremia Continue lipitor    Primary hypertension Continue current cardiac medications. Encourage low-sodium diet, less than 2000 mg daily. Schedule echocardiogram   Follow up in 12 months, sooner if needed.    Floydene Flock, DO, [facility] 02/26/2022, 8:50 AM Office: [phone]

## 2022-03-26 ENCOUNTER — Encounter: Payer: Self-pay | Admitting: Surgery

## 2022-03-26 ENCOUNTER — Ambulatory Visit (HOSPITAL_COMMUNITY)
Admission: RE | Admit: 2022-03-26 | Discharge: 2022-03-26 | Disposition: A | Discharge status: Home / Self Care | Payer: Medicare HMO | Source: Ambulatory Visit | Attending: Surgery | Admitting: Surgery

## 2022-03-26 ENCOUNTER — Ambulatory Visit (INDEPENDENT_AMBULATORY_CARE_PROVIDER_SITE_OTHER): Payer: Medicare HMO | Admitting: Surgery

## 2022-03-26 VITALS — BP 122/70 | HR 72 | Temp 98.0°F | Resp 20 | Ht 67.5 in | Wt 146.0 lb

## 2022-03-26 DIAGNOSIS — I6523 Occlusion and stenosis of bilateral carotid arteries: Secondary | ICD-10-CM | POA: Insufficient documentation

## 2022-03-26 DIAGNOSIS — I6521 Occlusion and stenosis of right carotid artery: Secondary | ICD-10-CM | POA: Diagnosis not present

## 2022-03-26 NOTE — Progress Notes (Signed)
[facility] [place]  Patient name: [name] MRN: [id number] DOB: 1946-01-14 Sex: female   REASON FOR VISIT:    Follow up  HISOTRY OF PRESENT ILLNESS:    [name] is a 76 y.o. female who is status post right carotid endarterectomy with bovine pericardial patch angioplasty for asymptomatic right carotid stenosis on 09/01/2021.  Intraoperative findings included a 90% stenosis with a very calcified carotid bifurcation.  Exposure was challenging due to her neck fusion.  Her postoperative course was uncomplicated and she was discharged home on postoperative day #1.  He is back today for follow-up.  She has no complaints.  She denies any neurologic symptoms such as numbness or weakness in either extremity, slurred speech, or amaurosis fugax.  Patient has a history of peripheral vascular disease, status post bilateral superficial femoral artery angioplasty in 2012.  She is medically managed for hypertension.  She takes Repatha for hypercholesterolemia.  She is a former smoker.  PAST MEDICAL HISTORY:   Past Medical History:  Diagnosis Date   Arthritis    "all over" (07/25/2012)   Asthma    Carotid artery disease (HCC)    Environmental allergies    Fibromyalgia    GERD (gastroesophageal reflux disease)    Hyperlipidemia    Hypertension    Hypothyroidism    Peripheral vascular disease (HCC)    Post-menopausal    on HRT x 25 years     FAMILY HISTORY:   Family History  Problem Relation Age of Onset   Heart failure Mother    Hypertension Father    Hypertension Sister    Hypertension Brother    Hypertension Brother    Heart disease Brother     SOCIAL HISTORY:   Social History   Tobacco Use   Smoking status: Former    Packs/day: 0.25    Years: 30.00    Total pack years: 7.50    Types: Cigarettes    Quit date: 12/15/2020    Years since quitting: 1.2   Smokeless tobacco: Never  Substance Use Topics   Alcohol  use: Not Currently     ALLERGIES:   Allergies  Allergen Reactions   Naloxegol Other (See Comments)    Unknown reaction    Penicillin G     Other reaction(s): Unknown   Pine     Seasonal allergy   Codeine Itching     CURRENT MEDICATIONS:   Current Outpatient Medications  Medication Sig Dispense Refill   Adalimumab (HUMIRA) 40 MG/0.8ML PSKT Inject 40 mg into the skin every 14 (fourteen) days.     amitriptyline (ELAVIL) 150 MG tablet Take 150 mg by mouth at bedtime.     amLODipine (NORVASC) 10 MG tablet Take 10 mg by mouth daily.     atorvastatin (LIPITOR) 20 MG tablet Take 20 mg by mouth daily.     cholecalciferol (VITAMIN D3) 25 MCG (1000 UNIT) tablet Take 1,000 Units by mouth daily.     clopidogrel (PLAVIX) 75 MG tablet Take 75 mg by mouth daily.     estrogens, conjugated, (PREMARIN) 0.625 MG tablet Take 0.625 mg by mouth daily. Take daily for 21 days then do not take for 7 days.     Fluticasone-Salmeterol (ADVAIR) 100-50 MCG/DOSE AEPB Inhale 1 puff into the lungs 2 (two) times daily as needed (shortness of breath).     folic acid (FOLVITE) 1 MG tablet Take 1 mg by mouth daily.     HYDROcodone-acetaminophen (NORCO/VICODIN) 5-325 MG tablet  Take 1 tablet by mouth every 6 (six) hours as needed for moderate pain or severe pain. 16 tablet 0   levothyroxine (SYNTHROID) 100 MCG tablet Take 100 mcg by mouth daily before breakfast.     losartan (COZAAR) 50 MG tablet Take 50 mg by mouth daily.     meloxicam (MOBIC) 15 MG tablet Take 15 mg by mouth daily.     methotrexate (RHEUMATREX) 2.5 MG tablet Take 5 mg by mouth 4 (four) times a week. Mon - Thurs     Multiple Vitamin (MULTIVITAMIN WITH MINERALS) TABS tablet Take 1 tablet by mouth daily.     pantoprazole (PROTONIX) 40 MG tablet Take 40 mg by mouth daily.     pregabalin (LYRICA) 50 MG capsule Take 50 mg by mouth daily as needed for pain.     REPATHA 140 MG/ML SOSY Inject 140 mg into the skin every 14 (fourteen) days.     traMADol  (ULTRAM) 50 MG tablet Take 50 mg by mouth 2 (two) times daily as needed for moderate pain.     No current facility-administered medications for this visit.    REVIEW OF SYSTEMS:   [X]  denotes positive finding, [ ]  denotes negative finding Cardiac  Comments:  Chest pain or chest pressure:    Shortness of breath upon exertion:    Short of breath when lying flat:    Irregular heart rhythm:        Vascular    Pain in calf, thigh, or hip brought on by ambulation:    Pain in feet at night that wakes you up from your sleep:     Blood clot in your veins:    Leg swelling:         Pulmonary    Oxygen at home:    Productive cough:     Wheezing:         Neurologic    Sudden weakness in arms or legs:     Sudden numbness in arms or legs:     Sudden onset of difficulty speaking or slurred speech:    Temporary loss of vision in one eye:     Problems with dizziness:         Gastrointestinal    Blood in stool:     Vomited blood:         Genitourinary    Burning when urinating:     Blood in urine:        Psychiatric    Major depression:         Hematologic    Bleeding problems:    Problems with blood clotting too easily:        Skin    Rashes or ulcers:        Constitutional    Fever or chills:      PHYSICAL EXAM:   Vitals:   03/26/22 1048 03/26/22 1050  BP: 129/71 122/70  Pulse: 72   Resp: 20   Temp: 98 F (36.7 C)   SpO2: 96%   Weight: 146 lb (66.2 kg)   Height: 5' 7.5" (1.715 m)     GENERAL: The patient is a well-nourished female, in no acute distress. The vital signs are documented above. CARDIAC: There is a regular rate and rhythm.  VASCULAR: Carotid incision has healed nicely on the right PULMONARY: Non-labored respirations MUSCULOSKELETAL: There are no major deformities or cyanosis. NEUROLOGIC: No focal weakness or paresthesias are detected. SKIN: There are no ulcers or rashes noted. PSYCHIATRIC: The patient  has a normal affect.  STUDIES:   I have  reviewed her ultrasound studies with the following findings: Right Carotid: Velocities in the right ICA are consistent with a 1-39%  stenosis.   Left Carotid: Velocities in the left ICA are consistent with a 1-39%  stenosis.   Vertebrals: Bilateral vertebral arteries demonstrate antegrade flow.  Subclavians: Normal flow hemodynamics were seen in bilateral subclavian               arteries.   MEDICAL ISSUES:   Right carotid stenosis: Ultrasound today shows a widely patent right carotid endarterectomy.  There is no significant stenosis on either side.  She will continue with ultrasound surveillance in 1 year.    Charlena Cross, MD, FACS [facility] [phone] Pager [phone]

## 2022-03-30 DIAGNOSIS — L218 Other seborrheic dermatitis: Secondary | ICD-10-CM | POA: Diagnosis not present

## 2022-04-05 ENCOUNTER — Ambulatory Visit (INDEPENDENT_AMBULATORY_CARE_PROVIDER_SITE_OTHER): Payer: Medicare HMO

## 2022-04-05 ENCOUNTER — Encounter: Payer: Self-pay | Admitting: Orthopaedic Surgery

## 2022-04-05 ENCOUNTER — Ambulatory Visit (INDEPENDENT_AMBULATORY_CARE_PROVIDER_SITE_OTHER): Payer: Medicare HMO | Admitting: Orthopaedic Surgery

## 2022-04-05 DIAGNOSIS — M25552 Pain in left hip: Secondary | ICD-10-CM

## 2022-04-05 DIAGNOSIS — M1612 Unilateral primary osteoarthritis, left hip: Secondary | ICD-10-CM | POA: Diagnosis not present

## 2022-04-05 NOTE — Progress Notes (Signed)
Office Visit Note   Patient: [name]           Date of Birth: 07-13-1945           MRN: [id number] Visit Date: 04/05/2022              Requested by: Irena Reichmann, DO [address] [place],  [place] [address] PCP: Irena Reichmann, DO   Assessment & Plan: Visit Diagnoses:  1. Pain in left hip   2. Unilateral primary osteoarthritis, left hip     Plan: [name] has been complaining of left hip pain for approximately 2 months.  She is having pain along the lateral aspect of the left hip or groin and even to some extent her buttock.  She is not specifically having back pain.  X-rays demonstrate advanced osteoarthritis of her left hip.  She has minimal motion on exam and does walk with a limp.  Long discussion over 40 minutes or so regarding her problem.  She has tried over-the-counter medicines without much relief.  She also has some hydrocodone at home that she has used without much relief.  I have suggested she use a cane.  I would like to refer her to Dr. Magnus Ivan for consideration of hip replacement.  She agrees.  She might have some pain with relative to her lumbar spine but I think the majority is referable to her hip  Follow-Up Instructions: Return Refer to Dr. Magnus Ivan for consideration of left total hip replacement.   Orders:  Orders Placed This Encounter  Procedures   XR Lumbar Spine 2-3 Views   XR Pelvis 1-2 Views   Ambulatory referral to Orthopedic Surgery   No orders of the defined types were placed in this encounter.     Procedures: No procedures performed   Clinical Data: No additional findings.   Subjective: Chief Complaint  Patient presents with   Left Hip - Pain  Patient presents today for left hip pain. She said that it started a few months ago. No known injury. She has pain at right above the lateral aspect of her buttock. It wraps around and into her groin area. No pain down either leg. She said that it hurts constantly, regardless of  activity or position. She takes hydrocodone as needed.   HPI  Review of Systems   Objective: Vital Signs: There were no vitals taken for this visit.  Physical Exam Constitutional:      Appearance: She is well-developed.  Pulmonary:     Effort: Pulmonary effort is normal.  Skin:    General: Skin is warm and dry.  Neurological:     Mental Status: She is alert and oriented to person, place, and time.  Psychiatric:        Behavior: Behavior normal.     Ortho Exam awake alert and oriented x 3.  Comfortable sitting.  Straight leg raise negative.  Minimal motion of the left hip with internal/external rotation of about 5 degrees.  She did experience some crepitation.  No pain over the greater trochanter.  No percussible tenderness of her lumbar spine more pain in the buttock.  Leg lengths appear to be about symmetrical.  Motor exam intact.  +1 pulses  Specialty Comments:  No specialty comments available.  Imaging: XR Lumbar Spine 2-3 Views  Result Date: 04/05/2022 AP and lateral of the lumbar spine were obtained.  There is significant degenerative changes at L4-5 and L5-S1 with disc space narrowing and facet sclerosis.  No significant listhesis or scoliosis.  There is diffuse calcification of the abdominal aorta.  No evidence of a compression fracture  XR Pelvis 1-2 Views  Result Date: 04/05/2022 AP pelvis demonstrates advanced osteoarthritis of the left hip.  The joint space is obliterated with large subchondral cyst in the femoral head and to a lesser extent in the acetabulum.  Peripheral osteophytes were identified in the femoral head.  Bilateral femoral stents identified.  Osteoarthritis is also identified in the right hip to a lesser extent    PMFS History: Patient Active Problem List   Diagnosis Date Noted   Unilateral primary osteoarthritis, left hip 04/05/2022   Carotid artery stenosis 09/01/2021   Asymptomatic carotid artery stenosis without infarction, right 09/01/2021    Low back pain 12/28/2020   Bilateral primary osteoarthritis of knee 10/29/2019   Past Medical History:  Diagnosis Date   Arthritis    "all over" (07/25/2012)   Asthma    Carotid artery disease (HCC)    Environmental allergies    Fibromyalgia    GERD (gastroesophageal reflux disease)    Hyperlipidemia    Hypertension    Hypothyroidism    Peripheral vascular disease (HCC)    Post-menopausal    on HRT x 25 years    Family History  Problem Relation Age of Onset   Heart failure Mother    Hypertension Father    Hypertension Sister    Hypertension Brother    Hypertension Brother    Heart disease Brother     Past Surgical History:  Procedure Laterality Date   ABDOMINAL HYSTERECTOMY  ~ 1977   ANTERIOR CERVICAL DECOMPRESSION/DISCECTOMY FUSION 4 LEVELS N/A 07/23/2012   Procedure: ANTERIOR CERVICAL DECOMPRESSION/DISCECTOMY FUSION 3 LEVELS;  Surgeon: Emilee Hero, MD;  Location: [facility] OR;  Service: Orthopedics;  Laterality: N/A;  Anterior cervical decompression fusion, cervical 3-4, cervical 4-5, cervical 5-6 with instrumentation and allograft.   ENDARTERECTOMY Right 09/01/2021   Procedure: RIGHT CAROTID ENDARTERECTOMY;  Surgeon: Nada Libman, MD;  Location: [facility] OR;  Service: Vascular;  Laterality: Right;   FEMORAL ARTERY STENT Bilateral 2011   LUMBAR LAMINECTOMY/DECOMPRESSION MICRODISCECTOMY N/A 03/22/2021   Procedure: LUMBAR 4 - LUMBAR 5 DECOMPRESSION;  Surgeon: Estill Bamberg, MD;  Location: [facility] OR;  Service: Orthopedics;  Laterality: N/A;   PATCH ANGIOPLASTY Right 09/01/2021   Procedure: PATCH ANGIOPLASTY OF RIGHT CAROTID ARTERY USING XENOSURE BOVINE PERICARDIUM PATCH;  Surgeon: Nada Libman, MD;  Location: [facility] OR;  Service: Vascular;  Laterality: Right;   POSTERIOR CERVICAL FUSION/FORAMINOTOMY N/A 07/24/2012   Procedure: POSTERIOR CERVICAL FUSION/FORAMINOTOMY LEVEL 5;  Surgeon: Emilee Hero, MD;  Location: [facility] OR;  Service: Orthopedics;  Laterality: N/A;  Posterior  cervical decompression, cervical 6-7, C7-T1, T1-T2. Cervical fusion cervical 3-4, cervical 4-5, cervical 5-6, cervical 6-7, cervical 7-T1 with allograft, local autograft.   POSTERIOR FUSION CERVICAL SPINE  07/24/2012   C3-T2 posterior fusion with instrumentation and allograft, C6-T2 decompression)    TONSILLECTOMY  1960's?   Social History   Occupational History   Not on file  Tobacco Use   Smoking status: Former    Packs/day: 0.25    Years: 30.00    Total pack years: 7.50    Types: Cigarettes    Quit date: 12/15/2020    Years since quitting: 1.3   Smokeless tobacco: Never  Vaping Use   Vaping Use: Never used  Substance and Sexual Activity   Alcohol use: Not Currently   Drug use: No   Sexual activity: Yes

## 2022-04-12 DIAGNOSIS — E559 Vitamin D deficiency, unspecified: Secondary | ICD-10-CM | POA: Diagnosis not present

## 2022-04-12 DIAGNOSIS — K219 Gastro-esophageal reflux disease without esophagitis: Secondary | ICD-10-CM | POA: Diagnosis not present

## 2022-04-12 DIAGNOSIS — E039 Hypothyroidism, unspecified: Secondary | ICD-10-CM | POA: Diagnosis not present

## 2022-04-12 DIAGNOSIS — I739 Peripheral vascular disease, unspecified: Secondary | ICD-10-CM | POA: Diagnosis not present

## 2022-04-12 DIAGNOSIS — R7303 Prediabetes: Secondary | ICD-10-CM | POA: Diagnosis not present

## 2022-04-12 DIAGNOSIS — E7800 Pure hypercholesterolemia, unspecified: Secondary | ICD-10-CM | POA: Diagnosis not present

## 2022-04-12 DIAGNOSIS — Z9889 Other specified postprocedural states: Secondary | ICD-10-CM | POA: Diagnosis not present

## 2022-04-12 DIAGNOSIS — M1990 Unspecified osteoarthritis, unspecified site: Secondary | ICD-10-CM | POA: Diagnosis not present

## 2022-04-12 DIAGNOSIS — L4050 Arthropathic psoriasis, unspecified: Secondary | ICD-10-CM | POA: Diagnosis not present

## 2022-04-30 ENCOUNTER — Other Ambulatory Visit: Payer: PRIVATE HEALTH INSURANCE

## 2022-04-30 ENCOUNTER — Ambulatory Visit (INDEPENDENT_AMBULATORY_CARE_PROVIDER_SITE_OTHER): Payer: Medicare HMO | Admitting: Orthopaedic Surgery

## 2022-04-30 DIAGNOSIS — M25552 Pain in left hip: Secondary | ICD-10-CM | POA: Diagnosis not present

## 2022-04-30 DIAGNOSIS — M1612 Unilateral primary osteoarthritis, left hip: Secondary | ICD-10-CM

## 2022-04-30 NOTE — Progress Notes (Signed)
The patient is an established patient of the practice and has been sent to me from Dr. Durward Fortes to evaluate and treat severe arthritis of both hips with the left much worse than the right.  She is 77 years old.  She has significant rheumatoid disease and is on Plavix.  She has tried and failed conservative treatment for 6 months to well over a year as a relates to her hip pain.  It has gotten to where her hip pain is detrimentally affecting her mobility, her quality of life and her actives daily living.  Her pain is daily and it is 10 out of 10.  She wishes to proceed with a left hip replacement as soon as we can schedule her.  I did review her x-rays with her both hips.  Her left hip shows complete loss of joint space with no space remaining at all.  There are sclerotic changes as well as osteophytes.  The right hip has quite considerable arthritic changes as well but the joint space is slightly still present.  She has severe stiffness with both hips on rotation and significant pain in the groin bilaterally with the left worse than the right.  We talked in length in detail about hip replacement surgery.  I discussed the risks and benefits of the surgery.  I gave her handout about hip replacement surgery and showed her hip replacement model.  We went over her x-rays in detail.  We talked about what to expect from an intraoperative and postoperative course.  She is on Plavix.  Will have her stop that for 1 week prior to surgery.  We will work on getting surgery scheduled.  All questions and concerns were addressed and answered.

## 2022-05-01 DIAGNOSIS — R7303 Prediabetes: Secondary | ICD-10-CM | POA: Diagnosis not present

## 2022-05-01 DIAGNOSIS — K219 Gastro-esophageal reflux disease without esophagitis: Secondary | ICD-10-CM | POA: Diagnosis not present

## 2022-05-01 DIAGNOSIS — E7800 Pure hypercholesterolemia, unspecified: Secondary | ICD-10-CM | POA: Diagnosis not present

## 2022-05-01 DIAGNOSIS — L4050 Arthropathic psoriasis, unspecified: Secondary | ICD-10-CM | POA: Diagnosis not present

## 2022-05-01 DIAGNOSIS — M4800 Spinal stenosis, site unspecified: Secondary | ICD-10-CM | POA: Diagnosis not present

## 2022-05-01 DIAGNOSIS — M797 Fibromyalgia: Secondary | ICD-10-CM | POA: Diagnosis not present

## 2022-05-01 DIAGNOSIS — M25561 Pain in right knee: Secondary | ICD-10-CM | POA: Diagnosis not present

## 2022-05-01 DIAGNOSIS — M1990 Unspecified osteoarthritis, unspecified site: Secondary | ICD-10-CM | POA: Diagnosis not present

## 2022-05-01 DIAGNOSIS — E039 Hypothyroidism, unspecified: Secondary | ICD-10-CM | POA: Diagnosis not present

## 2022-05-01 DIAGNOSIS — Z79899 Other long term (current) drug therapy: Secondary | ICD-10-CM | POA: Diagnosis not present

## 2022-05-07 ENCOUNTER — Ambulatory Visit: Payer: Medicare HMO

## 2022-05-07 DIAGNOSIS — I6521 Occlusion and stenosis of right carotid artery: Secondary | ICD-10-CM | POA: Diagnosis not present

## 2022-05-07 DIAGNOSIS — I10 Essential (primary) hypertension: Secondary | ICD-10-CM | POA: Diagnosis not present

## 2022-05-07 DIAGNOSIS — E7800 Pure hypercholesterolemia, unspecified: Secondary | ICD-10-CM | POA: Diagnosis not present

## 2022-05-21 DIAGNOSIS — Z9109 Other allergy status, other than to drugs and biological substances: Secondary | ICD-10-CM | POA: Diagnosis not present

## 2022-05-29 ENCOUNTER — Other Ambulatory Visit: Payer: Self-pay

## 2022-05-30 DIAGNOSIS — M79644 Pain in right finger(s): Secondary | ICD-10-CM | POA: Diagnosis not present

## 2022-05-30 DIAGNOSIS — M7989 Other specified soft tissue disorders: Secondary | ICD-10-CM | POA: Diagnosis not present

## 2022-06-07 ENCOUNTER — Other Ambulatory Visit: Payer: Self-pay | Admitting: Physician Assistant

## 2022-06-07 DIAGNOSIS — Z01818 Encounter for other preprocedural examination: Secondary | ICD-10-CM

## 2022-06-11 ENCOUNTER — Encounter (HOSPITAL_COMMUNITY): Payer: Self-pay

## 2022-06-11 NOTE — Progress Notes (Signed)
PCP - Janie Morning, DO Cardiologist - [place] 02-26-22 epic Floydene Flock  PPM/ICD -  Device Orders -  Rep Notified -   Chest x-ray -  EKG - 02-27-23 Stress Test -  ECHO - 05-08-23 epic Cardiac Cath -   Sleep Study -  CPAP -   Fasting Blood Sugar -  Checks Blood Sugar _____ times a day  Blood Thinner Instructions:Plavix stop 1 week prior Aspirin Instructions:  ERAS Protcol -y PRE-SURGERY Ensure     COVID vaccine -  Activity-- Anesthesia review: HTN  Patient denies shortness of breath, fever, cough and chest pain at PAT appointment   All instructions explained to the patient, with a verbal understanding of the material. Patient agrees to go over the instructions while at home for a better understanding. Patient also instructed to self quarantine after being tested for COVID-19. The opportunity to ask questions was provided.

## 2022-06-11 NOTE — Patient Instructions (Signed)
SURGICAL WAITING ROOM VISITATION  Patients having surgery or a procedure may have no more than 2 support people in the waiting area - these visitors may rotate.    Children under the age of 47 must have an adult with them who is not the patient.  Due to an increase in RSV and influenza rates and associated hospitalizations, children ages 61 and under may not visit patients in [place].  If the patient needs to stay at the hospital during part of their recovery, the visitor guidelines for inpatient rooms apply. Pre-op nurse will coordinate an appropriate time for 1 support person to accompany patient in pre-op.  This support person may not rotate.    Please refer to the [facility] website for the visitor guidelines for Inpatients (after your surgery is over and you are in a regular room).       Your procedure is scheduled on: 06-22-22   Report to [facility] Main Entrance    Report to admitting at   1015   AM   Call this number if you have problems the morning of surgery [phone]   Do not eat food :After Midnight.   After Midnight you may have the following liquids until __0945____ AM DAY OF SURGERY  Water Non-Citrus Juices (without pulp, NO RED-Apple, White grape, White cranberry) Black Coffee (NO MILK/CREAM OR CREAMERS, sugar ok)  Clear Tea (NO MILK/CREAM OR CREAMERS, sugar ok) regular and decaf                             Plain Jell-O (NO RED)                                           Fruit ices (not with fruit pulp, NO RED)                                     Popsicles (NO RED)                                                               Sports drinks like Gatorade (NO RED)                   The day of surgery:  Drink ONE (1) Pre-Surgery Clear Ensure  at   0930  AM the morning of surgery. Drink in one sitting. Do not sip.  This drink was given to you during your hospital  pre-op appointment visit. Nothing else to drink after completing the   Pre-Surgery Clear Ensure by 0945          If you have questions, please contact your surgeon's office.   FOLLOW ANY ADDITIONAL PRE OP INSTRUCTIONS YOU RECEIVED FROM YOUR SURGEON'S OFFICE!!!     Oral Hygiene is also important to reduce your risk of infection.                                    Remember - BRUSH YOUR  TEETH THE MORNING OF SURGERY WITH YOUR REGULAR TOOTHPASTE  DENTURES WILL BE REMOVED PRIOR TO SURGERY PLEASE DO NOT APPLY "Poly grip" OR ADHESIVES!!!   These are anesthesia recommendations for holding your anticoagulants.  Please contact your prescribing physician to confirm IF it is safe to hold your anticoagulants for this length of time.   Eliquis Apixaban   72 hours   Xarelto Rivaroxaban   72 hours  Plavix Clopidogrel   120 hours  Pletal Cilostazol   120 hours     Take these medicines the morning of surgery with A SIP OF WATER: pregabalin, pantoprazole, tramadol of hydrocodone if needed, inhaler if needed, atorvastatin, amlodipine, levothyroxine  Bring CPAP mask and tubing day of surgery.                              You may not have any metal on your body including hair pins, jewelry, and body piercing             Do not wear make-up, lotions, powders, perfumes/cologne, or deodorant  Do not wear nail polish including gel and S&S, artificial/acrylic nails, or any other type of covering on natural nails including finger and toenails. If you have artificial nails, gel coating, etc. that needs to be removed by a nail salon please have this removed prior to surgery or surgery may need to be canceled/ delayed if the surgeon/ anesthesia feels like they are unable to be safely monitored.   Do not shave  48 hours prior to surgery.             Do not bring valuables to the hospital. [place].   Contacts, glasses, dentures or bridgework may not be worn into surgery.   Bring small overnight bag day of surgery.   DO NOT  [place]. PHARMACY WILL DISPENSE MEDICATIONS LISTED ON YOUR MEDICATION LIST TO YOU DURING YOUR ADMISSION [place]!     Special Instructions: Bring a copy of your healthcare power of attorney and living will documents the day of surgery if you haven't scanned them before.              Please read over the following fact sheets you were given: IF [place] [phone]   If you received a COVID test during your pre-op visit  it is requested that you wear a mask when out in public, stay away from anyone that may not be feeling well and notify your surgeon if you develop symptoms. If you test positive for Covid or have been in contact with anyone that has tested positive in the last 10 days please notify you surgeon.    [place] - Preparing for Surgery Before surgery, you can play an important role.  Because skin is not sterile, your skin needs to be as free of germs as possible.  You can reduce the number of germs on your skin by washing with CHG (chlorahexidine gluconate) soap before surgery.  CHG is an antiseptic cleaner which kills germs and bonds with the skin to continue killing germs even after washing. Please DO NOT use if you have an allergy to CHG or antibacterial soaps.  If your skin becomes reddened/irritated stop using the CHG and inform your nurse when you arrive  at Short Stay. Do not shave (including legs and underarms) for at least 48 hours prior to the first CHG shower.  You may shave your face/neck. Please follow these instructions carefully:  1.  Shower with CHG Soap the night before surgery and the  morning of Surgery.  2.  If you choose to wash your hair, wash your hair first as usual with your  normal  shampoo.  3.  After you shampoo, rinse your hair and body thoroughly to remove the  shampoo.                           4.  Use CHG as you would any other liquid soap.  You can apply chg  directly  to the skin and wash                       Gently with a scrungie or clean washcloth.  5.  Apply the CHG Soap to your body ONLY FROM THE NECK DOWN.   Do not use on face/ open                           Wound or open sores. Avoid contact with eyes, ears mouth and genitals (private parts).                       Wash face,  Genitals (private parts) with your normal soap.             6.  Wash thoroughly, paying special attention to the area where your surgery  will be performed.  7.  Thoroughly rinse your body with warm water from the neck down.  8.  DO NOT shower/wash with your normal soap after using and rinsing off  the CHG Soap.                9.  Pat yourself dry with a clean towel.            10.  Wear clean pajamas.            11.  Place clean sheets on your bed the night of your first shower and do not  sleep with pets. Day of Surgery : Do not apply any lotions/deodorants the morning of surgery.  Please wear clean clothes to the hospital/surgery center.  FAILURE TO FOLLOW THESE INSTRUCTIONS MAY RESULT IN THE CANCELLATION OF YOUR SURGERY PATIENT SIGNATURE_________________________________  NURSE SIGNATURE__________________________________  ________________________________________________________________________  [name]  An incentive spirometer is a tool that can help keep your lungs clear and active. This tool measures how well you are filling your lungs with each breath. Taking long deep breaths may help reverse or decrease the chance of developing breathing (pulmonary) problems (especially infection) following: A long period of time when you are unable to move or be active. BEFORE THE PROCEDURE  If the spirometer includes an indicator to show your best effort, your nurse or respiratory therapist will set it to a desired goal. If possible, sit up straight or lean slightly forward. Try not to slouch. Hold the incentive spirometer in an upright  position. INSTRUCTIONS FOR USE  Sit on the edge of your bed if possible, or sit up as far as you can in bed or on a chair. Hold the incentive spirometer in an upright position. Breathe out normally. Place the mouthpiece in your mouth and seal your  lips tightly around it. Breathe in slowly and as deeply as possible, raising the piston or the ball toward the top of the column. Hold your breath for 3-5 seconds or for as long as possible. Allow the piston or ball to fall to the bottom of the column. Remove the mouthpiece from your mouth and breathe out normally. Rest for a few seconds and repeat Steps 1 through 7 at least 10 times every 1-2 hours when you are awake. Take your time and take a few normal breaths between deep breaths. The spirometer may include an indicator to show your best effort. Use the indicator as a goal to work toward during each repetition. After each set of 10 deep breaths, practice coughing to be sure your lungs are clear. If you have an incision (the cut made at the time of surgery), support your incision when coughing by placing a pillow or rolled up towels firmly against it. Once you are able to get out of bed, walk around indoors and cough well. You may stop using the incentive spirometer when instructed by your caregiver.  RISKS AND COMPLICATIONS Take your time so you do not get dizzy or light-headed. If you are in pain, you may need to take or ask for pain medication before doing incentive spirometry. It is harder to take a deep breath if you are having pain. AFTER USE Rest and breathe slowly and easily. It can be helpful to keep track of a log of your progress. Your caregiver can provide you with a simple table to help with this. If you are using the spirometer at home, follow these instructions: [place] IF:  You are having difficultly using the spirometer. You have trouble using the spirometer as often as instructed. Your pain medication is not giving  enough relief while using the spirometer. You develop fever of 100.5 F (38.1 C) or higher. SEEK IMMEDIATE MEDICAL CARE IF:  You cough up bloody sputum that had not been present before. You develop fever of 102 F (38.9 C) or greater. You develop worsening pain at or near the incision site. MAKE SURE YOU:  Understand these instructions. Will watch your condition. Will get help right away if you are not doing well or get worse. Document Released: 08/13/2006 Document Revised: 06/25/2011 Document Reviewed: 10/14/2006 ExitCare Patient Information 2014 [employer], [place].   ________________________________________________________________________ WHAT IS A BLOOD TRANSFUSION? Blood Transfusion Information  A transfusion is the replacement of blood or some of its parts. Blood is made up of multiple cells which provide different functions. Red blood cells carry oxygen and are used for blood loss replacement. White blood cells fight against infection. Platelets control bleeding. Plasma helps clot blood. Other blood products are available for specialized needs, such as hemophilia or other clotting disorders. BEFORE THE TRANSFUSION  Who gives blood for transfusions?  Healthy volunteers who are fully evaluated to make sure their blood is safe. This is blood bank blood. Transfusion therapy is the safest it has ever been in the practice of medicine. Before blood is taken from a donor, a complete history is taken to make sure that person has no history of diseases nor engages in risky social behavior (examples are intravenous drug use or sexual activity with multiple partners). The donor's travel history is screened to minimize risk of transmitting infections, such as malaria. The donated blood is tested for signs of infectious diseases, such as HIV and hepatitis. The blood is then tested to be sure it is compatible with  you in order to minimize the chance of a transfusion reaction. If you or a relative  donates blood, this is often done in anticipation of surgery and is not appropriate for emergency situations. It takes many days to process the donated blood. RISKS AND COMPLICATIONS Although transfusion therapy is very safe and saves many lives, the main dangers of transfusion include:  Getting an infectious disease. Developing a transfusion reaction. This is an allergic reaction to something in the blood you were given. Every precaution is taken to prevent this. The decision to have a blood transfusion has been considered carefully by your caregiver before blood is given. Blood is not given unless the benefits outweigh the risks. AFTER THE TRANSFUSION Right after receiving a blood transfusion, you will usually feel much better and more energetic. This is especially true if your red blood cells have gotten low (anemic). The transfusion raises the level of the red blood cells which carry oxygen, and this usually causes an energy increase. The nurse administering the transfusion will monitor you carefully for complications. HOME CARE INSTRUCTIONS  No special instructions are needed after a transfusion. You may find your energy is better. Speak with your caregiver about any limitations on activity for underlying diseases you may have. SEEK MEDICAL CARE IF:  Your condition is not improving after your transfusion. You develop redness or irritation at the intravenous (IV) site. SEEK IMMEDIATE MEDICAL CARE IF:  Any of the following symptoms occur over the next 12 hours: Shaking chills. You have a temperature by mouth above 102 F (38.9 C), not controlled by medicine. Chest, back, or muscle pain. People around you feel you are not acting correctly or are confused. Shortness of breath or difficulty breathing. Dizziness and fainting. You get a rash or develop hives. You have a decrease in urine output. Your urine turns a dark color or changes to pink, red, or brown. Any of the following symptoms  occur over the next 10 days: You have a temperature by mouth above 102 F (38.9 C), not controlled by medicine. Shortness of breath. Weakness after normal activity. The white part of the eye turns yellow (jaundice). You have a decrease in the amount of urine or are urinating less often. Your urine turns a dark color or changes to pink, red, or brown. Document Released: 03/30/2000 Document Revised: 06/25/2011 Document Reviewed: 11/17/2007 [facility] Patient Information 2014 [place], [place].  _______________________________________________________________________

## 2022-06-12 ENCOUNTER — Other Ambulatory Visit: Payer: Self-pay

## 2022-06-12 ENCOUNTER — Encounter (HOSPITAL_COMMUNITY)
Admission: RE | Admit: 2022-06-12 | Discharge: 2022-06-12 | Disposition: A | Discharge status: Home / Self Care | Payer: Medicare HMO | Source: Ambulatory Visit | Attending: Orthopaedic Surgery | Admitting: Orthopaedic Surgery

## 2022-06-12 ENCOUNTER — Encounter (HOSPITAL_COMMUNITY): Payer: Self-pay

## 2022-06-12 DIAGNOSIS — Z7902 Long term (current) use of antithrombotics/antiplatelets: Secondary | ICD-10-CM | POA: Diagnosis not present

## 2022-06-12 DIAGNOSIS — M1612 Unilateral primary osteoarthritis, left hip: Secondary | ICD-10-CM | POA: Diagnosis not present

## 2022-06-12 DIAGNOSIS — I10 Essential (primary) hypertension: Secondary | ICD-10-CM | POA: Insufficient documentation

## 2022-06-12 DIAGNOSIS — Z01818 Encounter for other preprocedural examination: Secondary | ICD-10-CM

## 2022-06-12 DIAGNOSIS — Z79899 Other long term (current) drug therapy: Secondary | ICD-10-CM | POA: Insufficient documentation

## 2022-06-12 DIAGNOSIS — Z01812 Encounter for preprocedural laboratory examination: Secondary | ICD-10-CM | POA: Insufficient documentation

## 2022-06-12 DIAGNOSIS — I081 Rheumatic disorders of both mitral and tricuspid valves: Secondary | ICD-10-CM | POA: Insufficient documentation

## 2022-06-12 DIAGNOSIS — Z87891 Personal history of nicotine dependence: Secondary | ICD-10-CM | POA: Insufficient documentation

## 2022-06-12 DIAGNOSIS — I739 Peripheral vascular disease, unspecified: Secondary | ICD-10-CM | POA: Diagnosis not present

## 2022-06-12 LAB — CBC
HCT: 32.1 % — ABNORMAL LOW (ref 36.0–46.0)
Hemoglobin: 10.3 g/dL — ABNORMAL LOW (ref 12.0–15.0)
MCH: 30.7 pg (ref 26.0–34.0)
MCHC: 32.1 g/dL (ref 30.0–36.0)
MCV: 95.8 fL (ref 80.0–100.0)
Platelets: 570 10*3/uL — ABNORMAL HIGH (ref 150–400)
RBC: 3.35 MIL/uL — ABNORMAL LOW (ref 3.87–5.11)
RDW: 16.1 % — ABNORMAL HIGH (ref 11.5–15.5)
WBC: 6.5 10*3/uL (ref 4.0–10.5)
nRBC: 0 % (ref 0.0–0.2)

## 2022-06-12 LAB — COMPREHENSIVE METABOLIC PANEL
ALT: 9 U/L (ref 0–44)
AST: 14 U/L — ABNORMAL LOW (ref 15–41)
Albumin: 3.5 g/dL (ref 3.5–5.0)
Alkaline Phosphatase: 78 U/L (ref 38–126)
Anion gap: 7 (ref 5–15)
BUN: 25 mg/dL — ABNORMAL HIGH (ref 8–23)
CO2: 27 mmol/L (ref 22–32)
Calcium: 9.6 mg/dL (ref 8.9–10.3)
Chloride: 100 mmol/L (ref 98–111)
Creatinine, Ser: 1.04 mg/dL — ABNORMAL HIGH (ref 0.44–1.00)
GFR, Estimated: 56 mL/min — ABNORMAL LOW (ref >60)
Glucose, Bld: 110 mg/dL — ABNORMAL HIGH (ref 70–99)
Potassium: 3.5 mmol/L (ref 3.5–5.1)
Sodium: 134 mmol/L — ABNORMAL LOW (ref 135–145)
Total Bilirubin: 0.7 mg/dL (ref 0.3–1.2)
Total Protein: 7.3 g/dL (ref 6.5–8.1)

## 2022-06-12 LAB — SURGICAL PCR SCREEN
MRSA, PCR: NEGATIVE
Staphylococcus aureus: NEGATIVE

## 2022-06-13 NOTE — Anesthesia Preprocedure Evaluation (Addendum)
Anesthesia Evaluation  Patient identified by MRN, date of birth, ID band Patient awake    Reviewed: Allergy & Precautions, NPO status , Patient's Chart, lab work & pertinent test results  Airway Mallampati: II  TM Distance: >3 FB Neck ROM: Full    Dental no notable dental hx.    Pulmonary asthma , former smoker   Pulmonary exam normal        Cardiovascular hypertension, Pt. on medications + Peripheral Vascular Disease   Rhythm:Regular Rate:Normal  Echocardiogram 05/07/2022: Normal LV systolic function with visual EF 60-65%. Left ventricle cavity is normal in size. Mild concentric hypertrophy of the left ventricle. Normal global wall motion. Doppler evidence of grade II (pseudonormal) diastolic dysfunction, elevated LAP. Left atrial cavity is slightly dilated. Trileaflet aortic valve with no regurgitation. Mild aortic valve leaflet calcification. Native mitral valve.  Mild (Grade I) mitral regurgitation. Mild mitral valve leaflet calcification. Structurally normal tricuspid valve.  Mild tricuspid regurgitation. No evidence of pulmonary hypertension. No prior available for comparison.     Neuro/Psych negative neurological ROS  negative psych ROS   GI/Hepatic Neg liver ROS,GERD  ,,  Endo/Other  Hypothyroidism    Renal/GU negative Renal ROS  negative genitourinary   Musculoskeletal  (+) Arthritis , Osteoarthritis,  Fibromyalgia -  Abdominal Normal abdominal exam  (+)   Peds  Hematology   Anesthesia Other Findings   Reproductive/Obstetrics                             Anesthesia Physical Anesthesia Plan  ASA: 3  Anesthesia Plan: MAC and Spinal   Post-op Pain Management:    Induction: Intravenous  PONV Risk Score and Plan: 2 and Ondansetron, Dexamethasone and Treatment may vary due to age or medical condition  Airway Management Planned: Simple Face Mask, Natural Airway and Nasal  Cannula  Additional Equipment: None  Intra-op Plan:   Post-operative Plan:   Informed Consent: I have reviewed the patients History and Physical, chart, labs and discussed the procedure including the risks, benefits and alternatives for the proposed anesthesia with the patient or authorized representative who has indicated his/her understanding and acceptance.     Dental advisory given  Plan Discussed with:   Anesthesia Plan Comments: (See PAT note 06/12/2022  Lab Results      Component                Value               Date                      WBC                      6.5                 06/12/2022                HGB                      10.3 (L)            06/12/2022                HCT                      32.1 (L)            06/12/2022  MCV                      95.8                06/12/2022                PLT                      570 (H)             06/12/2022             Lab Results      Component                Value               Date                      NA                       134 (L)             06/12/2022                K                        3.5                 06/12/2022                CO2                      27                  06/12/2022                GLUCOSE                  110 (H)             06/12/2022                BUN                      25 (H)              06/12/2022                CREATININE               1.04 (H)            06/12/2022                CALCIUM                  9.6                 06/12/2022                GFRNONAA                 56 (L)              06/12/2022           )       Anesthesia Quick Evaluation

## 2022-06-13 NOTE — Progress Notes (Signed)
Anesthesia Chart Review   Case: [id number] Date/Time: 06/22/22 1230   Procedure: LEFT TOTAL HIP ARTHROPLASTY ANTERIOR APPROACH (Left: Hip)   Anesthesia type: Spinal   Pre-op diagnosis: OSTEOARTHRITIS / DEGENERATIVE JOINT DISEASE LEFT HIP   Location: [facility] ROOM 09 / [facility]   Surgeons: Mcarthur Rossetti, MD       DISCUSSION:77 y.o. former smoker with h/o HTN, carotid artery disease s/p right endarterectomy, peripheral arterial disease with bilateral SFA angioplasty in 2012, left hip djd scheduled for above procedure 06/22/2022 with Dr. Jean Rosenthal.   Pt last seen by vascular surgeon 03/26/22.  Stable at this visit, ultrasound with widely patent right carotid endarterectomy.  1 year follow up recommended.   Pt last seen by cardiology 02/26/2022, stable at this visit with 1 year follow up recommended.   Echo 05/07/22 with EF 60-65%, mild mitral regurgitation, mild tricuspid regurgitation.   Pt was advised to hold Plavix one week prior to surgery.   Anticipate pt can proceed with planned procedure barring acute status change.   VS: BP 100/60   Pulse 69   Temp 36.9 C (Oral)   Resp 16   Ht 5' 7.5" (1.715 m)   Wt 62.6 kg   SpO2 100%   BMI 21.29 kg/m   PROVIDERS: Janie Morning, DO is PCP    LABS: Labs reviewed: Acceptable for surgery. (all labs ordered are listed, but only abnormal results are displayed)  Labs Reviewed  CBC - Abnormal; Notable for the following components:      Result Value   RBC 3.35 (*)    Hemoglobin 10.3 (*)    HCT 32.1 (*)    RDW 16.1 (*)    Platelets 570 (*)    All other components within normal limits  COMPREHENSIVE METABOLIC PANEL - Abnormal; Notable for the following components:   Sodium 134 (*)    Glucose, Bld 110 (*)    BUN 25 (*)    Creatinine, Ser 1.04 (*)    AST 14 (*)    GFR, Estimated 56 (*)    All other components within normal limits  SURGICAL PCR SCREEN  TYPE AND SCREEN     IMAGES: VAS US Carotid 03/26/22 Summary:   Right Carotid: Velocities in the right ICA are consistent with a 1-39%  stenosis.   Left Carotid: Velocities in the left ICA are consistent with a 1-39%  stenosis.   Vertebrals: Bilateral vertebral arteries demonstrate antegrade flow.  Subclavians: Normal flow hemodynamics were seen in bilateral subclavian               arteries.   EKG:   CV: Echocardiogram 05/07/2022:  Normal LV systolic function with visual EF 60-65%. Left ventricle cavity  is normal in size. Mild concentric hypertrophy of the left ventricle.  Normal global wall motion. Doppler evidence of grade II (pseudonormal)  diastolic dysfunction, elevated LAP.  Left atrial cavity is slightly dilated.  Trileaflet aortic valve with no regurgitation. Mild aortic valve leaflet  calcification.  Native mitral valve.  Mild (Grade I) mitral regurgitation. Mild mitral  valve leaflet calcification.  Structurally normal tricuspid valve.  Mild tricuspid regurgitation. No  evidence of pulmonary hypertension.  No prior available for comparison.  Past Medical History:  Diagnosis Date   Arthritis    "all over" (07/25/2012)   Asthma    Carotid artery disease (HCC)    Environmental allergies    Fibromyalgia    GERD (gastroesophageal reflux disease)    Hyperlipidemia    Hypertension  Hypothyroidism    Peripheral vascular disease ([place])    Post-menopausal    on HRT x 25 years    Past Surgical History:  Procedure Laterality Date   ABDOMINAL HYSTERECTOMY  ~ 1977   ANTERIOR CERVICAL DECOMPRESSION/DISCECTOMY FUSION 4 LEVELS N/A 07/23/2012   Procedure: ANTERIOR CERVICAL DECOMPRESSION/DISCECTOMY FUSION 3 LEVELS;  Surgeon: Sinclair Ship, MD;  Location: [place];  Service: Orthopedics;  Laterality: N/A;  Anterior cervical decompression fusion, cervical 3-4, cervical 4-5, cervical 5-6 with instrumentation and allograft.   ENDARTERECTOMY Right 09/01/2021   Procedure: RIGHT CAROTID ENDARTERECTOMY;  Surgeon: Serafina Mitchell, MD;   [facility];  Service: Vascular;  Laterality: Right;   FEMORAL ARTERY STENT Bilateral 2011   LUMBAR LAMINECTOMY/DECOMPRESSION MICRODISCECTOMY N/A 03/22/2021   Procedure: LUMBAR 4 - LUMBAR 5 DECOMPRESSION;  Surgeon: Phylliss Bob, MD;  Location: [place];  Service: Orthopedics;  Laterality: N/A;   PATCH ANGIOPLASTY Right 09/01/2021   Procedure: PATCH ANGIOPLASTY OF RIGHT CAROTID ARTERY USING XENOSURE BOVINE PERICARDIUM PATCH;  Surgeon: Serafina Mitchell, MD;  Location: [place];  Service: Vascular;  Laterality: Right;   POSTERIOR CERVICAL FUSION/FORAMINOTOMY N/A 07/24/2012   Procedure: POSTERIOR CERVICAL FUSION/FORAMINOTOMY LEVEL 5;  Surgeon: Sinclair Ship, MD;  Location: [place];  Service: Orthopedics;  Laterality: N/A;  Posterior cervical decompression, cervical 6-7, C7-T1, T1-T2. Cervical fusion cervical 3-4, cervical 4-5, cervical 5-6, cervical 6-7, cervical 7-T1 with allograft, local autograft.   POSTERIOR FUSION CERVICAL SPINE  07/24/2012   C3-T2 posterior fusion with instrumentation and allograft, C6-T2 decompression)    TONSILLECTOMY  1960's?    MEDICATIONS:  amitriptyline (ELAVIL) 150 MG tablet   amLODipine (NORVASC) 10 MG tablet   atorvastatin (LIPITOR) 20 MG tablet   cholecalciferol (VITAMIN D3) 25 MCG (1000 UNIT) tablet   clopidogrel (PLAVIX) 75 MG tablet   estrogens, conjugated, (PREMARIN) 0.625 MG tablet   Fluticasone-Salmeterol (ADVAIR) 100-50 MCG/DOSE AEPB   folic acid (FOLVITE) 1 MG tablet   gentamicin cream (GARAMYCIN) 0.1 %   HYDROcodone-acetaminophen (NORCO) 10-325 MG tablet   HYDROcodone-acetaminophen (NORCO/VICODIN) 5-325 MG tablet   levothyroxine (SYNTHROID) 100 MCG tablet   losartan (COZAAR) 50 MG tablet   meloxicam (MOBIC) 15 MG tablet   methotrexate (RHEUMATREX) 2.5 MG tablet   Multiple Vitamin (MULTIVITAMIN WITH MINERALS) TABS tablet   nystatin (MYCOSTATIN) 100000 UNIT/ML suspension   pantoprazole (PROTONIX) 40 MG tablet   pregabalin (LYRICA) 50 MG capsule    REPATHA 140 MG/ML SOSY   traMADol (ULTRAM) 50 MG tablet   No current facility-administered medications for this encounter.

## 2022-06-14 DIAGNOSIS — E039 Hypothyroidism, unspecified: Secondary | ICD-10-CM | POA: Diagnosis not present

## 2022-06-14 DIAGNOSIS — R7303 Prediabetes: Secondary | ICD-10-CM | POA: Diagnosis not present

## 2022-06-14 DIAGNOSIS — I10 Essential (primary) hypertension: Secondary | ICD-10-CM | POA: Diagnosis not present

## 2022-06-14 DIAGNOSIS — E7800 Pure hypercholesterolemia, unspecified: Secondary | ICD-10-CM | POA: Diagnosis not present

## 2022-06-21 ENCOUNTER — Encounter (HOSPITAL_COMMUNITY): Payer: Self-pay | Admitting: Orthopaedic Surgery

## 2022-06-21 ENCOUNTER — Telehealth: Payer: Self-pay | Admitting: *Deleted

## 2022-06-21 NOTE — Care Plan (Signed)
[facility] RNCM call to patient to discuss her upcoming Left total hip arthroplasty on 06/22/22 with Dr. Ninfa Linden. She is an Ortho bundle patient through [facility] and is agreeable to case management. She lives with her spouse, who will be able to assist her after discharge. She will need a RW prior to d/c from hospital. Anticipate HHPT will be needed after short hospital stay. Referral made to [facility] after choice provided. Reviewed post op care instructions. Will continue to follow for needs.

## 2022-06-21 NOTE — H&P (Signed)
TOTAL HIP ADMISSION H&P  Patient is admitted for left total hip arthroplasty.  Subjective:  Chief Complaint: left hip pain  HPI: [name], 77 y.o. female, has a history of pain and functional disability in the left hip(s) due to arthritis and patient has failed non-surgical conservative treatments for greater than 12 weeks to include NSAID's and/or analgesics, corticosteriod injections, flexibility and strengthening excercises, use of assistive devices, and activity modification.  Onset of symptoms was gradual starting 2 years ago with gradually worsening course since that time.The patient noted no past surgery on the left hip(s).  Patient currently rates pain in the left hip at 10 out of 10 with activity. Patient has night pain, worsening of pain with activity and weight bearing, pain that interfers with activities of daily living, and pain with passive range of motion. Patient has evidence of subchondral cysts, subchondral sclerosis, periarticular osteophytes, and joint space narrowing by imaging studies. This condition presents safety issues increasing the risk of falls.  There is no current active infection.  Patient Active Problem List   Diagnosis Date Noted   Unilateral primary osteoarthritis, left hip 04/05/2022   Carotid artery stenosis 09/01/2021   Asymptomatic carotid artery stenosis without infarction, right 09/01/2021   Low back pain 12/28/2020   Bilateral primary osteoarthritis of knee 10/29/2019   Past Medical History:  Diagnosis Date   Arthritis    "all over" (07/25/2012)   Asthma    Carotid artery disease ([place])    Environmental allergies    Fibromyalgia    GERD (gastroesophageal reflux disease)    Hyperlipidemia    Hypertension    Hypothyroidism    Peripheral vascular disease ([place])    Post-menopausal    on HRT x 25 years    Past Surgical History:  Procedure Laterality Date   ABDOMINAL HYSTERECTOMY  ~ 1977   ANTERIOR CERVICAL DECOMPRESSION/DISCECTOMY FUSION 4  LEVELS N/A 07/23/2012   Procedure: ANTERIOR CERVICAL DECOMPRESSION/DISCECTOMY FUSION 3 LEVELS;  Surgeon: Sinclair Ship, MD;  Location: [facility];  Service: Orthopedics;  Laterality: N/A;  Anterior cervical decompression fusion, cervical 3-4, cervical 4-5, cervical 5-6 with instrumentation and allograft.   ENDARTERECTOMY Right 09/01/2021   Procedure: RIGHT CAROTID ENDARTERECTOMY;  Surgeon: Serafina Mitchell, MD;  Location: [facility] OR;  Service: Vascular;  Laterality: Right;   FEMORAL ARTERY STENT Bilateral 2011   LUMBAR LAMINECTOMY/DECOMPRESSION MICRODISCECTOMY N/A 03/22/2021   Procedure: LUMBAR 4 - LUMBAR 5 DECOMPRESSION;  Surgeon: Phylliss Bob, MD;  Location: [facility];  Service: Orthopedics;  Laterality: N/A;   PATCH ANGIOPLASTY Right 09/01/2021   Procedure: PATCH ANGIOPLASTY OF RIGHT CAROTID ARTERY USING XENOSURE BOVINE PERICARDIUM PATCH;  Surgeon: Serafina Mitchell, MD;  Location: [facility];  Service: Vascular;  Laterality: Right;   POSTERIOR CERVICAL FUSION/FORAMINOTOMY N/A 07/24/2012   Procedure: POSTERIOR CERVICAL FUSION/FORAMINOTOMY LEVEL 5;  Surgeon: Sinclair Ship, MD;  Location: [facility];  Service: Orthopedics;  Laterality: N/A;  Posterior cervical decompression, cervical 6-7, C7-T1, T1-T2. Cervical fusion cervical 3-4, cervical 4-5, cervical 5-6, cervical 6-7, cervical 7-T1 with allograft, local autograft.   POSTERIOR FUSION CERVICAL SPINE  07/24/2012   C3-T2 posterior fusion with instrumentation and allograft, C6-T2 decompression)    TONSILLECTOMY  1960's?    No current facility-administered medications for this encounter.   Current Outpatient Medications  Medication Sig Dispense Refill Last Dose   amitriptyline (ELAVIL) 150 MG tablet Take 150 mg by mouth at bedtime.      amLODipine (NORVASC) 10 MG tablet Take 10 mg by mouth daily.  atorvastatin (LIPITOR) 20 MG tablet Take 20 mg by mouth daily.      cholecalciferol (VITAMIN D3) 25 MCG (1000 UNIT) tablet Take 1,000 Units by mouth daily.       clopidogrel (PLAVIX) 75 MG tablet Take 75 mg by mouth daily.      estrogens, conjugated, (PREMARIN) 0.625 MG tablet Take 0.625 mg by mouth daily. Take daily for 21 days then do not take for 7 days.      Fluticasone-Salmeterol (ADVAIR) 100-50 MCG/DOSE AEPB Inhale 1 puff into the lungs 2 (two) times daily as needed (shortness of breath).      folic acid (FOLVITE) 1 MG tablet Take 1 mg by mouth daily.      gentamicin cream (GARAMYCIN) 0.1 % Apply 1 Application topically 3 (three) times daily.      HYDROcodone-acetaminophen (NORCO) 10-325 MG tablet Take 1 tablet by mouth every 6 (six) hours as needed for severe pain.      levothyroxine (SYNTHROID) 100 MCG tablet Take 100 mcg by mouth daily before breakfast.      losartan (COZAAR) 50 MG tablet Take 50 mg by mouth daily.      meloxicam (MOBIC) 15 MG tablet Take 15 mg by mouth daily.      methotrexate (RHEUMATREX) 2.5 MG tablet Take 5 mg by mouth 4 (four) times a week. Mon - Thurs      Multiple Vitamin (MULTIVITAMIN WITH MINERALS) TABS tablet Take 1 tablet by mouth daily.      nystatin (MYCOSTATIN) 100000 UNIT/ML suspension Take 5 mLs by mouth 3 (three) times daily.      pantoprazole (PROTONIX) 40 MG tablet Take 40 mg by mouth daily.      pregabalin (LYRICA) 50 MG capsule Take 50 mg by mouth 2 (two) times daily.      REPATHA 140 MG/ML SOSY Inject 140 mg into the skin every 14 (fourteen) days.      traMADol (ULTRAM) 50 MG tablet Take 50 mg by mouth 2 (two) times daily as needed for moderate pain.      HYDROcodone-acetaminophen (NORCO/VICODIN) 5-325 MG tablet Take 1 tablet by mouth every 6 (six) hours as needed for moderate pain or severe pain. (Patient not taking: Reported on 06/08/2022) 16 tablet 0 Not Taking   Allergies  Allergen Reactions   Penicillin G     Childhood Reaction    Pine     Seasonal allergy   Codeine Itching    Social History   Tobacco Use   Smoking status: Former    Packs/day: 0.25    Years: 30.00    Total pack years: 7.50     Types: Cigarettes    Quit date: 12/15/2020    Years since quitting: 1.5   Smokeless tobacco: Never  Substance Use Topics   Alcohol use: Not Currently    Family History  Problem Relation Age of Onset   Heart failure Mother    Hypertension Father    Hypertension Sister    Hypertension Brother    Hypertension Brother    Heart disease Brother      Review of Systems  Objective:  Physical Exam Vitals reviewed.  Constitutional:      Appearance: Normal appearance. She is normal weight.  HENT:     Head: Normocephalic and atraumatic.  Eyes:     Extraocular Movements: Extraocular movements intact.     Pupils: Pupils are equal, round, and reactive to light.  Cardiovascular:     Rate and Rhythm: Normal rate and regular rhythm.  Pulmonary:     Effort: Pulmonary effort is normal.     Breath sounds: Normal breath sounds.  Abdominal:     Palpations: Abdomen is soft.  Musculoskeletal:     Cervical back: Normal range of motion and neck supple.     Left hip: Tenderness and bony tenderness present. Decreased range of motion. Decreased strength.  Neurological:     Mental Status: She is alert and oriented to person, place, and time.  Psychiatric:        Behavior: Behavior normal.     Vital signs in last 24 hours:    Labs:   Estimated body mass index is 21.29 kg/m as calculated from the following:   Height as of 06/12/22: 5' 7.5" (1.715 m).   Weight as of 06/12/22: 62.6 kg.   Imaging Review Plain radiographs demonstrate severe degenerative joint disease of the left hip(s). The bone quality appears to be good for age and reported activity level.      Assessment/Plan:  End stage arthritis, left hip(s)  The patient history, physical examination, clinical judgement of the provider and imaging studies are consistent with end stage degenerative joint disease of the left hip(s) and total hip arthroplasty is deemed medically necessary. The treatment options including medical  management, injection therapy, arthroscopy and arthroplasty were discussed at length. The risks and benefits of total hip arthroplasty were presented and reviewed. The risks due to aseptic loosening, infection, stiffness, dislocation/subluxation,  thromboembolic complications and other imponderables were discussed.  The patient acknowledged the explanation, agreed to proceed with the plan and consent was signed. Patient is being admitted for inpatient treatment for surgery, pain control, PT, OT, prophylactic antibiotics, VTE prophylaxis, progressive ambulation and ADL's and discharge planning.The patient is planning to be discharged home with home health services

## 2022-06-21 NOTE — Telephone Encounter (Signed)
Ortho bundle pre-op call completed. 

## 2022-06-22 ENCOUNTER — Observation Stay (HOSPITAL_COMMUNITY)
Admission: RE | Admit: 2022-06-22 | Discharge: 2022-06-23 | Disposition: A | Discharge status: Home Health | Payer: Medicare HMO | Source: Ambulatory Visit | Attending: Orthopaedic Surgery | Admitting: Orthopaedic Surgery

## 2022-06-22 ENCOUNTER — Ambulatory Visit (HOSPITAL_COMMUNITY): Payer: Medicare HMO | Admitting: Anesthesiology

## 2022-06-22 ENCOUNTER — Encounter (HOSPITAL_COMMUNITY)
Admission: RE | Disposition: A | Discharge status: Home Health | Payer: Self-pay | Source: Ambulatory Visit | Attending: Orthopaedic Surgery

## 2022-06-22 ENCOUNTER — Other Ambulatory Visit: Payer: Self-pay

## 2022-06-22 ENCOUNTER — Observation Stay (HOSPITAL_COMMUNITY): Payer: Medicare HMO

## 2022-06-22 ENCOUNTER — Ambulatory Visit (HOSPITAL_COMMUNITY): Payer: Medicare HMO

## 2022-06-22 ENCOUNTER — Encounter (HOSPITAL_COMMUNITY): Payer: Self-pay | Admitting: Orthopaedic Surgery

## 2022-06-22 ENCOUNTER — Ambulatory Visit (HOSPITAL_COMMUNITY): Payer: Medicare HMO | Admitting: Physician Assistant

## 2022-06-22 DIAGNOSIS — Z7902 Long term (current) use of antithrombotics/antiplatelets: Secondary | ICD-10-CM | POA: Diagnosis not present

## 2022-06-22 DIAGNOSIS — Z79899 Other long term (current) drug therapy: Secondary | ICD-10-CM | POA: Diagnosis not present

## 2022-06-22 DIAGNOSIS — I10 Essential (primary) hypertension: Secondary | ICD-10-CM | POA: Insufficient documentation

## 2022-06-22 DIAGNOSIS — Z96642 Presence of left artificial hip joint: Secondary | ICD-10-CM

## 2022-06-22 DIAGNOSIS — J45909 Unspecified asthma, uncomplicated: Secondary | ICD-10-CM | POA: Insufficient documentation

## 2022-06-22 DIAGNOSIS — Z471 Aftercare following joint replacement surgery: Secondary | ICD-10-CM | POA: Diagnosis not present

## 2022-06-22 DIAGNOSIS — Z87891 Personal history of nicotine dependence: Secondary | ICD-10-CM

## 2022-06-22 DIAGNOSIS — M1612 Unilateral primary osteoarthritis, left hip: Secondary | ICD-10-CM | POA: Diagnosis not present

## 2022-06-22 DIAGNOSIS — E039 Hypothyroidism, unspecified: Secondary | ICD-10-CM | POA: Diagnosis not present

## 2022-06-22 HISTORY — PX: TOTAL HIP ARTHROPLASTY: SHX124

## 2022-06-22 LAB — TYPE AND SCREEN
ABO/RH(D): O POS
Antibody Screen: NEGATIVE

## 2022-06-22 SURGERY — ARTHROPLASTY, HIP, TOTAL, ANTERIOR APPROACH
Anesthesia: Monitor Anesthesia Care | Site: Hip | Laterality: Left

## 2022-06-22 MED ORDER — FENTANYL CITRATE (PF) 100 MCG/2ML IJ SOLN
INTRAMUSCULAR | Status: AC
  Filled 2022-06-22: qty 2

## 2022-06-22 MED ORDER — OXYCODONE HCL 5 MG PO TABS
5.0000 mg | ORAL_TABLET | ORAL | Status: DC | PRN
  Administered 2022-06-22 – 2022-06-23 (×2): 10 mg via ORAL
  Filled 2022-06-22 (×2): qty 2

## 2022-06-22 MED ORDER — ONDANSETRON HCL 4 MG PO TABS: 4.0000 mg | ORAL_TABLET | Freq: Four times a day (QID) | ORAL | Status: DC | PRN

## 2022-06-22 MED ORDER — STERILE WATER FOR IRRIGATION IR SOLN
Status: DC | PRN
  Administered 2022-06-22 (×2): 1000 mL

## 2022-06-22 MED ORDER — ONDANSETRON HCL 4 MG/2ML IJ SOLN: 4.0000 mg | Freq: Four times a day (QID) | INTRAMUSCULAR | Status: DC | PRN

## 2022-06-22 MED ORDER — ORAL CARE MOUTH RINSE: 15.0000 mL | Freq: Once | OROMUCOSAL | Status: AC

## 2022-06-22 MED ORDER — BUPIVACAINE IN DEXTROSE 0.75-8.25 % IT SOLN
INTRATHECAL | Status: DC | PRN
  Administered 2022-06-22: 1.6 mL via INTRATHECAL

## 2022-06-22 MED ORDER — EPHEDRINE 5 MG/ML INJ
INTRAVENOUS | Status: AC
  Filled 2022-06-22: qty 5

## 2022-06-22 MED ORDER — ACETAMINOPHEN 10 MG/ML IV SOLN
INTRAVENOUS | Status: AC
  Filled 2022-06-22: qty 100

## 2022-06-22 MED ORDER — PROPOFOL 1000 MG/100ML IV EMUL
INTRAVENOUS | Status: AC
  Filled 2022-06-22: qty 100

## 2022-06-22 MED ORDER — DOCUSATE SODIUM 100 MG PO CAPS
100.0000 mg | ORAL_CAPSULE | Freq: Two times a day (BID) | ORAL | Status: DC
  Administered 2022-06-22 – 2022-06-23 (×2): 100 mg via ORAL
  Filled 2022-06-22 (×2): qty 1

## 2022-06-22 MED ORDER — PANTOPRAZOLE SODIUM 40 MG PO TBEC
40.0000 mg | DELAYED_RELEASE_TABLET | Freq: Every day | ORAL | Status: DC
  Administered 2022-06-23: 40 mg via ORAL
  Filled 2022-06-22: qty 1

## 2022-06-22 MED ORDER — SODIUM CHLORIDE 0.9 % IR SOLN
Status: DC | PRN
  Administered 2022-06-22: 1000 mL

## 2022-06-22 MED ORDER — FLUTICASONE-SALMETEROL 100-50 MCG/ACT IN AEPB
1.0000 | INHALATION_SPRAY | Freq: Two times a day (BID) | RESPIRATORY_TRACT | Status: DC
  Administered 2022-06-23: 1 via RESPIRATORY_TRACT

## 2022-06-22 MED ORDER — DEXAMETHASONE SODIUM PHOSPHATE 10 MG/ML IJ SOLN
INTRAMUSCULAR | Status: DC | PRN
  Administered 2022-06-22: 4 mg via INTRAVENOUS

## 2022-06-22 MED ORDER — METOCLOPRAMIDE HCL 5 MG/ML IJ SOLN: 5.0000 mg | Freq: Three times a day (TID) | INTRAMUSCULAR | Status: DC | PRN

## 2022-06-22 MED ORDER — FENTANYL CITRATE PF 50 MCG/ML IJ SOSY
PREFILLED_SYRINGE | INTRAMUSCULAR | Status: AC
  Filled 2022-06-22: qty 1

## 2022-06-22 MED ORDER — PHENOL 1.4 % MT LIQD: 1.0000 | OROMUCOSAL | Status: DC | PRN

## 2022-06-22 MED ORDER — ALUM & MAG HYDROXIDE-SIMETH 200-200-20 MG/5ML PO SUSP: 30.0000 mL | ORAL | Status: DC | PRN

## 2022-06-22 MED ORDER — FENTANYL CITRATE PF 50 MCG/ML IJ SOSY
25.0000 ug | PREFILLED_SYRINGE | INTRAMUSCULAR | Status: DC | PRN
  Administered 2022-06-22: 50 ug via INTRAVENOUS

## 2022-06-22 MED ORDER — LEVOTHYROXINE SODIUM 100 MCG PO TABS
100.0000 ug | ORAL_TABLET | Freq: Every day | ORAL | Status: DC
  Administered 2022-06-23: 100 ug via ORAL
  Filled 2022-06-22: qty 1

## 2022-06-22 MED ORDER — AMLODIPINE BESYLATE 10 MG PO TABS
10.0000 mg | ORAL_TABLET | Freq: Every day | ORAL | Status: DC
  Administered 2022-06-23: 10 mg via ORAL
  Filled 2022-06-22: qty 1

## 2022-06-22 MED ORDER — HYDROMORPHONE HCL 1 MG/ML IJ SOLN: 0.5000 mg | INTRAMUSCULAR | Status: DC | PRN

## 2022-06-22 MED ORDER — MENTHOL 3 MG MT LOZG: 1.0000 | LOZENGE | OROMUCOSAL | Status: DC | PRN

## 2022-06-22 MED ORDER — ACETAMINOPHEN 325 MG PO TABS: 325.0000 mg | ORAL_TABLET | Freq: Four times a day (QID) | ORAL | Status: DC | PRN

## 2022-06-22 MED ORDER — AMITRIPTYLINE HCL 50 MG PO TABS
150.0000 mg | ORAL_TABLET | Freq: Every day | ORAL | Status: DC
  Administered 2022-06-22: 150 mg via ORAL
  Filled 2022-06-22: qty 3

## 2022-06-22 MED ORDER — PREGABALIN 50 MG PO CAPS
50.0000 mg | ORAL_CAPSULE | Freq: Two times a day (BID) | ORAL | Status: DC
  Administered 2022-06-22 – 2022-06-23 (×2): 50 mg via ORAL
  Filled 2022-06-22 (×2): qty 1

## 2022-06-22 MED ORDER — EPHEDRINE SULFATE-NACL 50-0.9 MG/10ML-% IV SOSY
PREFILLED_SYRINGE | INTRAVENOUS | Status: DC | PRN
  Administered 2022-06-22 (×2): 10 mg via INTRAVENOUS

## 2022-06-22 MED ORDER — CEFAZOLIN SODIUM-DEXTROSE 2-4 GM/100ML-% IV SOLN
2.0000 g | INTRAVENOUS | Status: AC
  Administered 2022-06-22: 2 g via INTRAVENOUS
  Filled 2022-06-22: qty 100

## 2022-06-22 MED ORDER — FOLIC ACID 1 MG PO TABS
1.0000 mg | ORAL_TABLET | Freq: Every day | ORAL | Status: DC
  Administered 2022-06-23: 1 mg via ORAL
  Filled 2022-06-22: qty 1

## 2022-06-22 MED ORDER — METHOCARBAMOL 500 MG PO TABS
500.0000 mg | ORAL_TABLET | Freq: Four times a day (QID) | ORAL | Status: DC | PRN
  Administered 2022-06-23: 500 mg via ORAL
  Filled 2022-06-22: qty 1

## 2022-06-22 MED ORDER — ONDANSETRON HCL 4 MG/2ML IJ SOLN
INTRAMUSCULAR | Status: DC | PRN
  Administered 2022-06-22: 4 mg via INTRAVENOUS

## 2022-06-22 MED ORDER — MIDAZOLAM HCL 2 MG/2ML IJ SOLN
INTRAMUSCULAR | Status: AC
  Filled 2022-06-22: qty 2

## 2022-06-22 MED ORDER — DIPHENHYDRAMINE HCL 12.5 MG/5ML PO ELIX: 12.5000 mg | ORAL_SOLUTION | ORAL | Status: DC | PRN

## 2022-06-22 MED ORDER — HYDROMORPHONE HCL 2 MG PO TABS
2.0000 mg | ORAL_TABLET | ORAL | Status: DC | PRN
  Administered 2022-06-22 – 2022-06-23 (×3): 2 mg via ORAL
  Filled 2022-06-22 (×3): qty 1

## 2022-06-22 MED ORDER — METOCLOPRAMIDE HCL 5 MG PO TABS: 5.0000 mg | ORAL_TABLET | Freq: Three times a day (TID) | ORAL | Status: DC | PRN

## 2022-06-22 MED ORDER — MIDAZOLAM HCL 5 MG/5ML IJ SOLN
INTRAMUSCULAR | Status: DC | PRN
  Administered 2022-06-22: 1 mg via INTRAVENOUS
  Administered 2022-06-22 (×2): .5 mg via INTRAVENOUS

## 2022-06-22 MED ORDER — 0.9 % SODIUM CHLORIDE (POUR BTL) OPTIME
TOPICAL | Status: DC | PRN
  Administered 2022-06-22: 1000 mL

## 2022-06-22 MED ORDER — METHOCARBAMOL 500 MG IVPB - SIMPLE MED: 500.0000 mg | Freq: Four times a day (QID) | INTRAVENOUS | Status: DC | PRN

## 2022-06-22 MED ORDER — LOSARTAN POTASSIUM 50 MG PO TABS
50.0000 mg | ORAL_TABLET | Freq: Every day | ORAL | Status: DC
  Administered 2022-06-23: 50 mg via ORAL
  Filled 2022-06-22 (×2): qty 1

## 2022-06-22 MED ORDER — METHOCARBAMOL 500 MG IVPB - SIMPLE MED
INTRAVENOUS | Status: AC
  Administered 2022-06-22: 500 mg via INTRAVENOUS
  Filled 2022-06-22: qty 55

## 2022-06-22 MED ORDER — FLUCONAZOLE 150 MG PO TABS
150.0000 mg | ORAL_TABLET | Freq: Once | ORAL | Status: DC
  Filled 2022-06-22: qty 1

## 2022-06-22 MED ORDER — ASPIRIN 81 MG PO CHEW
81.0000 mg | CHEWABLE_TABLET | Freq: Every day | ORAL | Status: DC
  Administered 2022-06-23: 81 mg via ORAL
  Filled 2022-06-22: qty 1

## 2022-06-22 MED ORDER — PROPOFOL 500 MG/50ML IV EMUL
INTRAVENOUS | Status: DC | PRN
  Administered 2022-06-22: 75 ug/kg/min via INTRAVENOUS

## 2022-06-22 MED ORDER — ATORVASTATIN CALCIUM 20 MG PO TABS
20.0000 mg | ORAL_TABLET | Freq: Every day | ORAL | Status: DC
  Administered 2022-06-23: 20 mg via ORAL
  Filled 2022-06-22 (×2): qty 1

## 2022-06-22 MED ORDER — OXYCODONE HCL 5 MG PO TABS
ORAL_TABLET | ORAL | Status: AC
  Administered 2022-06-22: 10 mg via ORAL
  Filled 2022-06-22: qty 2

## 2022-06-22 MED ORDER — SODIUM CHLORIDE 0.9 % IV SOLN: INTRAVENOUS | Status: DC

## 2022-06-22 MED ORDER — FENTANYL CITRATE (PF) 100 MCG/2ML IJ SOLN
INTRAMUSCULAR | Status: DC | PRN
  Administered 2022-06-22: 50 ug via INTRAVENOUS

## 2022-06-22 MED ORDER — CLOPIDOGREL BISULFATE 75 MG PO TABS
75.0000 mg | ORAL_TABLET | Freq: Every day | ORAL | Status: DC
  Administered 2022-06-23: 75 mg via ORAL
  Filled 2022-06-22: qty 1

## 2022-06-22 MED ORDER — POVIDONE-IODINE 10 % EX SWAB: 2.0000 | Freq: Once | CUTANEOUS | Status: DC

## 2022-06-22 MED ORDER — ACETAMINOPHEN 10 MG/ML IV SOLN
1000.0000 mg | Freq: Once | INTRAVENOUS | Status: DC | PRN
  Administered 2022-06-22: 1000 mg via INTRAVENOUS

## 2022-06-22 MED ORDER — CHLORHEXIDINE GLUCONATE 0.12 % MT SOLN
15.0000 mL | Freq: Once | OROMUCOSAL | Status: AC
  Administered 2022-06-22: 15 mL via OROMUCOSAL

## 2022-06-22 MED ORDER — VITAMIN D 25 MCG (1000 UNIT) PO TABS
1000.0000 [IU] | ORAL_TABLET | Freq: Every day | ORAL | Status: DC
  Administered 2022-06-23: 1000 [IU] via ORAL
  Filled 2022-06-22: qty 1

## 2022-06-22 MED ORDER — CEFAZOLIN SODIUM-DEXTROSE 1-4 GM/50ML-% IV SOLN
1.0000 g | Freq: Four times a day (QID) | INTRAVENOUS | Status: AC
  Administered 2022-06-22 – 2022-06-23 (×2): 1 g via INTRAVENOUS
  Filled 2022-06-22 (×2): qty 50

## 2022-06-22 MED ORDER — NYSTATIN 100000 UNIT/ML MT SUSP
5.0000 mL | Freq: Three times a day (TID) | OROMUCOSAL | Status: DC
  Administered 2022-06-22 – 2022-06-23 (×3): 500000 [IU] via ORAL
  Filled 2022-06-22 (×4): qty 5

## 2022-06-22 MED ORDER — PHENYLEPHRINE HCL-NACL 20-0.9 MG/250ML-% IV SOLN
INTRAVENOUS | Status: DC | PRN
  Administered 2022-06-22: 100 ug/min via INTRAVENOUS

## 2022-06-22 MED ORDER — TRANEXAMIC ACID-NACL 1000-0.7 MG/100ML-% IV SOLN
1000.0000 mg | INTRAVENOUS | Status: AC
  Administered 2022-06-22: 1000 mg via INTRAVENOUS
  Filled 2022-06-22: qty 100

## 2022-06-22 MED ORDER — LACTATED RINGERS IV SOLN: INTRAVENOUS | Status: DC

## 2022-06-22 SURGICAL SUPPLY — 43 items
APL SKNCLS STERI-STRIP NONHPOA (GAUZE/BANDAGES/DRESSINGS)
ARTICULEZE HEAD (Hips) ×1 IMPLANT
BAG COUNTER SPONGE SURGICOUNT (BAG) ×1 IMPLANT
BAG SPEC THK2 15X12 ZIP CLS (MISCELLANEOUS)
BAG SPNG CNTER NS LX DISP (BAG) ×1
BAG ZIPLOCK 12X15 (MISCELLANEOUS) IMPLANT
BENZOIN TINCTURE PRP APPL 2/3 (GAUZE/BANDAGES/DRESSINGS) IMPLANT
BLADE SAW SGTL 18X1.27X75 (BLADE) ×1 IMPLANT
COVER PERINEAL POST (MISCELLANEOUS) ×1 IMPLANT
COVER SURGICAL LIGHT HANDLE (MISCELLANEOUS) ×1 IMPLANT
DRAPE FOOT SWITCH (DRAPES) ×1 IMPLANT
DRAPE STERI IOBAN 125X83 (DRAPES) ×1 IMPLANT
DRAPE U-SHAPE 47X51 STRL (DRAPES) ×2 IMPLANT
DRSG AQUACEL AG ADV 3.5X10 (GAUZE/BANDAGES/DRESSINGS) ×1 IMPLANT
DURAPREP 26ML APPLICATOR (WOUND CARE) ×1 IMPLANT
ELECT REM PT RETURN 15FT ADLT (MISCELLANEOUS) ×1 IMPLANT
FEM STEM 12/14 TAPER SZ 4 HIP (Orthopedic Implant) ×1 IMPLANT
FEMORAL STEM 12/14 TPR SZ4 HIP (Orthopedic Implant) IMPLANT
GAUZE XEROFORM 1X8 LF (GAUZE/BANDAGES/DRESSINGS) ×1 IMPLANT
GLOVE BIO SURGEON STRL SZ7.5 (GLOVE) ×1 IMPLANT
GLOVE BIOGEL PI IND STRL 8 (GLOVE) ×2 IMPLANT
GLOVE ECLIPSE 8.0 STRL XLNG CF (GLOVE) ×1 IMPLANT
GOWN STRL REUS W/ TWL XL LVL3 (GOWN DISPOSABLE) ×2 IMPLANT
GOWN STRL REUS W/TWL XL LVL3 (GOWN DISPOSABLE) ×2
HANDPIECE INTERPULSE COAX TIP (DISPOSABLE) ×1
HEAD ARTICULEZE (Hips) IMPLANT
HOLDER FOLEY CATH W/STRAP (MISCELLANEOUS) ×1 IMPLANT
KIT TURNOVER KIT A (KITS) IMPLANT
LINER ACETAB NEUTRAL 36ID 520D (Liner) IMPLANT
PACK ANTERIOR HIP CUSTOM (KITS) ×1 IMPLANT
PIN SECTOR W/GRIP ACE CUP 52MM (Hips) IMPLANT
SET HNDPC FAN SPRY TIP SCT (DISPOSABLE) ×1 IMPLANT
STAPLER VISISTAT 35W (STAPLE) IMPLANT
STRIP CLOSURE SKIN 1/2X4 (GAUZE/BANDAGES/DRESSINGS) IMPLANT
SUT ETHIBOND NAB CT1 #1 30IN (SUTURE) ×1 IMPLANT
SUT ETHILON 2 0 PS N (SUTURE) IMPLANT
SUT MNCRL AB 4-0 PS2 18 (SUTURE) IMPLANT
SUT VIC AB 0 CT1 36 (SUTURE) ×1 IMPLANT
SUT VIC AB 1 CT1 36 (SUTURE) ×1 IMPLANT
SUT VIC AB 2-0 CT1 27 (SUTURE) ×2
SUT VIC AB 2-0 CT1 TAPERPNT 27 (SUTURE) ×2 IMPLANT
TRAY FOLEY MTR SLVR 16FR STAT (SET/KITS/TRAYS/PACK) IMPLANT
YANKAUER SUCT BULB TIP NO VENT (SUCTIONS) ×1 IMPLANT

## 2022-06-22 NOTE — TOC Transition Note (Signed)
Transition of Care [facility]) - CM/SW Discharge Note   Patient Details  Name: [name] MRN: [id number] Date of Birth: [birth year]  [facility] of [facility]) CM/SW Contact:  Lennart Pall, LCSW Phone Number: 06/22/2022, 3:41 PM   Clinical Narrative:    Met with pt and confirming need for RW and no DME agency preference.  Order placed with [facility] for delivery to room.  HHPT prearranged with [facility] via MD office.  No further TOC needs.   Final next level of care: [place] Barriers to Discharge: No Barriers Identified   Patient Goals and CMS Choice      Discharge Placement                         Discharge Plan and Services Additional resources added to the After Visit Summary for                  DME Arranged: [place] rolling DME Agency: [employer] Date DME Agency Contacted: 06/22/22 Time DME Agency Contacted: [phone] Representative spoke with at DME Agency: Erasmo Downer [place]: PT [place]: [place] Date [place]: 06/22/22 Time HH Agency Contacted: 47    Social Determinants of Health (SDOH) Interventions SDOH Screenings   Tobacco Use: Medium Risk (06/22/2022)     Readmission Risk Interventions     No data to display

## 2022-06-22 NOTE — Transfer of Care (Signed)
Immediate Anesthesia Transfer of Care Note  Patient: [name]  Procedure(s) Performed: Procedure(s): LEFT TOTAL HIP ARTHROPLASTY ANTERIOR APPROACH (Left)  Patient Location: PACU  Anesthesia Type:Spinal  Level of Consciousness:  sedated, patient cooperative and responds to stimulation  Airway & Oxygen Therapy:Patient Spontanous Breathing and Patient connected to face mask oxgen  Post-op Assessment:  Report given to PACU RN and Post -op Vital signs reviewed and stable  Post vital signs:  Reviewed and stable  Last Vitals:  Vitals:   06/22/22 0753 06/22/22 1143  BP: 118/70 (!) 80/55  Pulse: 66 (!) 58  Resp: 16 16  Temp: 36.8 C (!) 36.4 C  SpO2: [id number]    Complications: No apparent anesthesia complications

## 2022-06-22 NOTE — Anesthesia Postprocedure Evaluation (Signed)
Anesthesia Post Note  Patient: [name]  Procedure(s) Performed: LEFT TOTAL HIP ARTHROPLASTY ANTERIOR APPROACH (Left: Hip)     Patient location during evaluation: PACU Anesthesia Type: MAC and Spinal Level of consciousness: awake and alert Pain management: pain level controlled Vital Signs Assessment: post-procedure vital signs reviewed and stable Respiratory status: spontaneous breathing, nonlabored ventilation, respiratory function stable and patient connected to nasal cannula oxygen Cardiovascular status: stable and blood pressure returned to baseline Postop Assessment: no apparent nausea or vomiting Anesthetic complications: no   No notable events documented.  Last Vitals:  Vitals:   06/22/22 1315 06/22/22 1400  BP: 111/75 131/74  Pulse: (!) 58 61  Resp: 12 20  Temp:    SpO2: 94% 100%    Last Pain:  Vitals:   06/22/22 1352  TempSrc:   PainSc: 8                  [name] P [name]

## 2022-06-22 NOTE — Op Note (Signed)
Operative Note  Date of operation: 06/22/2022 Preoperative diagnosis: Left hip primary osteoarthritis Postoperative diagnosis: Same  Procedure: Left direct anterior total hip arthroplasty  Implants: Implant Name Type Inv. Item Serial No. Manufacturer Lot No. LRB No. Used Action  PIN SECTOR W/GRIP ACE CUP 52MM - [id number] Hips PIN SECTOR W/GRIP ACE CUP 52MM  [employer]PU[employer] [id number] Left 1 Implanted  LINER ACETAB NEUTRAL 36ID 520D - [id number] Liner LINER ACETAB NEUTRAL 36ID 520D  DEPUY ORTH[employer] [id number] Left 1 Implanted  ARTICULEZE HEAD - [id number] Hips ARTICULEZE HEAD  DEPUY ORTH[employer] [id number] Left 1 Implanted  FEM STEM 12/14 TAPER SZ 4 HIP - [id number] Orthopedic Implant FEM STEM 12/14 TAPER SZ 4 HIP  DEPUY ORTHOPA[employer] [id number] Left 1 Implanted   Surgeon: Lind Guest. Ninfa Linden, MD Assistant: Benita Stabile, PA-C  Anesthesia: Spinal EBL: 150 cc Antibiotics: 2 g IV Ancef Complications: None  Indications: The patient is a 77 year old female with well-documented severe arthritis involving her left hip that is now detrimentally affecting her mobility, her quality of life and her actives daily living.  Having tried and failed all forms of conservative treatment she wished to proceed with a left hip replacement and we agree with this as well based on her x-ray and clinical exam findings.  She understands the risk of acute blood loss anemia, nerve or vessel injury, fracture, infection, DVT, dislocation, implant failure, leg length differences and wound healing issues.  She understands our goals are decreased pain, improve mobility and improve quality of life.  Procedure description: After informed consent was obtained and the appropriate left hip was marked, the patient was brought to the operating room and set up on the stretcher where spinal anesthesia was obtained.  She was then laid in supine position on the stretcher and a Foley catheter was placed.  Traction boots were placed on  both her feet and she was placed supine on the Hana fracture table with a perineal post in place in both legs and inline skeletal traction devices but no traction applied.  We assessed both hips and pelvis radiographically.  Her left operative hip was then prepped and draped with DuraPrep and sterile drapes.  A timeout was called and she is advised correct patient correct the left hip.  We then made incision just inferior and posterior to the ASIS and carried this slightly bleak down the leg.  Dissection was carried down the tensor fascia lata muscle and the tensor fascia was then divided longitudinally to proceed with a directed approach the hip.  Circumflex vessels were identified and cauterized.  The hip capsule identified and opened up in L-type format finding a moderate joint effusion.  Cobra retractors were placed around the medial and lateral femoral neck and a femoral neck cut was made with an oscillating saw just proximal to the lesser trochanter and completed with an osteotome.  A corkscrew guide was placed in the femoral head and the femoral head was removed in its entirety and there was a wide area devoid of cartilage.  A bent Hohmann was then placed over the medial acetabular rim and remnants of the acetabular labrum and other debris were removed.  We then began reaming under direct visualization from a size 43 reamer and stepwise increments going up to a size 51 reamer with all reamers placed under direct visualization and the last reamer also placed under direct fluoroscopy in order to obtain the depth of reaming, the inclination and the anteversion.  The real DePuy Sectra GRIPTION acetabular pendant size 52  was then placed without difficulty followed by a 36+0 polythene liner.  Attention was then turned to the femur.  The left leg was externally rotated to 120 degrees, extended and abducted.  A Mueller retractor was placed medially and a Hohmann retractor was placed behind the greater trochanter.  A  box cutting osteotome was used in the femoral canal.  The lateral joint capsule was released.  Broaching was then initiated from a size 0 broach going up to a size 4 broach.  We then trialed a standard offset femoral neck and a 36+1.5 trial hip ball.  The left leg was brought over and up and with traction and internal rotation reduced in the pelvis.  We assessed it radiographically and we definitely needed more offset and leg length.  The hip was then dislocated and all trial components were removed.  We placed the real Actis femoral component from DePuy which was a size 4 but with high offset.  We then went with a 36+5 metal hip ball.  This reduced and acetabulum and we are pleased with leg length, offset, range of motion and stability assessed radiographically and mechanically.  The soft tissue was then irrigated with normal saline solution.  The joint capsule was closed interrupted #1 Ethibond suture followed by a #1 Vicryl to close the tensor fascia.  0 Vicryl was used to close the deep tissue and 2-0 Vicryl was used to close subcutaneous tissue.  The skin was closed with staples.  An Aquacel dressing was applied.  The patient was taken off of the Hana table and taken the recovery room in stable addition with all final counts being correct and no complications noted.  Benita Stabile, PA-C did assist in entire case from beginning to end and his assistance was medically necessary and crucial for soft tissue management and retraction, helping guide implant placement and a layered closure of the wound.

## 2022-06-22 NOTE — Interval H&P Note (Signed)
History and Physical Interval Note: The patient understands that she is here today for a left hip replacement to treat her severe left hip arthritis.  There has been no acute or interval change in her medical status.  See recent H&P.  The risks and benefits of surgery been explained in detail and informed consent is obtained.  The left operative hip has been marked.  06/22/2022 8:39 AM  [name]  has presented today for surgery, with the diagnosis of OSTEOARTHRITIS / [place].  The various methods of treatment have been discussed with the patient and family. After consideration of risks, benefits and other options for treatment, the patient has consented to  Procedure(s): LEFT TOTAL HIP ARTHROPLASTY ANTERIOR APPROACH (Left) as a surgical intervention.  The patient's history has been reviewed, patient examined, no change in status, stable for surgery.  I have reviewed the patient's chart and labs.  Questions were answered to the patient's satisfaction.     Mcarthur Rossetti

## 2022-06-22 NOTE — Evaluation (Signed)
Physical Therapy Evaluation Patient Details Name: [name] MRN: [id number] DOB: Nov 18, 1945 Today's Date: 06/22/2022  History of Present Illness  Pt is a 77 year old female s/p L THA on 06/22/22.  PMHx significant for but not limited to: fibromyalgia, asthma, PVD, CAD, HTN, HLD, multiple back surgeries  Clinical Impression  Pt is s/p THA resulting in the deficits listed below (see PT Problem List).  Pt will benefit from skilled PT to increase their independence and safety with mobility to allow discharge to the venue listed below.   Pt assisted with ambulating in hallway POD #0.  Pt reports burning pain at incision site so applied ice upon sitting in recliner.  Pt anticipates d/c home with spouse and will need RW.         Recommendations for follow up therapy are one component of a multi-disciplinary discharge planning process, led by the attending physician.  Recommendations may be updated based on patient status, additional functional criteria and insurance authorization.  Follow Up Recommendations Follow physician's recommendations for discharge plan and follow up therapies      Assistance Recommended at Discharge PRN  Patient can return home with the following  Help with stairs or ramp for entrance;Assistance with cooking/housework    Equipment Recommendations Rolling walker (2 wheels)  Recommendations for Other Services       Functional Status Assessment Patient has had a recent decline in their functional status and demonstrates the ability to make significant improvements in function in a reasonable and predictable amount of time.     Precautions / Restrictions Precautions Precautions: Fall Restrictions Weight Bearing Restrictions: No Other Position/Activity Restrictions: WBAT      Mobility  Bed Mobility Overal bed mobility: Needs Assistance Bed Mobility: Supine to Sit     Supine to sit: Min guard, HOB elevated     General bed mobility comments: cues for  technique    Transfers Overall transfer level: Needs assistance Equipment used: Rolling walker (2 wheels) Transfers: Sit to/from Stand Sit to Stand: Min guard           General transfer comment: verbal cues for UE and LE positioning    Ambulation/Gait Ambulation/Gait assistance: Min guard Gait Distance (Feet): 80 Feet Assistive device: Rolling walker (2 wheels) Gait Pattern/deviations: Step-to pattern, Step-through pattern, Decreased stance time - left, Antalgic       General Gait Details: verbal cues for sequence, step length, RW positioning  Stairs            Wheelchair Mobility    Modified Rankin (Stroke Patients Only)       Balance                                             Pertinent Vitals/Pain Pain Assessment Pain Assessment: 0-10 Pain Score: 5  Pain Location: left hip Pain Descriptors / Indicators: Burning Pain Intervention(s): Repositioned, Monitored during session, Ice applied    Home Living Family/patient expects to be discharged to:: Private residence Living Arrangements: Spouse/significant other   Type of Home: House Home Access: Level entry       Home Layout: One level Home Equipment: None      Prior Function Prior Level of Function : Independent/Modified Independent                     Hand Dominance  Extremity/Trunk Assessment        Lower Extremity Assessment Lower Extremity Assessment: LLE deficits/detail LLE Deficits / Details: anticipated post op hip weakness       Communication   Communication: No difficulties  Cognition Arousal/Alertness: Awake/alert Behavior During Therapy: WFL for tasks assessed/performed Overall Cognitive Status: Within Functional Limits for tasks assessed                                          General Comments      Exercises     Assessment/Plan    PT Assessment Patient needs continued PT services  PT Problem List Decreased  mobility;Decreased strength;Decreased range of motion;Pain;Decreased knowledge of use of DME       PT Treatment Interventions Stair training;Gait training;DME instruction;Therapeutic exercise;Functional mobility training;Therapeutic activities;Patient/family education    PT Goals (Current goals can be found in the Care Plan section)  Acute Rehab PT Goals PT Goal Formulation: With patient Time For Goal Achievement: 06/29/22 Potential to Achieve Goals: Good    Frequency 7X/week     Co-evaluation               AM-PAC PT "6 Clicks" Mobility  Outcome Measure Help needed turning from your back to your side while in a flat bed without using bedrails?: A Little Help needed moving from lying on your back to sitting on the side of a flat bed without using bedrails?: A Little Help needed moving to and from a bed to a chair (including a wheelchair)?: A Little Help needed standing up from a chair using your arms (e.g., wheelchair or bedside chair)?: A Little Help needed to walk in hospital room?: A Little Help needed climbing 3-5 steps with a railing? : A Little 6 Click Score: 18    End of Session Equipment Utilized During Treatment: Gait belt Activity Tolerance: Patient tolerated treatment well Patient left: in chair;with call bell/phone within reach;with chair alarm set Nurse Communication: Mobility status PT Visit Diagnosis: Difficulty in walking, not elsewhere classified (R26.2)    Time: [id number] PT Time Calculation (min) (ACUTE ONLY): 15 min   Charges:   PT Evaluation $PT Eval Low Complexity: 1 Low         [name] PT, DPT Physical Therapist Acute Rehabilitation Services Preferred contact method: Secure Chat Weekend Pager Only: [phone] Office: [phone]   [name] [place] 06/22/2022, 4:24 PM

## 2022-06-22 NOTE — Anesthesia Procedure Notes (Addendum)
Spinal  Patient location during procedure: OR Start time: 06/22/2022 10:20 AM End time: 06/22/2022 10:22 AM Staffing Performed: anesthesiologist  Anesthesiologist: Darral Dash, DO Performed by: Darral Dash, DO Authorized by: Darral Dash, DO   Preanesthetic Checklist Completed: patient identified, IV checked, site marked, risks and benefits discussed, surgical consent, monitors and equipment checked, pre-op evaluation and timeout performed Spinal Block Patient position: sitting Prep: DuraPrep Patient monitoring: heart rate, cardiac monitor, continuous pulse ox and blood pressure Approach: midline Location: L3-4 Injection technique: single-shot Needle Needle type: Pencan  Needle gauge: 24 G Needle length: 10 cm Assessment Events: CSF return Additional Notes Patient identified. Risks/Benefits/Options discussed with patient including but not limited to bleeding, infection, nerve damage, paralysis, failed block, incomplete pain control, headache, blood pressure changes, nausea, vomiting, reactions to medications, itching and postpartum back pain. Confirmed with bedside nurse the patient's most recent platelet count. Confirmed with patient that they are not currently taking any anticoagulation, have any bleeding history or any family history of bleeding disorders. Patient expressed understanding and wished to proceed. All questions were answered. Sterile technique was used throughout the entire procedure. Please see nursing notes for vital signs. Warning signs of high block given to the patient including shortness of breath, tingling/numbness in hands, complete motor block, or any concerning symptoms with instructions to call for help. Patient was given instructions on fall risk and not to get out of bed. All questions and concerns addressed with instructions to call with any issues or inadequate analgesia.

## 2022-06-23 DIAGNOSIS — I10 Essential (primary) hypertension: Secondary | ICD-10-CM | POA: Diagnosis not present

## 2022-06-23 DIAGNOSIS — E039 Hypothyroidism, unspecified: Secondary | ICD-10-CM | POA: Diagnosis not present

## 2022-06-23 DIAGNOSIS — M1612 Unilateral primary osteoarthritis, left hip: Secondary | ICD-10-CM | POA: Diagnosis not present

## 2022-06-23 DIAGNOSIS — Z87891 Personal history of nicotine dependence: Secondary | ICD-10-CM | POA: Diagnosis not present

## 2022-06-23 DIAGNOSIS — Z7902 Long term (current) use of antithrombotics/antiplatelets: Secondary | ICD-10-CM | POA: Diagnosis not present

## 2022-06-23 DIAGNOSIS — J45909 Unspecified asthma, uncomplicated: Secondary | ICD-10-CM | POA: Diagnosis not present

## 2022-06-23 DIAGNOSIS — Z79899 Other long term (current) drug therapy: Secondary | ICD-10-CM | POA: Diagnosis not present

## 2022-06-23 LAB — CBC
HCT: 27.4 % — ABNORMAL LOW (ref 36.0–46.0)
Hemoglobin: 9 g/dL — ABNORMAL LOW (ref 12.0–15.0)
MCH: 31.4 pg (ref 26.0–34.0)
MCHC: 32.8 g/dL (ref 30.0–36.0)
MCV: 95.5 fL (ref 80.0–100.0)
Platelets: 289 10*3/uL (ref 150–400)
RBC: 2.87 MIL/uL — ABNORMAL LOW (ref 3.87–5.11)
RDW: 15.7 % — ABNORMAL HIGH (ref 11.5–15.5)
WBC: 10.5 10*3/uL (ref 4.0–10.5)
nRBC: 0 % (ref 0.0–0.2)

## 2022-06-23 LAB — BASIC METABOLIC PANEL
Anion gap: 7 (ref 5–15)
BUN: 14 mg/dL (ref 8–23)
CO2: 23 mmol/L (ref 22–32)
Calcium: 8.4 mg/dL — ABNORMAL LOW (ref 8.9–10.3)
Chloride: 104 mmol/L (ref 98–111)
Creatinine, Ser: 0.69 mg/dL (ref 0.44–1.00)
GFR, Estimated: >60 mL/min (ref >60)
Glucose, Bld: 112 mg/dL — ABNORMAL HIGH (ref 70–99)
Potassium: 3.5 mmol/L (ref 3.5–5.1)
Sodium: 134 mmol/L — ABNORMAL LOW (ref 135–145)

## 2022-06-23 MED ORDER — METHOCARBAMOL 500 MG PO TABS: 500.0000 mg | ORAL_TABLET | Freq: Four times a day (QID) | ORAL | 1 refills | Status: AC | PRN

## 2022-06-23 MED ORDER — OXYCODONE HCL 5 MG PO TABS: 5.0000 mg | ORAL_TABLET | Freq: Four times a day (QID) | ORAL | 0 refills | Status: DC | PRN

## 2022-06-23 NOTE — Progress Notes (Signed)
Physical Therapy Treatment Patient Details Name: [name] MRN: [id number] DOB: [birth year] Today's Date: 06/23/2022   History of Present Illness Pt is a 77 year old female s/p L THA on 06/22/22.  PMHx significant for but not limited to: fibromyalgia, asthma, PVD, CAD, HTN, HLD, multiple back surgeries    PT Comments    Pt ambulated in hallway and performed LE exercises.  Pt provided with HEP handout. Pt reports understanding and feels ready for d/c home today.    Recommendations for follow up therapy are one component of a multi-disciplinary discharge planning process, led by the attending physician.  Recommendations may be updated based on patient status, additional functional criteria and insurance authorization.  Follow Up Recommendations  Follow physician's recommendations for discharge plan and follow up therapies     Assistance Recommended at Discharge PRN  Patient can return home with the following Help with stairs or ramp for entrance;Assistance with cooking/housework   Equipment Recommendations  Rolling walker (2 wheels)    Recommendations for Other Services       Precautions / Restrictions Precautions Precautions: Fall Restrictions Weight Bearing Restrictions: No LLE Weight Bearing: Weight bearing as tolerated     Mobility  Bed Mobility               General bed mobility comments: pt OOB with NT ambulating to bathroom; pt using new RW which was not set so NT provided close guard until pt in bathroom, therapist then adjusted RW    Transfers Overall transfer level: Needs assistance Equipment used: Rolling walker (2 wheels) Transfers: Sit to/from Stand Sit to Stand: Min guard           General transfer comment: verbal cues for UE and LE positioning and safety    Ambulation/Gait Ambulation/Gait assistance: Min guard Gait Distance (Feet): 200 Feet Assistive device: Rolling walker (2 wheels) Gait Pattern/deviations: Decreased stride length,  Step-through pattern, Antalgic       General Gait Details: mildly antalgic gait but steady with RW   Stairs             Wheelchair Mobility    Modified Rankin (Stroke Patients Only)       Balance                                            Cognition Arousal/Alertness: Awake/alert Behavior During Therapy: WFL for tasks assessed/performed Overall Cognitive Status: Within Functional Limits for tasks assessed                                          Exercises Total Joint Exercises Ankle Circles/Pumps: AROM, Both, 10 reps Quad Sets: AROM, Both, 10 reps Heel Slides: AAROM, Left, 10 reps Hip ABduction/ADduction: AAROM, Left, 10 reps, Supine, Standing Long Arc Quad: AROM, Left, Seated, 10 reps Knee Flexion: AROM, Standing, Left, 10 reps Marching in Standing: AROM, Left, Standing, 10 reps Standing Hip Extension: AROM, Standing, Left, 10 reps    General Comments        Pertinent Vitals/Pain Pain Assessment Pain Assessment: 0-10 Pain Score: 4  Pain Location: left hip Pain Descriptors / Indicators: Burning, Sore Pain Intervention(s): Monitored during session, Repositioned, Premedicated before session    Home Living  Prior Function            PT Goals (current goals can now be found in the care plan section) Progress towards PT goals: Progressing toward goals    Frequency    7X/week      PT Plan Current plan remains appropriate    Co-evaluation              AM-PAC PT "6 Clicks" Mobility   Outcome Measure  Help needed turning from your back to your side while in a flat bed without using bedrails?: None Help needed moving from lying on your back to sitting on the side of a flat bed without using bedrails?: A Little Help needed moving to and from a bed to a chair (including a wheelchair)?: A Little Help needed standing up from a chair using your arms (e.g., wheelchair or  bedside chair)?: A Little Help needed to walk in hospital room?: A Little Help needed climbing 3-5 steps with a railing? : A Little 6 Click Score: 19    End of Session Equipment Utilized During Treatment: Gait belt Activity Tolerance: Patient tolerated treatment well Patient left: in chair;with call bell/phone within reach;with chair alarm set Nurse Communication: Mobility status PT Visit Diagnosis: Difficulty in walking, not elsewhere classified (R26.2)     Time: [id number] PT Time Calculation (min) (ACUTE ONLY): 16 min  Charges:  $Gait Training: 8-22 mins                     Arlyce Dice, DPT Physical Therapist Acute Rehabilitation Services Preferred contact method: Secure Chat Weekend Pager Only: [phone] Office: [phone]    [name] 06/23/2022, 11:18 AM

## 2022-06-23 NOTE — Plan of Care (Signed)
  Problem: Education: Goal: Knowledge of General Education information will improve Description: Including pain rating scale, medication(s)/side effects and non-pharmacologic comfort measures Outcome: Progressing   Problem: Health Behavior/Discharge Planning: Goal: Ability to manage health-related needs will improve Outcome: Progressing   Problem: Activity: Goal: Risk for activity intolerance will decrease Outcome: Progressing   

## 2022-06-23 NOTE — Progress Notes (Signed)
The patient is alert and oriented and has been seen by her physician. The orders for discharge were written. IV has been removed. Went over discharge instructions with patient. She is being discharged via wheelchair with all of her belongings.   

## 2022-06-23 NOTE — Progress Notes (Signed)
Subjective: 1 Day Post-Op Procedure(s) (LRB): LEFT TOTAL HIP ARTHROPLASTY ANTERIOR APPROACH (Left) Patient reports pain as mild.    Objective: Vital signs in last 24 hours: Temp:  [97.4 F (36.3 C)-98.4 F (36.9 C)] 98 F (36.7 C) (03/09 0906) Pulse Rate:  [55-78] 78 (03/09 0906) Resp:  [12-20] 20 (03/09 0906) BP: (80-145)/(54-77) 137/68 (03/09 0906) SpO2:  [90 %-100 %] 96 % (03/09 0906)  Intake/Output from previous day: 03/08 0701 - 03/09 0700 In: 1929.3 [P.O.:380; I.V.:1494.3; IV Piggyback:55] Out: 2470 [Urine:2120; Blood:350] Intake/Output this shift: Total I/O In: 240 [P.O.:240] Out: -   Recent Labs    06/23/22 0343  HGB 9.0*   Recent Labs    06/23/22 0343  WBC 10.5  RBC 2.87*  HCT 27.4*  PLT 289   Recent Labs    06/23/22 0343  NA 134*  K 3.5  CL 104  CO2 23  BUN 14  CREATININE 0.69  GLUCOSE 112*  CALCIUM 8.4*   No results for input(s): "LABPT", "INR" in the last 72 hours.  Sensation intact distally Intact pulses distally Dorsiflexion/Plantar flexion intact Incision: dressing C/D/I   Assessment/Plan: 1 Day Post-Op Procedure(s) (LRB): LEFT TOTAL HIP ARTHROPLASTY ANTERIOR APPROACH (Left) Up with therapy Discharge home with home health      Mcarthur Rossetti 06/23/2022, 11:02 AM

## 2022-06-23 NOTE — Discharge Summary (Signed)
Patient ID: [name] MRN: [id number] DOB/AGE: [birth year] 77 y.o.  Admit date: 06/22/2022 Discharge date: 06/23/2022  Admission Diagnoses:  Principal Problem:   Unilateral primary osteoarthritis, left hip Active Problems:   Status post total replacement of left hip   Discharge Diagnoses:  Same  Past Medical History:  Diagnosis Date   Arthritis    "all over" (07/25/2012)   Asthma    Carotid artery disease (HCC)    Environmental allergies    Fibromyalgia    GERD (gastroesophageal reflux disease)    Hyperlipidemia    Hypertension    Hypothyroidism    Peripheral vascular disease ([place])    Post-menopausal    on HRT x 25 years    Surgeries: Procedure(s): LEFT TOTAL HIP ARTHROPLASTY ANTERIOR APPROACH on 06/22/2022   Consultants:   Discharged Condition: Improved  Hospital Course: RIKI LAGASCA is an 77 y.o. female who was admitted 06/22/2022 for operative treatment ofUnilateral primary osteoarthritis, left hip. Patient has severe unremitting pain that affects sleep, daily activities, and work/hobbies. After pre-op clearance the patient was taken to the operating room on 06/22/2022 and underwent  Procedure(s): LEFT TOTAL HIP ARTHROPLASTY ANTERIOR APPROACH.    Patient was given perioperative antibiotics:  Anti-infectives (From admission, onward)    Start     Dose/Rate Route Frequency Ordered Stop   06/22/22 1600  ceFAZolin (ANCEF) IVPB 1 g/50 mL premix        1 g 100 mL/hr over 30 Minutes Intravenous Every 6 hours 06/22/22 1300 06/23/22 0034   06/22/22 1600  fluconazole (DIFLUCAN) tablet 150 mg        150 mg Oral  Once 06/22/22 1501     06/22/22 0730  ceFAZolin (ANCEF) IVPB 2g/100 mL premix        2 g 200 mL/hr over 30 Minutes Intravenous On call to O.R. 06/22/22 0726 06/22/22 1015        Patient was given sequential compression devices, early ambulation, and chemoprophylaxis to prevent DVT.  Patient benefited maximally from hospital stay and there were no complications.     Recent vital signs: Patient Vitals for the past 24 hrs:  BP Temp Temp src Pulse Resp SpO2  06/23/22 0906 137/68 98 F (36.7 C) Oral 78 20 96 %  06/23/22 0816 (!) 145/68 -- -- 72 -- --  06/23/22 0610 123/67 98.4 F (36.9 C) Oral 71 18 96 %  06/23/22 0114 122/72 (!) 97.5 F (36.4 C) Oral 75 18 90 %  06/22/22 2141 (!) 140/70 97.8 F (36.6 C) Oral 76 18 97 %  06/22/22 1743 137/68 97.8 F (36.6 C) Oral 71 18 96 %  06/22/22 1500 (!) 144/77 (!) 97.4 F (36.3 C) Oral 66 18 98 %  06/22/22 1400 131/74 -- -- 61 20 100 %  06/22/22 1315 111/75 -- -- (!) 58 12 94 %  06/22/22 1300 119/65 -- -- (!) 58 12 94 %  06/22/22 1245 113/74 -- -- 67 12 94 %  06/22/22 1230 107/61 -- -- 66 15 96 %  06/22/22 1215 111/68 -- -- (!) 55 13 100 %  06/22/22 1205 98/65 -- -- (!) 57 15 98 %  06/22/22 1200 101/68 -- -- (!) 57 15 100 %  06/22/22 1155 (!) 87/58 -- -- (!) 58 14 100 %  06/22/22 1150 (!) 87/57 -- -- (!) 55 14 99 %  06/22/22 1145 (!) 83/54 -- -- (!) 59 15 99 %  06/22/22 1143 (!) 80/55 (!) 97.5 F (36.4 C) -- (!) 58  16 96 %     Recent laboratory studies:  Recent Labs    06/23/22 0343  WBC 10.5  HGB 9.0*  HCT 27.4*  PLT 289  NA 134*  K 3.5  CL 104  CO2 23  BUN 14  CREATININE 0.69  GLUCOSE 112*  CALCIUM 8.4*     Discharge Medications:   Allergies as of 06/23/2022       Reactions   Penicillin G    Childhood Reaction    Pine    Seasonal allergy   Codeine Itching        Medication List     STOP taking these medications    HYDROcodone-acetaminophen 10-325 MG tablet Commonly known as: NORCO   HYDROcodone-acetaminophen 5-325 MG tablet Commonly known as: NORCO/VICODIN   traMADol 50 MG tablet Commonly known as: ULTRAM       TAKE these medications    amitriptyline 150 MG tablet Commonly known as: ELAVIL Take 150 mg by mouth at bedtime.   amLODipine 10 MG tablet Commonly known as: NORVASC Take 10 mg by mouth daily.   atorvastatin 20 MG tablet Commonly known as:  LIPITOR Take 20 mg by mouth daily.   cholecalciferol 25 MCG (1000 UNIT) tablet Commonly known as: VITAMIN D3 Take 1,000 Units by mouth daily.   clopidogrel 75 MG tablet Commonly known as: PLAVIX Take 75 mg by mouth daily.   estrogens (conjugated) 0.625 MG tablet Commonly known as: PREMARIN Take 0.625 mg by mouth daily. Take daily for 21 days then do not take for 7 days.   Fluticasone-Salmeterol 100-50 MCG/DOSE Aepb Commonly known as: ADVAIR Inhale 1 puff into the lungs 2 (two) times daily as needed (shortness of breath).   folic acid 1 MG tablet Commonly known as: FOLVITE Take 1 mg by mouth daily.   gentamicin cream 0.1 % Commonly known as: GARAMYCIN Apply 1 Application topically 3 (three) times daily.   levothyroxine 100 MCG tablet Commonly known as: SYNTHROID Take 100 mcg by mouth daily before breakfast.   losartan 50 MG tablet Commonly known as: COZAAR Take 50 mg by mouth daily.   meloxicam 15 MG tablet Commonly known as: MOBIC Take 15 mg by mouth daily.   methocarbamol 500 MG tablet Commonly known as: ROBAXIN Take 1 tablet (500 mg total) by mouth every 6 (six) hours as needed for muscle spasms.   methotrexate 2.5 MG tablet Commonly known as: RHEUMATREX Take 5 mg by mouth 4 (four) times a week. Mon - Thurs   multivitamin with minerals Tabs tablet Take 1 tablet by mouth daily.   nystatin 100000 UNIT/ML suspension Commonly known as: MYCOSTATIN Take 5 mLs by mouth 3 (three) times daily.   oxyCODONE 5 MG immediate release tablet Commonly known as: Oxy IR/ROXICODONE Take 1-2 tablets (5-10 mg total) by mouth every 6 (six) hours as needed for moderate pain (pain score 4-6).   pantoprazole 40 MG tablet Commonly known as: PROTONIX Take 40 mg by mouth daily.   pregabalin 50 MG capsule Commonly known as: LYRICA Take 50 mg by mouth 2 (two) times daily.   Repatha 140 MG/ML Sosy Generic drug: Evolocumab Inject 140 mg into the skin every 14 (fourteen) days.                Durable Medical Equipment  (From admission, onward)           Start     Ordered   06/22/22 1502  DME 3 n 1  Once  06/22/22 1501   06/22/22 1502  DME Walker rolling  Once       Question Answer Comment  Walker: With 5 Inch Wheels   Patient needs a walker to treat with the following condition Status post total replacement of left hip      06/22/22 1501            Diagnostic Studies: DG Hip Port Unilat With Pelvis 1V Left  Result Date: 06/22/2022 CLINICAL DATA:  [id number] Status post total replacement of left hip [id number] EXAM: DG HIP (WITH OR WITHOUT PELVIS) 1V PORT LEFT COMPARISON:  Pelvis radiograph 04/05/2022 FINDINGS: Postsurgical changes of left hip arthroplasty. Expected soft tissue gas and swelling. Normal alignment. Vascular stents noted. No evidence of loosening or fracture. IMPRESSION: Postsurgical changes of left hip arthroplasty. No evidence of immediate hardware complication. Electronically Signed   By: Maurine Simmering M.D.   On: 06/22/2022 12:47   DG HIP UNILAT WITH PELVIS 1V LEFT  Result Date: 06/22/2022 CLINICAL DATA:  LEFT hip arthroplasty EXAM: DG HIP (WITH OR WITHOUT PELVIS) 2V*L* COMPARISON:  None Available. FINDINGS: Intraoperative spot views of the LEFT hip are submitted postoperatively for interpretation. LEFT total hip arthroplasty noted without definite complicating features. IMPRESSION: LEFT total hip arthroplasty without definite complicating features. Electronically Signed   By: Margarette Canada M.D.   On: 06/22/2022 11:29   DG C-Arm 1-60 Min-No Report  Result Date: 06/22/2022 Fluoroscopy was utilized by the requesting physician.  No radiographic interpretation.    Disposition: Discharge disposition: 01-Home or [place], [place] Follow up.   Specialty: [place] Why: to provide home physical therapy visits Contact information: [address]  [place]  [address] [phone]         Mcarthur Rossetti, MD Follow up in 2 week(s).   Specialty: Orthopedic Surgery Contact information: [address] [place] [address] [phone]                  Signed: Mcarthur Rossetti 06/23/2022, 11:04 AM

## 2022-06-23 NOTE — Discharge Instructions (Signed)

## 2022-06-23 NOTE — Plan of Care (Signed)
  Problem: Education: Goal: Knowledge of the prescribed therapeutic regimen will improve Outcome: Progressing Goal: Understanding of discharge needs will improve Outcome: Progressing Goal: Individualized Educational Video(s) Outcome: Progressing   Problem: Activity: Goal: Ability to avoid complications of mobility impairment will improve Outcome: Progressing Goal: Ability to tolerate increased activity will improve Outcome: Progressing   Problem: Clinical Measurements: Goal: Postoperative complications will be avoided or minimized Outcome: Progressing   Problem: Pain Management: Goal: Pain level will decrease with appropriate interventions Outcome: Progressing   Problem: Skin Integrity: Goal: Will show signs of wound healing Outcome: Progressing   Problem: Education: Goal: Knowledge of General Education information will improve Description: Including pain rating scale, medication(s)/side effects and non-pharmacologic comfort measures Outcome: Progressing   Problem: Clinical Measurements: Goal: Ability to maintain clinical measurements within normal limits will improve Outcome: Progressing Goal: Will remain free from infection Outcome: Progressing Goal: Diagnostic test results will improve Outcome: Progressing Goal: Respiratory complications will improve Outcome: Progressing Goal: Cardiovascular complication will be avoided Outcome: Progressing   Problem: Activity: Goal: Risk for activity intolerance will decrease Outcome: Progressing   Problem: Nutrition: Goal: Adequate nutrition will be maintained Outcome: Progressing   Problem: Coping: Goal: Level of anxiety will decrease Outcome: Progressing   Problem: Elimination: Goal: Will not experience complications related to bowel motility Outcome: Progressing Goal: Will not experience complications related to urinary retention Outcome: Progressing   Problem: Pain Managment: Goal: General experience of comfort  will improve Outcome: Progressing   Problem: Safety: Goal: Ability to remain free from injury will improve Outcome: Progressing   Problem: Skin Integrity: Goal: Risk for impaired skin integrity will decrease Outcome: Progressing

## 2022-06-24 DIAGNOSIS — E785 Hyperlipidemia, unspecified: Secondary | ICD-10-CM | POA: Diagnosis not present

## 2022-06-24 DIAGNOSIS — I10 Essential (primary) hypertension: Secondary | ICD-10-CM | POA: Diagnosis not present

## 2022-06-24 DIAGNOSIS — Z471 Aftercare following joint replacement surgery: Secondary | ICD-10-CM | POA: Diagnosis not present

## 2022-06-24 DIAGNOSIS — I739 Peripheral vascular disease, unspecified: Secondary | ICD-10-CM | POA: Diagnosis not present

## 2022-06-24 DIAGNOSIS — M797 Fibromyalgia: Secondary | ICD-10-CM | POA: Diagnosis not present

## 2022-06-24 DIAGNOSIS — J45909 Unspecified asthma, uncomplicated: Secondary | ICD-10-CM | POA: Diagnosis not present

## 2022-06-24 DIAGNOSIS — I6529 Occlusion and stenosis of unspecified carotid artery: Secondary | ICD-10-CM | POA: Diagnosis not present

## 2022-06-24 DIAGNOSIS — E039 Hypothyroidism, unspecified: Secondary | ICD-10-CM | POA: Diagnosis not present

## 2022-06-24 DIAGNOSIS — R32 Unspecified urinary incontinence: Secondary | ICD-10-CM | POA: Diagnosis not present

## 2022-06-25 ENCOUNTER — Telehealth: Payer: Self-pay | Admitting: *Deleted

## 2022-06-25 NOTE — Telephone Encounter (Signed)
Ortho bundle D/C call completed. 

## 2022-06-27 ENCOUNTER — Encounter (HOSPITAL_COMMUNITY): Payer: Self-pay | Admitting: Orthopaedic Surgery

## 2022-06-27 DIAGNOSIS — R32 Unspecified urinary incontinence: Secondary | ICD-10-CM | POA: Diagnosis not present

## 2022-06-27 DIAGNOSIS — E785 Hyperlipidemia, unspecified: Secondary | ICD-10-CM | POA: Diagnosis not present

## 2022-06-27 DIAGNOSIS — I6529 Occlusion and stenosis of unspecified carotid artery: Secondary | ICD-10-CM | POA: Diagnosis not present

## 2022-06-27 DIAGNOSIS — M797 Fibromyalgia: Secondary | ICD-10-CM | POA: Diagnosis not present

## 2022-06-27 DIAGNOSIS — I739 Peripheral vascular disease, unspecified: Secondary | ICD-10-CM | POA: Diagnosis not present

## 2022-06-27 DIAGNOSIS — E039 Hypothyroidism, unspecified: Secondary | ICD-10-CM | POA: Diagnosis not present

## 2022-06-27 DIAGNOSIS — J45909 Unspecified asthma, uncomplicated: Secondary | ICD-10-CM | POA: Diagnosis not present

## 2022-06-27 DIAGNOSIS — I10 Essential (primary) hypertension: Secondary | ICD-10-CM | POA: Diagnosis not present

## 2022-06-27 DIAGNOSIS — Z471 Aftercare following joint replacement surgery: Secondary | ICD-10-CM | POA: Diagnosis not present

## 2022-06-29 DIAGNOSIS — I739 Peripheral vascular disease, unspecified: Secondary | ICD-10-CM | POA: Diagnosis not present

## 2022-06-29 DIAGNOSIS — R32 Unspecified urinary incontinence: Secondary | ICD-10-CM | POA: Diagnosis not present

## 2022-06-29 DIAGNOSIS — M797 Fibromyalgia: Secondary | ICD-10-CM | POA: Diagnosis not present

## 2022-06-29 DIAGNOSIS — I10 Essential (primary) hypertension: Secondary | ICD-10-CM | POA: Diagnosis not present

## 2022-06-29 DIAGNOSIS — E039 Hypothyroidism, unspecified: Secondary | ICD-10-CM | POA: Diagnosis not present

## 2022-06-29 DIAGNOSIS — Z471 Aftercare following joint replacement surgery: Secondary | ICD-10-CM | POA: Diagnosis not present

## 2022-06-29 DIAGNOSIS — E785 Hyperlipidemia, unspecified: Secondary | ICD-10-CM | POA: Diagnosis not present

## 2022-06-29 DIAGNOSIS — I6529 Occlusion and stenosis of unspecified carotid artery: Secondary | ICD-10-CM | POA: Diagnosis not present

## 2022-06-29 DIAGNOSIS — J45909 Unspecified asthma, uncomplicated: Secondary | ICD-10-CM | POA: Diagnosis not present

## 2022-07-02 DIAGNOSIS — E039 Hypothyroidism, unspecified: Secondary | ICD-10-CM | POA: Diagnosis not present

## 2022-07-02 DIAGNOSIS — M797 Fibromyalgia: Secondary | ICD-10-CM | POA: Diagnosis not present

## 2022-07-02 DIAGNOSIS — E785 Hyperlipidemia, unspecified: Secondary | ICD-10-CM | POA: Diagnosis not present

## 2022-07-02 DIAGNOSIS — I6529 Occlusion and stenosis of unspecified carotid artery: Secondary | ICD-10-CM | POA: Diagnosis not present

## 2022-07-02 DIAGNOSIS — R32 Unspecified urinary incontinence: Secondary | ICD-10-CM | POA: Diagnosis not present

## 2022-07-02 DIAGNOSIS — Z471 Aftercare following joint replacement surgery: Secondary | ICD-10-CM | POA: Diagnosis not present

## 2022-07-02 DIAGNOSIS — J45909 Unspecified asthma, uncomplicated: Secondary | ICD-10-CM | POA: Diagnosis not present

## 2022-07-02 DIAGNOSIS — I10 Essential (primary) hypertension: Secondary | ICD-10-CM | POA: Diagnosis not present

## 2022-07-02 DIAGNOSIS — I739 Peripheral vascular disease, unspecified: Secondary | ICD-10-CM | POA: Diagnosis not present

## 2022-07-04 DIAGNOSIS — I6529 Occlusion and stenosis of unspecified carotid artery: Secondary | ICD-10-CM | POA: Diagnosis not present

## 2022-07-04 DIAGNOSIS — Z471 Aftercare following joint replacement surgery: Secondary | ICD-10-CM | POA: Diagnosis not present

## 2022-07-04 DIAGNOSIS — E785 Hyperlipidemia, unspecified: Secondary | ICD-10-CM | POA: Diagnosis not present

## 2022-07-04 DIAGNOSIS — I739 Peripheral vascular disease, unspecified: Secondary | ICD-10-CM | POA: Diagnosis not present

## 2022-07-04 DIAGNOSIS — R32 Unspecified urinary incontinence: Secondary | ICD-10-CM | POA: Diagnosis not present

## 2022-07-04 DIAGNOSIS — M797 Fibromyalgia: Secondary | ICD-10-CM | POA: Diagnosis not present

## 2022-07-04 DIAGNOSIS — J45909 Unspecified asthma, uncomplicated: Secondary | ICD-10-CM | POA: Diagnosis not present

## 2022-07-04 DIAGNOSIS — I10 Essential (primary) hypertension: Secondary | ICD-10-CM | POA: Diagnosis not present

## 2022-07-04 DIAGNOSIS — E039 Hypothyroidism, unspecified: Secondary | ICD-10-CM | POA: Diagnosis not present

## 2022-07-05 ENCOUNTER — Ambulatory Visit (INDEPENDENT_AMBULATORY_CARE_PROVIDER_SITE_OTHER): Payer: Medicare HMO | Admitting: Orthopaedic Surgery

## 2022-07-05 ENCOUNTER — Encounter: Payer: Self-pay | Admitting: Orthopaedic Surgery

## 2022-07-05 DIAGNOSIS — Z96642 Presence of left artificial hip joint: Secondary | ICD-10-CM

## 2022-07-05 MED ORDER — OXYCODONE HCL 5 MG PO TABS: 5.0000 mg | ORAL_TABLET | Freq: Four times a day (QID) | ORAL | 0 refills | Status: DC | PRN

## 2022-07-05 NOTE — Progress Notes (Signed)
The patient comes in today for first postoperative visit status post a left total hip arthroplasty.  She says she is doing good and reports good range of motion and strength.  She is 77 years old and active.  She does not walk reviewed with assistive device in the office today.  She has been having home health therapy.  She is on Plavix which she was on before surgery.  Her left hip incision looks good.  The staples are removed and Steri-Strips applied.  There is just a mild seroma and I aspirated about 40 cc of fluid off of her hip area.  Her leg lengths are equal.  She will continue to increase her activities as comfort allows.  I will send in 1 more prescription for pain medicine.  We will see her back in 4 weeks to see how she is doing overall from a mobility standpoint but no x-rays are needed.

## 2022-07-06 DIAGNOSIS — I10 Essential (primary) hypertension: Secondary | ICD-10-CM | POA: Diagnosis not present

## 2022-07-06 DIAGNOSIS — E785 Hyperlipidemia, unspecified: Secondary | ICD-10-CM | POA: Diagnosis not present

## 2022-07-06 DIAGNOSIS — E039 Hypothyroidism, unspecified: Secondary | ICD-10-CM | POA: Diagnosis not present

## 2022-07-06 DIAGNOSIS — R32 Unspecified urinary incontinence: Secondary | ICD-10-CM | POA: Diagnosis not present

## 2022-07-06 DIAGNOSIS — J45909 Unspecified asthma, uncomplicated: Secondary | ICD-10-CM | POA: Diagnosis not present

## 2022-07-06 DIAGNOSIS — I6529 Occlusion and stenosis of unspecified carotid artery: Secondary | ICD-10-CM | POA: Diagnosis not present

## 2022-07-06 DIAGNOSIS — M797 Fibromyalgia: Secondary | ICD-10-CM | POA: Diagnosis not present

## 2022-07-06 DIAGNOSIS — I739 Peripheral vascular disease, unspecified: Secondary | ICD-10-CM | POA: Diagnosis not present

## 2022-07-06 DIAGNOSIS — Z471 Aftercare following joint replacement surgery: Secondary | ICD-10-CM | POA: Diagnosis not present

## 2022-08-06 ENCOUNTER — Encounter: Payer: Self-pay | Admitting: Orthopaedic Surgery

## 2022-08-06 ENCOUNTER — Ambulatory Visit (INDEPENDENT_AMBULATORY_CARE_PROVIDER_SITE_OTHER): Payer: Medicare HMO | Admitting: Orthopaedic Surgery

## 2022-08-06 DIAGNOSIS — Z96642 Presence of left artificial hip joint: Secondary | ICD-10-CM

## 2022-08-06 NOTE — Progress Notes (Signed)
The patient is now 6 weeks status post a left total hip replacement.  She is doing well.  She is 77 years old.  She is not walking with assistive device at all.  She still reports some swelling and soreness but overall is making good progress.  Her left operative hip moves smoothly and fluidly with no issues at all.  She does have known arthritis in her right hip and it is stiff on exam but she is asymptomatic in terms of pain.  From my standpoint she will continue to increase her activities as comfort allows with no restrictions.  The next time we need to see her now for 3 months unless there are issues.  Will have a standing low AP pelvis and lateral of her left operative hip at that visit.  All questions and concerns were addressed and answered.

## 2022-09-03 ENCOUNTER — Ambulatory Visit (INDEPENDENT_AMBULATORY_CARE_PROVIDER_SITE_OTHER): Payer: Medicare HMO | Admitting: Physician Assistant

## 2022-09-03 ENCOUNTER — Encounter: Payer: Self-pay | Admitting: Physician Assistant

## 2022-09-03 DIAGNOSIS — M1711 Unilateral primary osteoarthritis, right knee: Secondary | ICD-10-CM | POA: Diagnosis not present

## 2022-09-03 MED ORDER — LIDOCAINE HCL 1 % IJ SOLN
5.0000 mL | INTRAMUSCULAR | Status: AC | PRN
  Administered 2022-09-03: 5 mL

## 2022-09-03 MED ORDER — METHYLPREDNISOLONE ACETATE 40 MG/ML IJ SUSP
40.0000 mg | INTRAMUSCULAR | Status: AC | PRN
  Administered 2022-09-03: 40 mg via INTRA_ARTICULAR

## 2022-09-03 NOTE — Progress Notes (Signed)
HPI: [name] comes in today due to right knee swelling.  She states she has had swelling in the right knee for over a month no falls no injuries.  She has had no fevers or chills.  She has tried hydrocodone, wraps and knee brace.  Patient is nondiabetic.  Prior radiographs of shown tricompartmental arthritis mostly involving the lateral compartment.  Review of systems: See HPI otherwise negative  Physical exam: General well-developed well-nourished thin female no acute distress mood and affect appropriate. Right knee: No abnormal warmth erythema slight edema.  Overall good range of motion.    Procedure Note  Patient: [name]             Date of Birth: 01-31-1946           MRN: [id number]             Visit Date: 09/03/2022  Procedures: Visit Diagnoses:  1. Primary osteoarthritis of right knee     Large Joint Inj: R knee on 09/03/2022 11:57 AM Indications: pain Details: 22 G 1.5 in needle, superolateral approach  Arthrogram: No  Medications: 5 mL lidocaine 1 %; 40 mg methylPREDNISolone acetate 40 MG/ML Aspirate: 0 mL Outcome: tolerated well, no immediate complications Procedure, treatment alternatives, risks and benefits explained, specific risks discussed. Consent was given by the patient. Immediately prior to procedure a time out was called to verify the correct patient, procedure, equipment, support staff and site/side marked as required. Patient was prepped and draped in the usual sterile fashion.     Plan: She will follow-up with [place] in September for 39-month follow-up from her left total hip arthroplasty or if she has any concerns or questions prior to that regarding her her left hip or right knee.  Questions were encouraged and answered.  Will obtain an AP pelvis and lateral view of her left hip at follow-up.

## 2022-09-16 IMAGING — MR MR LUMBAR SPINE W/O CM
4 of 5 series · 18 of 48 positions shown · non-contrast
Comparison: X-ray lumbar 12/28/2020; MR lumbar 03/13/2011.

CLINICAL DATA: Right low back pain radiating down leg for 3 months.
Patient reports surgery 14 years ago.

EXAM:
MRI LUMBAR SPINE WITHOUT CONTRAST
TECHNIQUE: Multiplanar, multisequence MR imaging of the lumbar spine was
performed. No intravenous contrast was administered.

[Series 5: T2 · sagittal · 4.0mm · 0.73mm/px · 6 of 15 slices shown (1 of 2)]
[im 1/15]
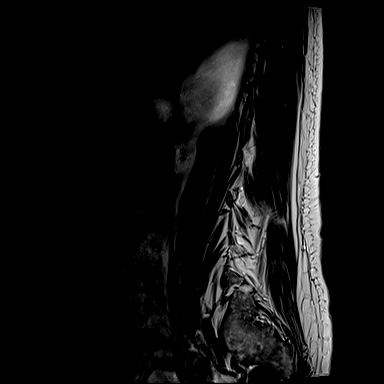
[im 3/15]
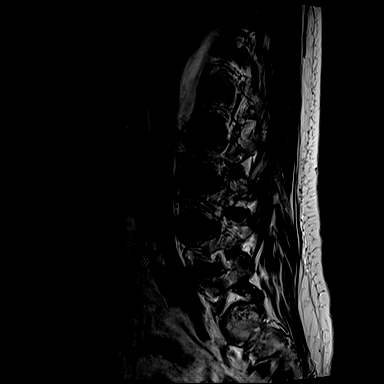
[im 6/15]
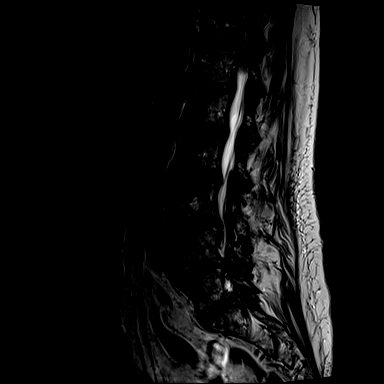
[im 9/15]
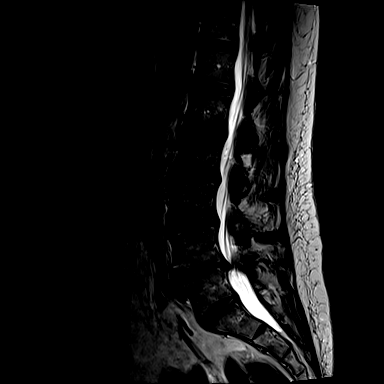
[im 12/15]
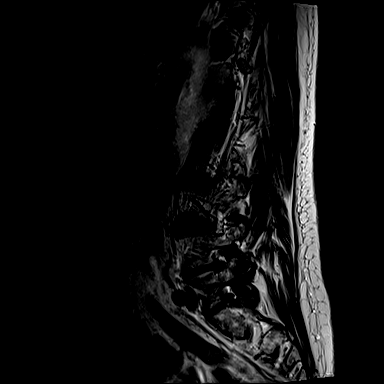
[im 15/15]
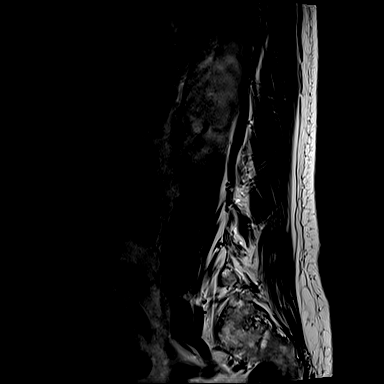

[Series 6: T1 · sagittal · 4.0mm · 0.73mm/px · 3 of 15 slices shown (1 of 2)]
[im 3/15]
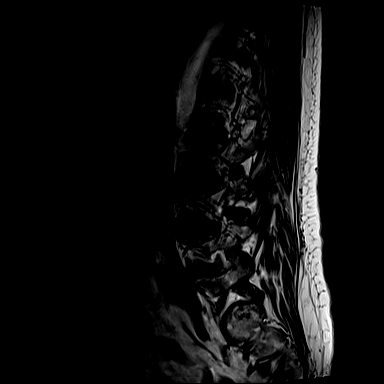
[im 9/15]
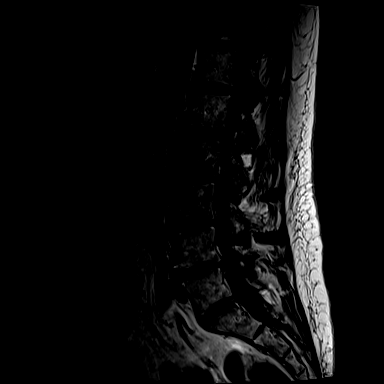
[im 15/15]
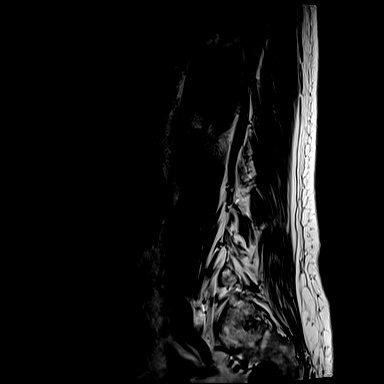

[Series 10: T1 · axial · 4.0mm · 0.28mm/px · z∈[+14,+173]mm · 3 of 39 slices shown (2 of 2)]
[im 6/39]
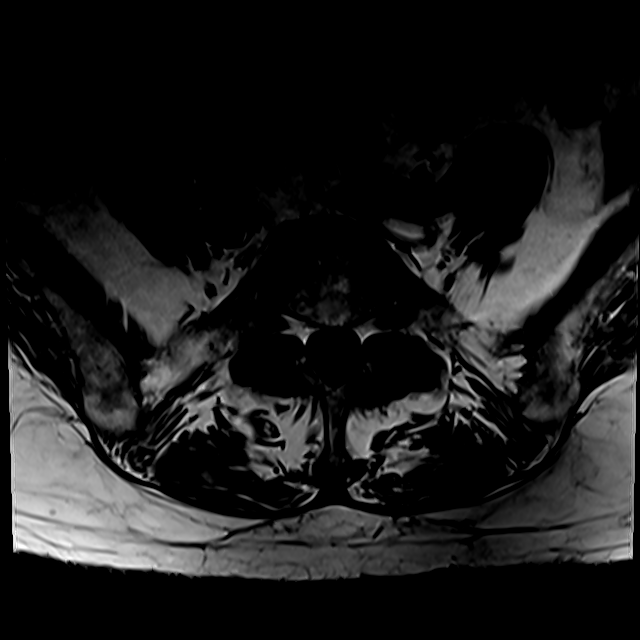
[im 20/39]
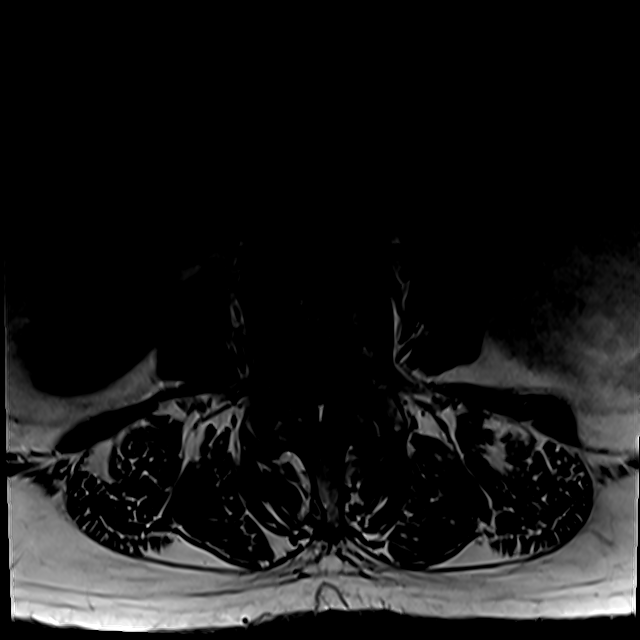
[im 33/39]
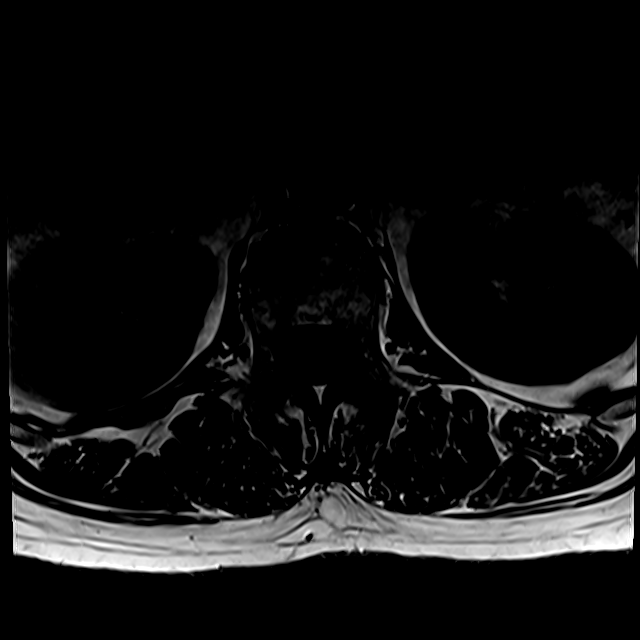

[Series 13: T2 · axial · 4.0mm · 0.28mm/px · z∈[-10,+173]mm · 6 of 39 slices shown (2 of 2)]
[im 1/39]
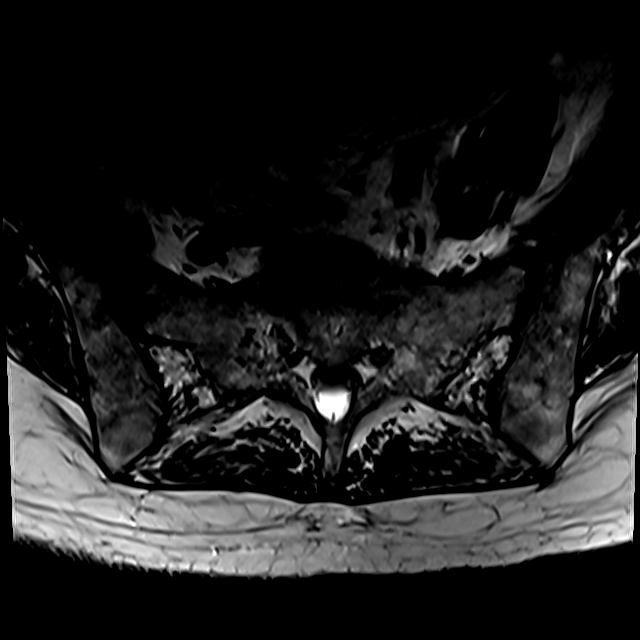
[im 6/39]
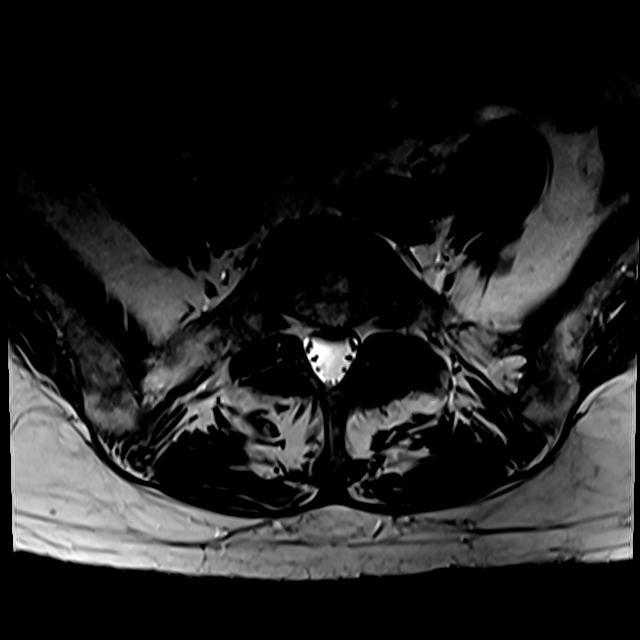
[im 11/39]
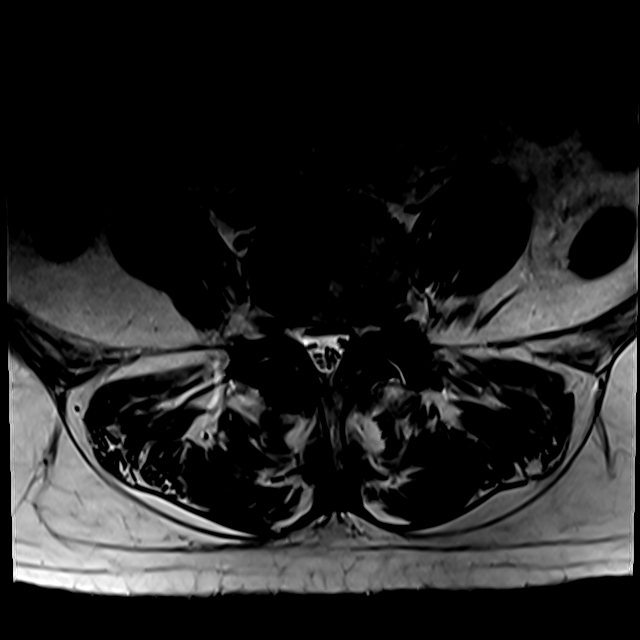
[im 17/39]
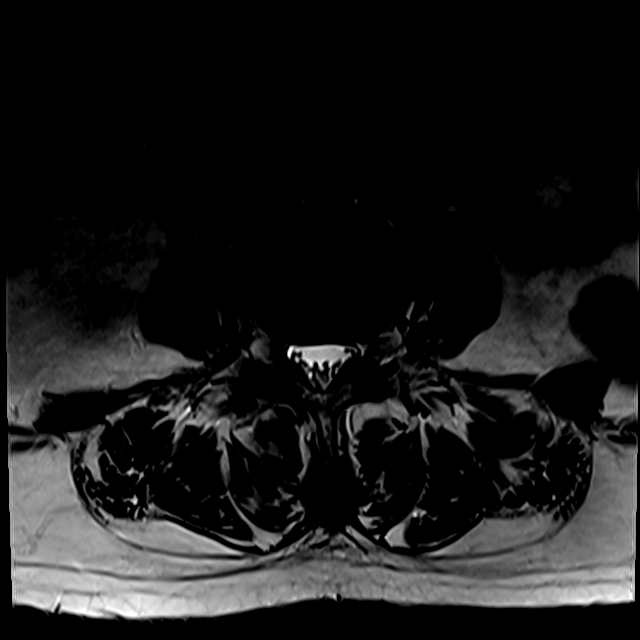
[im 20/39]
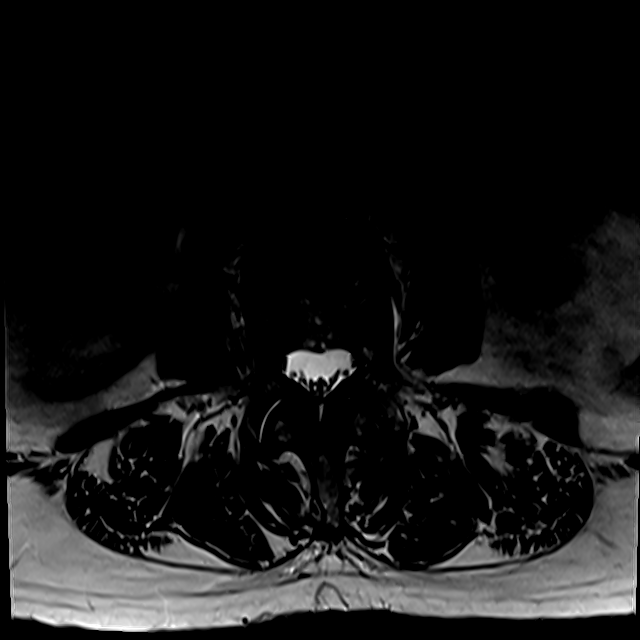
[im 33/39]
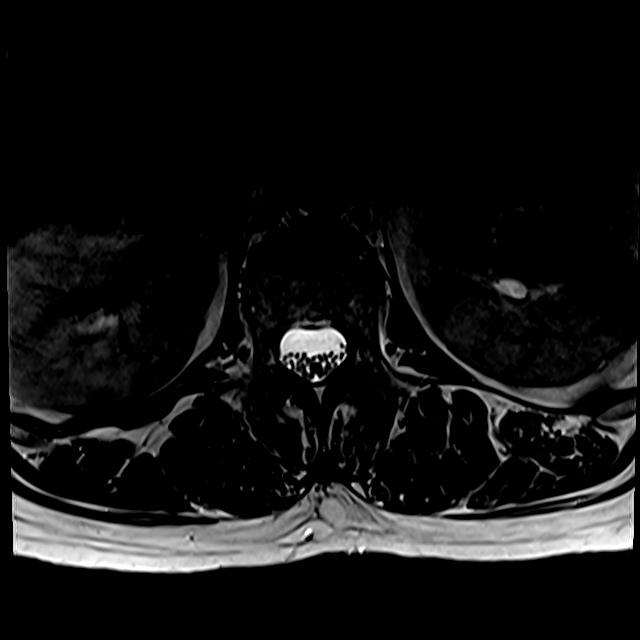

[18 of 48 positions shown; findings below may reference images not displayed]

FINDINGS: Segmentation:  Standard.

Alignment:  Physiologic.

Vertebrae: No acute fracture, evidence of discitis, or aggressive
bone lesion. 12 mm T1 and T2 hyperintense lesion in the right sacrum
likely reflecting a small hemangioma.

Conus medullaris and cauda equina: Conus extends to the T12-L1
level. Conus and cauda equina appear normal.

Paraspinal and other soft tissues: No acute paraspinal abnormality.

Disc levels:

Disc spaces: Degenerative disease with disc height loss at L2-3,
L3-4, L4-5 and to lesser extent L5-S1.

T12-L1: No significant disc bulge. No neural foraminal stenosis. No
central canal stenosis.

L1-L2: No significant disc bulge. No neural foraminal stenosis. No
central canal stenosis. Mild bilateral facet arthropathy.

L2-L3: Mild broad-based disc bulge. Mild bilateral facet
arthropathy. No foraminal or central canal stenosis.

L3-L4: Mild broad-based disc bulge. Moderate bilateral facet
arthropathy. Mild spinal stenosis. Bilateral lateral recess
stenosis. No foraminal stenosis.

L4-L5: Broad-based disc bulge with a central disc protrusion.
Moderate bilateral facet arthropathy. Severe spinal stenosis. Mild
bilateral foraminal stenosis.

L5-S1: Mild broad-based disc bulge. Moderate bilateral facet
arthropathy. Mild bilateral foraminal stenosis. No spinal stenosis.
IMPRESSION: 1. Lumbar spine spondylosis as described above.
2. No acute osseous injury of the lumbar spine.

## 2022-09-28 DIAGNOSIS — I10 Essential (primary) hypertension: Secondary | ICD-10-CM | POA: Diagnosis not present

## 2022-09-28 DIAGNOSIS — E7800 Pure hypercholesterolemia, unspecified: Secondary | ICD-10-CM | POA: Diagnosis not present

## 2022-09-28 DIAGNOSIS — R7309 Other abnormal glucose: Secondary | ICD-10-CM | POA: Diagnosis not present

## 2022-09-28 DIAGNOSIS — E039 Hypothyroidism, unspecified: Secondary | ICD-10-CM | POA: Diagnosis not present

## 2022-09-28 DIAGNOSIS — E559 Vitamin D deficiency, unspecified: Secondary | ICD-10-CM | POA: Diagnosis not present

## 2022-09-28 DIAGNOSIS — Z79899 Other long term (current) drug therapy: Secondary | ICD-10-CM | POA: Diagnosis not present

## 2022-10-04 DIAGNOSIS — E7800 Pure hypercholesterolemia, unspecified: Secondary | ICD-10-CM | POA: Diagnosis not present

## 2022-10-04 DIAGNOSIS — L409 Psoriasis, unspecified: Secondary | ICD-10-CM | POA: Diagnosis not present

## 2022-10-04 DIAGNOSIS — Z0000 Encounter for general adult medical examination without abnormal findings: Secondary | ICD-10-CM | POA: Diagnosis not present

## 2022-10-04 DIAGNOSIS — I739 Peripheral vascular disease, unspecified: Secondary | ICD-10-CM | POA: Diagnosis not present

## 2022-10-04 DIAGNOSIS — K219 Gastro-esophageal reflux disease without esophagitis: Secondary | ICD-10-CM | POA: Diagnosis not present

## 2022-10-04 DIAGNOSIS — M1990 Unspecified osteoarthritis, unspecified site: Secondary | ICD-10-CM | POA: Diagnosis not present

## 2022-10-04 DIAGNOSIS — E559 Vitamin D deficiency, unspecified: Secondary | ICD-10-CM | POA: Diagnosis not present

## 2022-10-04 DIAGNOSIS — I6523 Occlusion and stenosis of bilateral carotid arteries: Secondary | ICD-10-CM | POA: Diagnosis not present

## 2022-10-04 DIAGNOSIS — L4050 Arthropathic psoriasis, unspecified: Secondary | ICD-10-CM | POA: Diagnosis not present

## 2022-10-05 DIAGNOSIS — L4050 Arthropathic psoriasis, unspecified: Secondary | ICD-10-CM | POA: Diagnosis not present

## 2022-10-05 DIAGNOSIS — K219 Gastro-esophageal reflux disease without esophagitis: Secondary | ICD-10-CM | POA: Diagnosis not present

## 2022-10-05 DIAGNOSIS — M25561 Pain in right knee: Secondary | ICD-10-CM | POA: Diagnosis not present

## 2022-10-05 DIAGNOSIS — Z79899 Other long term (current) drug therapy: Secondary | ICD-10-CM | POA: Diagnosis not present

## 2022-10-05 DIAGNOSIS — M4800 Spinal stenosis, site unspecified: Secondary | ICD-10-CM | POA: Diagnosis not present

## 2022-10-05 DIAGNOSIS — M1990 Unspecified osteoarthritis, unspecified site: Secondary | ICD-10-CM | POA: Diagnosis not present

## 2022-12-03 DIAGNOSIS — Z1231 Encounter for screening mammogram for malignant neoplasm of breast: Secondary | ICD-10-CM | POA: Diagnosis not present

## 2022-12-28 DIAGNOSIS — K1370 Unspecified lesions of oral mucosa: Secondary | ICD-10-CM | POA: Diagnosis not present

## 2022-12-31 ENCOUNTER — Ambulatory Visit (INDEPENDENT_AMBULATORY_CARE_PROVIDER_SITE_OTHER): Payer: Medicare HMO | Admitting: Physician Assistant

## 2022-12-31 ENCOUNTER — Other Ambulatory Visit: Payer: Self-pay

## 2022-12-31 ENCOUNTER — Encounter: Payer: Self-pay | Admitting: Physician Assistant

## 2022-12-31 DIAGNOSIS — M1711 Unilateral primary osteoarthritis, right knee: Secondary | ICD-10-CM

## 2022-12-31 DIAGNOSIS — Z96642 Presence of left artificial hip joint: Secondary | ICD-10-CM | POA: Diagnosis not present

## 2022-12-31 MED ORDER — LIDOCAINE HCL 1 % IJ SOLN
3.0000 mL | INTRAMUSCULAR | Status: AC | PRN
  Administered 2022-12-31: 3 mL

## 2022-12-31 MED ORDER — METHYLPREDNISOLONE ACETATE 40 MG/ML IJ SUSP
40.0000 mg | INTRAMUSCULAR | Status: AC | PRN
  Administered 2022-12-31: 40 mg via INTRA_ARTICULAR

## 2022-12-31 NOTE — Progress Notes (Signed)
Office Visit Note   Patient: [name]           Date of Birth: January 26, 1946           MRN: [id number] Visit Date: 12/31/2022              Requested by: Carrie Reichmann, DO [address] [place],  [place] [address] PCP: Carrie Reichmann, DO   Assessment & Plan: Visit Diagnoses:  1. Status post total replacement of left hip   2. Primary osteoarthritis of right knee     Plan:  We will have her return in March for 1 year follow-up left total hip arthroplasty.  She will work on IT band stretching exercises reviewed with her.  In regards to her right knee osteoarthritis advised she can have injections with cortisone every 3 months.  At return visit we need an AP and lateral view of right knee.  Also obtain an AP pelvis radiograph and 1 year postop.  Questions were encouraged and answered at length.  Follow-Up Instructions: Return in about 6 months (around 06/30/2023).   Orders:  Orders Placed This Encounter  Procedures   Large Joint Inj   XR HIP UNILAT W OR W/O PELVIS 2-3 VIEWS LEFT   No orders of the defined types were placed in this encounter.     Procedures: Large Joint Inj on 12/31/2022 8:43 AM Indications: pain Details: 22 G 1.5 in needle, anterolateral approach  Arthrogram: No  Medications: 3 mL lidocaine 1 %; 40 mg methylPREDNISolone acetate 40 MG/ML Outcome: tolerated well, no immediate complications Procedure, treatment alternatives, risks and benefits explained, specific risks discussed. Consent was given by the patient. Immediately prior to procedure a time out was called to verify the correct patient, procedure, equipment, support staff and site/side marked as required. Patient was prepped and draped in the usual sterile fashion.       Clinical Data: No additional findings.   Subjective: Chief Complaint  Patient presents with   Left Hip - Follow-up    HPI [name] returns today 6 weeks status post left total hip arthroplasty.  She states her  left hip is doing well.  States she has no concerns in regards to the left hip.  Denies any pain. She does ask about her right knee which she has known osteoarthritis.  Last injection was given 09/03/2018 for this cortisone injection which she states gave her good relief until he got extremely hot and she had to use the air in her home.  Notes that air conditioning in the home or vehicle causing rates pain in her knee otherwise she does not have a lot of pain.  She has had no injury to the knee.  She is nondiabetic.  Denies any fevers or chills.  Review of Systems  Constitutional:  Negative for chills and fever.     Objective: Vital Signs: There were no vitals taken for this visit.  Physical Exam Constitutional:      Appearance: She is normal weight. She is not ill-appearing or diaphoretic.  Pulmonary:     Effort: Pulmonary effort is normal.  Neurological:     Mental Status: She is alert and oriented to person, place, and time.  Psychiatric:        Mood and Affect: Mood normal.     Ortho Exam Bilateral hips: Good range of motion both hips actively and passively without pain.  Tenderness left greater than right trochanteric region.  Left hip surgical incisions healing  well no signs of infection or dehiscence.  Right knee full extension full flexion.  Tenderness medial joint line.  No abnormal warmth erythema or effusion.   Specialty Comments:  No specialty comments available.  Imaging: No results found.   PMFS History: Patient Active Problem List   Diagnosis Date Noted   Status post total replacement of left hip 06/22/2022   Carotid artery stenosis 09/01/2021   Asymptomatic carotid artery stenosis without infarction, right 09/01/2021   Low back pain 12/28/2020   Bilateral primary osteoarthritis of knee 10/29/2019   Past Medical History:  Diagnosis Date   Arthritis    "all over" (07/25/2012)   Asthma    Carotid artery disease (HCC)    Environmental allergies     Fibromyalgia    GERD (gastroesophageal reflux disease)    Hyperlipidemia    Hypertension    Hypothyroidism    Peripheral vascular disease (HCC)    Post-menopausal    on HRT x 25 years    Family History  Problem Relation Age of Onset   Heart failure Mother    Hypertension Father    Hypertension Sister    Hypertension Brother    Hypertension Brother    Heart disease Brother     Past Surgical History:  Procedure Laterality Date   ABDOMINAL HYSTERECTOMY  ~ 1977   ANTERIOR CERVICAL DECOMPRESSION/DISCECTOMY FUSION 4 LEVELS N/A 07/23/2012   Procedure: ANTERIOR CERVICAL DECOMPRESSION/DISCECTOMY FUSION 3 LEVELS;  Surgeon: Carrie Hero, MD;  Location: [facility] OR;  Service: Orthopedics;  Laterality: N/A;  Anterior cervical decompression fusion, cervical 3-4, cervical 4-5, cervical 5-6 with instrumentation and allograft.   ENDARTERECTOMY Right 09/01/2021   Procedure: RIGHT CAROTID ENDARTERECTOMY;  Surgeon: Carrie Libman, MD;  Location: [facility] OR;  Service: Vascular;  Laterality: Right;   FEMORAL ARTERY STENT Bilateral 2011   LUMBAR LAMINECTOMY/DECOMPRESSION MICRODISCECTOMY N/A 03/22/2021   Procedure: LUMBAR 4 - LUMBAR 5 DECOMPRESSION;  Surgeon: Carrie Bamberg, MD;  Location: [facility] OR;  Service: Orthopedics;  Laterality: N/A;   PATCH ANGIOPLASTY Right 09/01/2021   Procedure: PATCH ANGIOPLASTY OF RIGHT CAROTID ARTERY USING XENOSURE BOVINE PERICARDIUM PATCH;  Surgeon: Carrie Libman, MD;  Location: [facility] OR;  Service: Vascular;  Laterality: Right;   POSTERIOR CERVICAL FUSION/FORAMINOTOMY N/A 07/24/2012   Procedure: POSTERIOR CERVICAL FUSION/FORAMINOTOMY LEVEL 5;  Surgeon: Carrie Hero, MD;  Location: [facility] OR;  Service: Orthopedics;  Laterality: N/A;  Posterior cervical decompression, cervical 6-7, C7-T1, T1-T2. Cervical fusion cervical 3-4, cervical 4-5, cervical 5-6, cervical 6-7, cervical 7-T1 with allograft, local autograft.   POSTERIOR FUSION CERVICAL SPINE  07/24/2012   C3-T2 posterior fusion  with instrumentation and allograft, C6-T2 decompression)    TONSILLECTOMY  1960's?   TOTAL HIP ARTHROPLASTY Left 06/22/2022   Procedure: LEFT TOTAL HIP ARTHROPLASTY ANTERIOR APPROACH;  Surgeon: Carrie Hitch, MD;  Location: [facility] OR[facility];  Service: Orthopedics;  Laterality: Left;   Social History   Occupational History   Not on file  Tobacco Use   Smoking status: Former    Current packs/day: 0.00    Average packs/day: 0.3 packs/day for 30.0 years (7.5 ttl pk-yrs)    Types: Cigarettes    Start date: 12/16/1990    Quit date: 12/15/2020    Years since quitting: 2.0   Smokeless tobacco: Never  Vaping Use   Vaping status: Never Used  Substance and Sexual Activity   Alcohol use: Not Currently   Drug use: No   Sexual activity: Yes

## 2023-01-29 DIAGNOSIS — K130 Diseases of lips: Secondary | ICD-10-CM | POA: Diagnosis not present

## 2023-02-25 ENCOUNTER — Ambulatory Visit: Payer: Self-pay | Admitting: Cardiology

## 2023-03-01 ENCOUNTER — Ambulatory Visit: Payer: PRIVATE HEALTH INSURANCE | Admitting: Internal Medicine

## 2023-03-05 DIAGNOSIS — Z79899 Other long term (current) drug therapy: Secondary | ICD-10-CM | POA: Diagnosis not present

## 2023-03-05 DIAGNOSIS — M1990 Unspecified osteoarthritis, unspecified site: Secondary | ICD-10-CM | POA: Diagnosis not present

## 2023-03-05 DIAGNOSIS — M4800 Spinal stenosis, site unspecified: Secondary | ICD-10-CM | POA: Diagnosis not present

## 2023-03-05 DIAGNOSIS — K219 Gastro-esophageal reflux disease without esophagitis: Secondary | ICD-10-CM | POA: Diagnosis not present

## 2023-03-05 DIAGNOSIS — M797 Fibromyalgia: Secondary | ICD-10-CM | POA: Diagnosis not present

## 2023-03-05 DIAGNOSIS — M79642 Pain in left hand: Secondary | ICD-10-CM | POA: Diagnosis not present

## 2023-03-05 DIAGNOSIS — L4050 Arthropathic psoriasis, unspecified: Secondary | ICD-10-CM | POA: Diagnosis not present

## 2023-03-05 DIAGNOSIS — M79641 Pain in right hand: Secondary | ICD-10-CM | POA: Diagnosis not present

## 2023-03-05 DIAGNOSIS — M25531 Pain in right wrist: Secondary | ICD-10-CM | POA: Diagnosis not present

## 2023-03-18 ENCOUNTER — Other Ambulatory Visit: Payer: Self-pay | Admitting: *Deleted

## 2023-03-18 DIAGNOSIS — I6523 Occlusion and stenosis of bilateral carotid arteries: Secondary | ICD-10-CM

## 2023-03-29 DIAGNOSIS — E7800 Pure hypercholesterolemia, unspecified: Secondary | ICD-10-CM | POA: Diagnosis not present

## 2023-03-29 DIAGNOSIS — K219 Gastro-esophageal reflux disease without esophagitis: Secondary | ICD-10-CM | POA: Diagnosis not present

## 2023-03-29 DIAGNOSIS — E559 Vitamin D deficiency, unspecified: Secondary | ICD-10-CM | POA: Diagnosis not present

## 2023-03-29 DIAGNOSIS — Z79899 Other long term (current) drug therapy: Secondary | ICD-10-CM | POA: Diagnosis not present

## 2023-03-29 DIAGNOSIS — E890 Postprocedural hypothyroidism: Secondary | ICD-10-CM | POA: Diagnosis not present

## 2023-03-29 DIAGNOSIS — R7309 Other abnormal glucose: Secondary | ICD-10-CM | POA: Diagnosis not present

## 2023-04-01 ENCOUNTER — Ambulatory Visit (INDEPENDENT_AMBULATORY_CARE_PROVIDER_SITE_OTHER): Payer: Medicare HMO | Admitting: Physician Assistant

## 2023-04-01 ENCOUNTER — Ambulatory Visit (HOSPITAL_COMMUNITY)
Admission: RE | Admit: 2023-04-01 | Discharge: 2023-04-01 | Disposition: A | Discharge status: Home / Self Care | Payer: Medicare HMO | Source: Ambulatory Visit | Attending: Surgery | Admitting: Surgery

## 2023-04-01 ENCOUNTER — Encounter: Payer: Self-pay | Admitting: Physician Assistant

## 2023-04-01 VITALS — BP 129/72 | HR 73 | Temp 98.0°F | Resp 18 | Ht 67.5 in | Wt 143.9 lb

## 2023-04-01 DIAGNOSIS — I6523 Occlusion and stenosis of bilateral carotid arteries: Secondary | ICD-10-CM | POA: Insufficient documentation

## 2023-04-01 DIAGNOSIS — I739 Peripheral vascular disease, unspecified: Secondary | ICD-10-CM

## 2023-04-01 NOTE — Progress Notes (Signed)
Office Note     CC:  follow up Requesting Provider:  Irena Reichmann, DO  HPI: [name] is a 77 y.o. (15-Feb-1946) female who presents for routine follow up of carotid artery stenosis. She is s/p right CEA with bovine pericardial patch in May of 2023 by Dr. Myra Gianotti. This was performed for high grade asymptomatic stenosis. She has no history of TIA or stroke. Today she denies any visual changes including amaurosis, no slurred speech, facial drooping, unilateral upper or lower extremity weakness or numbness. She says she finally has normal feeling in her neck along her incision line.  She has history of BLE SFA angioplasties in 2012. She has been without any lower extremity symptoms. Today she denies any pain on ambulation or rest. No tissue loss. She says she does have some arthritis in her knees that bothers her at times but does not limit her ambulation. She is medically managed on Repatha and Plavix . Former smoker.   Past Medical History:  Diagnosis Date   Arthritis    "all over" (07/25/2012)   Asthma    Carotid artery disease (HCC)    Environmental allergies    Fibromyalgia    GERD (gastroesophageal reflux disease)    Hyperlipidemia    Hypertension    Hypothyroidism    Peripheral vascular disease (HCC)    Post-menopausal    on HRT x 25 years    Past Surgical History:  Procedure Laterality Date   ABDOMINAL HYSTERECTOMY  ~ 1977   ANTERIOR CERVICAL DECOMPRESSION/DISCECTOMY FUSION 4 LEVELS N/A 07/23/2012   Procedure: ANTERIOR CERVICAL DECOMPRESSION/DISCECTOMY FUSION 3 LEVELS;  Surgeon: Emilee Hero, MD;  Location: [facility] OR;  Service: Orthopedics;  Laterality: N/A;  Anterior cervical decompression fusion, cervical 3-4, cervical 4-5, cervical 5-6 with instrumentation and allograft.   ENDARTERECTOMY Right 09/01/2021   Procedure: RIGHT CAROTID ENDARTERECTOMY;  Surgeon: Nada Libman, MD;  Location: [facility] OR;  Service: Vascular;  Laterality: Right;   FEMORAL ARTERY STENT Bilateral  2011   LUMBAR LAMINECTOMY/DECOMPRESSION MICRODISCECTOMY N/A 03/22/2021   Procedure: LUMBAR 4 - LUMBAR 5 DECOMPRESSION;  Surgeon: Estill Bamberg, MD;  Location: [facility] OR;  Service: Orthopedics;  Laterality: N/A;   PATCH ANGIOPLASTY Right 09/01/2021   Procedure: PATCH ANGIOPLASTY OF RIGHT CAROTID ARTERY USING XENOSURE BOVINE PERICARDIUM PATCH;  Surgeon: Nada Libman, MD;  Location: [facility] OR;  Service: Vascular;  Laterality: Right;   POSTERIOR CERVICAL FUSION/FORAMINOTOMY N/A 07/24/2012   Procedure: POSTERIOR CERVICAL FUSION/FORAMINOTOMY LEVEL 5;  Surgeon: Emilee Hero, MD;  Location: [facility] OR;  Service: Orthopedics;  Laterality: N/A;  Posterior cervical decompression, cervical 6-7, C7-T1, T1-T2. Cervical fusion cervical 3-4, cervical 4-5, cervical 5-6, cervical 6-7, cervical 7-T1 with allograft, local autograft.   POSTERIOR FUSION CERVICAL SPINE  07/24/2012   C3-T2 posterior fusion with instrumentation and allograft, C6-T2 decompression)    TONSILLECTOMY  1960's?   TOTAL HIP ARTHROPLASTY Left 06/22/2022   Procedure: LEFT TOTAL HIP ARTHROPLASTY ANTERIOR APPROACH;  Surgeon: Kathryne Hitch, MD;  Location: [facility];  Service: Orthopedics;  Laterality: Left;    Social History   Socioeconomic History   Marital status: Married    Spouse name: Not on file   Number of children: 1   Years of education: Not on file   Highest education level: Not on file  Occupational History   Not on file  Tobacco Use   Smoking status: Former    Current packs/day: 0.00    Average packs/day: 0.3 packs/day for 30.0 years (7.5 ttl pk-yrs)  Types: Cigarettes    Start date: 12/16/1990    Quit date: 12/15/2020    Years since quitting: 2.2   Smokeless tobacco: Never  Vaping Use   Vaping status: Never Used  Substance and Sexual Activity   Alcohol use: Not Currently   Drug use: No   Sexual activity: Yes  Other Topics Concern   Not on file  Social History Narrative   Not on file   Social Drivers of Health    Financial Resource Strain: Not on file  Food Insecurity: Patient Declined (06/22/2022)   Hunger Vital Sign    Worried About Running Out of Food in the Last Year: Patient declined    Ran Out of Food in the Last Year: Patient declined  Transportation Needs: Patient Declined (06/22/2022)   PRAPARE - Administrator, Civil Service (Medical): Patient declined    Lack of Transportation (Non-Medical): Patient declined  Physical Activity: Not on file  Stress: Not on file  Social Connections: Not on file  Intimate Partner Violence: Patient Declined (06/22/2022)   Humiliation, Afraid, Rape, and Kick questionnaire    Fear of Current or Ex-Partner: Patient declined    Emotionally Abused: Patient declined    Physically Abused: Patient declined    Sexually Abused: Patient declined    Family History  Problem Relation Age of Onset   Heart failure Mother    Hypertension Father    Hypertension Sister    Hypertension Brother    Hypertension Brother    Heart disease Brother     Current Outpatient Medications  Medication Sig Dispense Refill   amitriptyline (ELAVIL) 150 MG tablet Take 150 mg by mouth at bedtime.     amLODipine (NORVASC) 10 MG tablet Take 10 mg by mouth daily.     atorvastatin (LIPITOR) 20 MG tablet Take 20 mg by mouth daily.     cholecalciferol (VITAMIN D3) 25 MCG (1000 UNIT) tablet Take 1,000 Units by mouth daily.     clopidogrel (PLAVIX) 75 MG tablet Take 75 mg by mouth daily.     estrogens, conjugated, (PREMARIN) 0.625 MG tablet Take 0.625 mg by mouth daily. Take daily for 21 days then do not take for 7 days.     Fluticasone-Salmeterol (ADVAIR) 100-50 MCG/DOSE AEPB Inhale 1 puff into the lungs 2 (two) times daily as needed (shortness of breath).     folic acid (FOLVITE) 1 MG tablet Take 1 mg by mouth daily.     gentamicin cream (GARAMYCIN) 0.1 % Apply 1 Application topically 3 (three) times daily.     levothyroxine (SYNTHROID) 100 MCG tablet Take 100 mcg by mouth daily  before breakfast.     losartan (COZAAR) 50 MG tablet Take 50 mg by mouth daily.     meloxicam (MOBIC) 15 MG tablet Take 15 mg by mouth daily.     methocarbamol (ROBAXIN) 500 MG tablet Take 1 tablet (500 mg total) by mouth every 6 (six) hours as needed for muscle spasms. 40 tablet 1   methotrexate (RHEUMATREX) 2.5 MG tablet Take 5 mg by mouth 4 (four) times a week. Mon - Thurs     Multiple Vitamin (MULTIVITAMIN WITH MINERALS) TABS tablet Take 1 tablet by mouth daily.     nystatin (MYCOSTATIN) 100000 UNIT/ML suspension Take 5 mLs by mouth 3 (three) times daily.     oxyCODONE (OXY IR/ROXICODONE) 5 MG immediate release tablet Take 1 tablet (5 mg total) by mouth every 6 (six) hours as needed for moderate pain (pain score 4-6).  30 tablet 0   pantoprazole (PROTONIX) 40 MG tablet Take 40 mg by mouth daily.     pregabalin (LYRICA) 50 MG capsule Take 50 mg by mouth 2 (two) times daily.     REPATHA 140 MG/ML SOSY Inject 140 mg into the skin every 14 (fourteen) days.     No current facility-administered medications for this visit.    Allergies  Allergen Reactions   Penicillin G     Childhood Reaction    Pine     Seasonal allergy   Codeine Itching     REVIEW OF SYSTEMS:  [X]  denotes positive finding, [ ]  denotes negative finding Cardiac  Comments:  Chest pain or chest pressure:    Shortness of breath upon exertion:    Short of breath when lying flat:    Irregular heart rhythm:        Vascular    Pain in calf, thigh, or hip brought on by ambulation:    Pain in feet at night that wakes you up from your sleep:     Blood clot in your veins:    Leg swelling:         Pulmonary    Oxygen at home:    Productive cough:     Wheezing:         Neurologic    Sudden weakness in arms or legs:     Sudden numbness in arms or legs:     Sudden onset of difficulty speaking or slurred speech:    Temporary loss of vision in one eye:     Problems with dizziness:         Gastrointestinal    Blood in  stool:     Vomited blood:         Genitourinary    Burning when urinating:     Blood in urine:        Psychiatric    Major depression:         Hematologic    Bleeding problems:    Problems with blood clotting too easily:        Skin    Rashes or ulcers:        Constitutional    Fever or chills:      PHYSICAL EXAMINATION:  Vitals:   04/01/23 0803 04/01/23 0806  BP: 133/74 129/72  Pulse: 73   Resp: 18   Temp: 98 F (36.7 C)   TempSrc: Temporal   SpO2: 94%   Weight: 143 lb 14.4 oz (65.3 kg)   Height: 5' 7.5" (1.715 m)     General:  WDWN in NAD; very pleasant, vital signs documented above Gait: Normal HENT: WNL, normocephalic Pulmonary: normal non-labored breathing without wheezing Cardiac: regular HR Abdomen: soft Vascular Exam/Pulses: 2+ radial pulses bilaterally, 2+ femoral, 2+ popliteal, 2+ Dp pulses bilaterally. Feet warm and well perfused Extremities: without ischemic changes, without Gangrene , without cellulitis; without open wounds;  Musculoskeletal: no muscle wasting or atrophy  Neurologic: A&O X 3 Psychiatric:  The pt has Normal affect.   Non-Invasive Vascular Imaging:   VAS US Carotid: Summary:  Right Carotid: Velocities in the right ICA are consistent with a 1-39% stenosis.   Left Carotid: Velocities in the left ICA are consistent with a 40-59% stenosis.   Vertebrals: Bilateral vertebral arteries demonstrate antegrade flow.  Subclavians: Normal flow hemodynamics were seen in bilateral subclavian arteries.    ASSESSMENT/PLAN:: 77 y.o. female here for follow up of carotid artery stenosis. She is s/p right  CEA with bovine pericardial patch in May of 2023 by Dr. Myra Gianotti. This was performed for high grade asymptomatic stenosis. She has no history of TIA or stroke. She is without any associated TIA or Stroke like symptoms. No claudication, rest pain or tissue loss.  - Duplex today shows right ICA with 1-39% ICA stenosis, left ICA with 40-59% ICA  stenosis. This has increased slightly from last duplex 1 year ago. Bilateral vertebral and subclavian arteries patent with normal flow. -Continue Aspirin and Plavix - Reviewed signs and symptoms of TIA or stroke and she understands should this occur to seek immediate medical attention - Follow up in 1 year with carotid duplex    Graceann Congress, PA-C [facility] [phone]  On call MD:   Karin Lieu

## 2023-04-04 ENCOUNTER — Other Ambulatory Visit: Payer: Self-pay

## 2023-04-04 DIAGNOSIS — I6523 Occlusion and stenosis of bilateral carotid arteries: Secondary | ICD-10-CM

## 2023-04-05 DIAGNOSIS — E890 Postprocedural hypothyroidism: Secondary | ICD-10-CM | POA: Diagnosis not present

## 2023-04-05 DIAGNOSIS — I739 Peripheral vascular disease, unspecified: Secondary | ICD-10-CM | POA: Diagnosis not present

## 2023-04-05 DIAGNOSIS — M51369 Other intervertebral disc degeneration, lumbar region without mention of lumbar back pain or lower extremity pain: Secondary | ICD-10-CM | POA: Diagnosis not present

## 2023-04-05 DIAGNOSIS — M4800 Spinal stenosis, site unspecified: Secondary | ICD-10-CM | POA: Diagnosis not present

## 2023-04-05 DIAGNOSIS — L4050 Arthropathic psoriasis, unspecified: Secondary | ICD-10-CM | POA: Diagnosis not present

## 2023-04-05 DIAGNOSIS — J42 Unspecified chronic bronchitis: Secondary | ICD-10-CM | POA: Diagnosis not present

## 2023-04-05 DIAGNOSIS — I10 Essential (primary) hypertension: Secondary | ICD-10-CM | POA: Diagnosis not present

## 2023-04-05 DIAGNOSIS — M797 Fibromyalgia: Secondary | ICD-10-CM | POA: Diagnosis not present

## 2023-04-05 DIAGNOSIS — M1990 Unspecified osteoarthritis, unspecified site: Secondary | ICD-10-CM | POA: Diagnosis not present

## 2023-05-14 ENCOUNTER — Ambulatory Visit: Payer: Medicare HMO | Admitting: Cardiology

## 2023-06-10 DIAGNOSIS — M1990 Unspecified osteoarthritis, unspecified site: Secondary | ICD-10-CM | POA: Diagnosis not present

## 2023-06-10 DIAGNOSIS — Z79899 Other long term (current) drug therapy: Secondary | ICD-10-CM | POA: Diagnosis not present

## 2023-06-10 DIAGNOSIS — M797 Fibromyalgia: Secondary | ICD-10-CM | POA: Diagnosis not present

## 2023-06-10 DIAGNOSIS — L4050 Arthropathic psoriasis, unspecified: Secondary | ICD-10-CM | POA: Diagnosis not present

## 2023-06-10 DIAGNOSIS — M4800 Spinal stenosis, site unspecified: Secondary | ICD-10-CM | POA: Diagnosis not present

## 2023-06-10 DIAGNOSIS — M79641 Pain in right hand: Secondary | ICD-10-CM | POA: Diagnosis not present

## 2023-06-10 DIAGNOSIS — K219 Gastro-esophageal reflux disease without esophagitis: Secondary | ICD-10-CM | POA: Diagnosis not present

## 2023-06-12 ENCOUNTER — Ambulatory Visit: Payer: Medicare HMO | Admitting: Orthopaedic Surgery

## 2023-06-13 DIAGNOSIS — K134 Granuloma and granuloma-like lesions of oral mucosa: Secondary | ICD-10-CM | POA: Diagnosis not present

## 2023-07-01 ENCOUNTER — Encounter: Payer: Self-pay | Admitting: Orthopaedic Surgery

## 2023-07-01 ENCOUNTER — Other Ambulatory Visit (INDEPENDENT_AMBULATORY_CARE_PROVIDER_SITE_OTHER): Payer: Self-pay

## 2023-07-01 ENCOUNTER — Ambulatory Visit (INDEPENDENT_AMBULATORY_CARE_PROVIDER_SITE_OTHER): Payer: Medicare HMO | Admitting: Orthopaedic Surgery

## 2023-07-01 DIAGNOSIS — Z96642 Presence of left artificial hip joint: Secondary | ICD-10-CM

## 2023-07-01 DIAGNOSIS — G8929 Other chronic pain: Secondary | ICD-10-CM

## 2023-07-01 DIAGNOSIS — M25561 Pain in right knee: Secondary | ICD-10-CM | POA: Diagnosis not present

## 2023-07-01 MED ORDER — METHYLPREDNISOLONE ACETATE 40 MG/ML IJ SUSP
40.0000 mg | INTRAMUSCULAR | Status: AC | PRN
  Administered 2023-07-01: 40 mg via INTRA_ARTICULAR

## 2023-07-01 MED ORDER — LIDOCAINE HCL 1 % IJ SOLN
3.0000 mL | INTRAMUSCULAR | Status: AC | PRN
  Administered 2023-07-01: 3 mL

## 2023-07-01 NOTE — Progress Notes (Signed)
 The patient is well-known to [place].  She is just over a year out from her left total hip arthroplasty to treat significant left hip arthritis.  She does have arthritic right knee and would like to have a steroid injection in her right knee today.  She has had no acute or interval change in her medical status.  It has been 6 months since she has had a steroid injection in the right knee.  She is walking without assistive device.  She is an active 78 year old female.  Examination of her left hip today shows it moves smoothly and fluidly.  Surprisingly there is a seroma that is still present after year.  I was able to aspirate at least 90 cc of clear fluid from this area which completely decompressed it.  I then placed a steroid injection in her right knee that she tolerated well.  She is not interested in any surgery at this standpoint.  If things worsen and she needs another injection in her knee she knows to let us know.  If she does develop a reoccurrence of the seroma I would like to see her back as well for her left hip.  All questions and concerns were answered and addressed.    Procedure Note  Patient: [name]             Date of Birth: 11/17/1945           MRN: [id number]             Visit Date: 07/01/2023  Procedures: Visit Diagnoses:  1. History of left hip replacement   2. Chronic pain of right knee     Large Joint Inj: R knee on 07/01/2023 8:35 AM Indications: diagnostic evaluation and pain Details: 22 G 1.5 in needle, superolateral approach  Arthrogram: No  Medications: 3 mL lidocaine 1 %; 40 mg methylPREDNISolone acetate 40 MG/ML Outcome: tolerated well, no immediate complications Procedure, treatment alternatives, risks and benefits explained, specific risks discussed. Consent was given by the patient. Immediately prior to procedure a time out was called to verify the correct patient, procedure, equipment, support staff and site/side marked as required. Patient was prepped and  draped in the usual sterile fashion.

## 2023-07-29 DIAGNOSIS — L4050 Arthropathic psoriasis, unspecified: Secondary | ICD-10-CM | POA: Diagnosis not present

## 2023-07-29 DIAGNOSIS — M797 Fibromyalgia: Secondary | ICD-10-CM | POA: Diagnosis not present

## 2023-07-29 DIAGNOSIS — M1990 Unspecified osteoarthritis, unspecified site: Secondary | ICD-10-CM | POA: Diagnosis not present

## 2023-07-29 DIAGNOSIS — M4800 Spinal stenosis, site unspecified: Secondary | ICD-10-CM | POA: Diagnosis not present

## 2023-07-29 DIAGNOSIS — M79641 Pain in right hand: Secondary | ICD-10-CM | POA: Diagnosis not present

## 2023-07-29 DIAGNOSIS — K219 Gastro-esophageal reflux disease without esophagitis: Secondary | ICD-10-CM | POA: Diagnosis not present

## 2023-07-29 DIAGNOSIS — Z79899 Other long term (current) drug therapy: Secondary | ICD-10-CM | POA: Diagnosis not present

## 2023-08-23 DIAGNOSIS — S62101A Fracture of unspecified carpal bone, right wrist, initial encounter for closed fracture: Secondary | ICD-10-CM | POA: Diagnosis not present

## 2023-08-23 DIAGNOSIS — M19031 Primary osteoarthritis, right wrist: Secondary | ICD-10-CM | POA: Diagnosis not present

## 2023-08-23 DIAGNOSIS — S56101A Unspecified injury of flexor muscle, fascia and tendon of right index finger at forearm level, initial encounter: Secondary | ICD-10-CM | POA: Diagnosis not present

## 2023-09-05 ENCOUNTER — Ambulatory Visit: Attending: Cardiology | Admitting: Cardiology

## 2023-09-05 ENCOUNTER — Ambulatory Visit: Admitting: Orthopaedic Surgery

## 2023-09-05 NOTE — Progress Notes (Deleted)
 Cardiology Office Note:  .   Date:  09/05/2023  ID:  [name], DOB 1946-03-02, MRN [id number] PCP: Pete Brand, DO  [facility] Providers Cardiologist:  None { Click to update primary MD,subspecialty MD or APP then REFRESH:1}  History of Present Illness: .   [name] is a 78 y.o. African-American female with peripheral arterial disease, history of bilateral SFA angioplasty in 2012, essential hypertension, hyperlipidemia and asymptomatic carotid stenosis presents for annual visit.  She also has chronic anemia.  Discussed the use of AI scribe software for clinical note transcription with the patient, who gave verbal consent to proceed.  History of Present Illness   Labs      Latest Ref Rng & Units 06/23/2022    3:43 AM 06/12/2022    8:51 AM 09/02/2021    6:00 AM  CBC  WBC 4.0 - 10.5 K/uL 10.5  6.5  8.2   Hemoglobin 12.0 - 15.0 g/dL 9.0  95.2  84.1   Hematocrit 36.0 - 46.0 % 27.4  32.1  28.8   Platelets 150 - 400 K/uL 289  570  236     External Labs:  Care everywhere PCP labs 06/10/2023:  Hb 10.6/HCT 31.4, platelets 440.  Microcytic indicis.  Serum glucose 90 mg, BUN 13, creatinine 0.81, EGFR 75 mL, potassium 4.3, LFTs normal.  Labs 03/29/2023:  Total cholesterol 147, triglycerides 86, HDL 78, LDL 53.  A1c 5.9%.  TSH normal at 0.757.  ROS  ***ROS  Physical Exam:   VS:  There were no vitals taken for this visit.   Wt Readings from Last 3 Encounters:  04/01/23 143 lb 14.4 oz (65.3 kg)  06/22/22 138 lb (62.6 kg)  06/12/22 138 lb (62.6 kg)    ***Physical Exam Studies Reviewed: .    Carotid artery duplex 04/01/2023: Right Carotid: Velocities in the right ICA are consistent with a 1-39% stenosis.  Left Carotid: Velocities in the left ICA are consistent with a 40-59% stenosis.  Vertebrals: Bilateral vertebral arteries demonstrate antegrade flow.  Subclavians: Normal flow hemodynamics were seen in bilateral subclavian  arteries.  EKG:          ***  Medications and allergies    Allergies  Allergen Reactions   Penicillin G     Childhood Reaction    Pine     Seasonal allergy   Codeine Itching     Current Outpatient Medications:    amitriptyline  (ELAVIL ) 150 MG tablet, Take 150 mg by mouth at bedtime., Disp: , Rfl:    amLODipine  (NORVASC ) 10 MG tablet, Take 10 mg by mouth daily., Disp: , Rfl:    atorvastatin  (LIPITOR) 20 MG tablet, Take 20 mg by mouth daily., Disp: , Rfl:    cholecalciferol  (VITAMIN D3) 25 MCG (1000 UNIT) tablet, Take 1,000 Units by mouth daily., Disp: , Rfl:    clopidogrel  (PLAVIX ) 75 MG tablet, Take 75 mg by mouth daily., Disp: , Rfl:    estrogens , conjugated, (PREMARIN ) 0.625 MG tablet, Take 0.625 mg by mouth daily. Take daily for 21 days then do not take for 7 days., Disp: , Rfl:    Fluticasone -Salmeterol (ADVAIR ) 100-50 MCG/DOSE AEPB, Inhale 1 puff into the lungs 2 (two) times daily as needed (shortness of breath)., Disp: , Rfl:    folic acid  (FOLVITE ) 1 MG tablet, Take 1 mg by mouth daily., Disp: , Rfl:    gentamicin cream (GARAMYCIN) 0.1 %, Apply 1 Application topically 3 (three) times daily., Disp: , Rfl:  levothyroxine  (SYNTHROID ) 100 MCG tablet, Take 100 mcg by mouth daily before breakfast., Disp: , Rfl:    losartan  (COZAAR ) 50 MG tablet, Take 50 mg by mouth daily., Disp: , Rfl:    meloxicam  (MOBIC ) 15 MG tablet, Take 15 mg by mouth daily., Disp: , Rfl:    methocarbamol  (ROBAXIN ) 500 MG tablet, Take 1 tablet (500 mg total) by mouth every 6 (six) hours as needed for muscle spasms., Disp: 40 tablet, Rfl: 1   methotrexate (RHEUMATREX) 2.5 MG tablet, Take 5 mg by mouth 4 (four) times a week. Mon - Thurs, Disp: , Rfl:    Multiple Vitamin (MULTIVITAMIN WITH MINERALS) TABS tablet, Take 1 tablet by mouth daily., Disp: , Rfl:    nystatin  (MYCOSTATIN ) 100000 UNIT/ML suspension, Take 5 mLs by mouth 3 (three) times daily., Disp: , Rfl:    oxyCODONE  (OXY IR/ROXICODONE ) 5 MG immediate release tablet, Take 1  tablet (5 mg total) by mouth every 6 (six) hours as needed for moderate pain (pain score 4-6)., Disp: 30 tablet, Rfl: 0   pantoprazole  (PROTONIX ) 40 MG tablet, Take 40 mg by mouth daily., Disp: , Rfl:    pregabalin  (LYRICA ) 50 MG capsule, Take 50 mg by mouth 2 (two) times daily., Disp: , Rfl:    REPATHA 140 MG/ML SOSY, Inject 140 mg into the skin every 14 (fourteen) days., Disp: , Rfl:    No orders of the defined types were placed in this encounter.    There are no discontinued medications.   ASSESSMENT AND PLAN: .      ICD-10-CM   1. Claudication in peripheral vascular disease (HCC)  I73.9     2. Carotid stenosis, bilateral  I65.23     3. Primary hypertension  I10       Assessment and Plan Assessment & Plan      Signed,  Knox Perl, MD, [facility] 09/05/2023, 4:27 AM [facility] [address] [place], [place] [address] Phone: [phone]. Fax:  [phone]

## 2023-09-06 ENCOUNTER — Encounter: Payer: Self-pay | Admitting: Cardiology

## 2023-09-26 ENCOUNTER — Ambulatory Visit: Attending: General Practice | Admitting: General Practice

## 2023-09-26 ENCOUNTER — Encounter: Payer: Self-pay | Admitting: General Practice

## 2023-09-26 VITALS — BP 132/78 | HR 90 | Ht 67.0 in | Wt 143.8 lb

## 2023-09-26 DIAGNOSIS — R600 Localized edema: Secondary | ICD-10-CM

## 2023-09-26 DIAGNOSIS — I10 Essential (primary) hypertension: Secondary | ICD-10-CM

## 2023-09-26 DIAGNOSIS — E7800 Pure hypercholesterolemia, unspecified: Secondary | ICD-10-CM | POA: Diagnosis not present

## 2023-09-26 DIAGNOSIS — I6521 Occlusion and stenosis of right carotid artery: Secondary | ICD-10-CM

## 2023-09-26 DIAGNOSIS — F17210 Nicotine dependence, cigarettes, uncomplicated: Secondary | ICD-10-CM | POA: Diagnosis not present

## 2023-09-26 DIAGNOSIS — I6523 Occlusion and stenosis of bilateral carotid arteries: Secondary | ICD-10-CM | POA: Diagnosis not present

## 2023-09-26 DIAGNOSIS — I739 Peripheral vascular disease, unspecified: Secondary | ICD-10-CM

## 2023-09-26 NOTE — Patient Instructions (Addendum)
 Medication Instructions:  No medication changes were made during today's visit. *If you need a refill on your cardiac medications before your next appointment, please call your pharmacy*   Lab Work: No labs were ordered during today's visit.  If you have labs (blood work) drawn today and your tests are completely normal, you will receive your results only by: MyChart Message (if you have MyChart) OR A paper copy in the mail If you have any lab test that is abnormal or we need to change your treatment, we will call you to review the results.   Testing/Procedures: No procedures were ordered during today's visit.    Follow-Up: At [facility], you and your health needs are our priority.  As part of our continuing mission to provide you with exceptional heart care, we have created designated Provider Care Teams.  These Care Teams include your primary Cardiologist (physician) and Advanced Practice Providers (APPs -  Physician Assistants and Nurse Practitioners) who all work together to provide you with the care you need, when you need it.  We recommend signing up for the patient portal called MyChart.  Sign up information is provided on this After Visit Summary.  MyChart is used to connect with patients for Virtual Visits (Telemedicine).  Patients are able to view lab/test results, encounter notes, upcoming appointments, etc.  Non-urgent messages can be sent to your provider as well.   To learn more about what you can do with MyChart, go to [email or web address].    Your next appointment:   1-93month(s)  Provider:   Lawana Pray, NP          Other Instructions Maintain your hydration     [employer]  Outlet Store  Performance Legwear for [facility] Mailing Address:  [address];   [address]  [place], [place] [address]  Tel [phone] Fx [phone]     High Quality Legwear for Today's Active Lifestyles Maximum Compression at the ankle.  Compression lessens gradually up the leg.   We manufacture a wide range of compression hosiery for men and women in  different styles, constructions and levels of support.  How Compression Hosiery Works Regulatory affairs officer, [employer]. compression hosiery works by applying graduated pressure to the  muscles and veins in the legs.  When the calf muscle contracts such as during walking  the compression hosiery will "give" and then return to its original position. By doing so  the hosiery is assists your body's circulatory wellness.  The result is increased leg health and vitality.   Maximum Compression at the ankle Compression lessens gradually up the leg  We Offer: Sheer & Opaque Stockings       COLORS:  Nude, black, white and misc. prints Below Knee Thigh High Pantyhose  High Quality Legwear for Today's Active Lifestyles We manufacture a wide range of compression hosiery for men and women in different styles, construction sand levels of support.  Socks:                     Sheer & Opaque   Compression Levels Include:                                  Stockings Men's               Below Knee  8-15 mmHg   Women's         Thigh High                 15-20 mmHg  Unisex             Pantyhose                 20-30 mmHg                                                                      30-40 mmHg  4 Simple Ways to Order   Email  [email or web address] Mail/Email orders are subject to processing and handling charges. Allow 7-10 days for receipt.  Phone [phone]  Please allow 24 hours for return call.   In Person  We recommend calling prior to your visit to confirm store hours as they may change due to holiday, weather, and maintenance.   By Mail When placing an order, please have the following information available. Our representatives are available to assist.     Measurements    THIGH      in.   CALF        in.   ANKLE     in.    Compression  8-15 mmHg 15-20 mmHg**    20-30 mmHg 30-40 mmHg   WOMEN'S MEN'S  Shoe Size Sock Size Shoe Size Sock Size  4 - 5 Small 7.5 and Under Small  5.5 - 7.5 Medium 8 - 10 Medium  8 - 10 Large 10.5 - 12 Large  10.5 and Over X-Large 12.5 and Over X-Large   Knee High Size Chart  Length from CALF MEASUREMENT  floor to bend   in knee. 11 12 13 14 15 16 17 18 19 20  21" 22"  14 S S S S M M L L L XL XL XL  15 S S S M M L L L XL XL XXL XXL  16 S S M M M L L L XL XXL XXL XXL  17 S M M M M L L XL XL XXL XXL XXL  18 M M M M L L L XL XL XXL XXL XXL  19 M M M M L L XL XL XL XXL XXL XXL   Thigh High Circumference Sizing Chart                 S M L XL XXL  ANKLE 6.5 - 8 8 - 9.5 9.5 - 11 11 - 12.5 12.5 - 14  CALF 10.5 - 14.5 11.5 - 15.5 12.5 - 17 13.5 - 17.5 14.5 - 19.5  THIGH 15.5 - 22 17.5 - 24 19.5 - 26 22 - 28 26 - 32  HIP UP TO 40 UP TO 44 UP TO 48' UP TO 52 UP TO 56   Pantyhose Size Chart  Height Petite Medium Tall X-Tall Queen Queen +   Weight Weight Weight Weight Weight Weight  4'11 95-130 135      5'0 95-125 130-145   170-185   5'1 90-120 125-155 160-165  170-195   5'2 90-115 120-145 150-165  170-195   5'3 90-110 115-140 145-165  170-200 200-225  5'4 100-105 110-135 140-160 165 170-200  200-225  5'5 100 105-130 135-160 165 170-200 200-225  5'6  110-125 130-155 160-165 170-200 200-225  5'7  110-120 125-150 155-165 170-200 195-225  5'8   120-145 150-165 170-200 190-225  5'9   125-140 145-170 175-190 185-220  5'10   125-135 140-185  185-215  5'11   130-135 140-185  190-210   PLEASE FOLLOW SALTY 6 HANDOUT THAT IS ATTACHED     PLEASE READ AND FOLLOW ATTACHED  SALTY 6

## 2023-09-26 NOTE — Progress Notes (Signed)
 Cardiology Clinic Note   Patient Name: [name] Date of Encounter: 09/26/2023  Primary Care Provider:  Pete Brand, DO Primary Cardiologist:  None  Patient Profile    [name] 78 year old female presents the clinic today for  evaluation of her lower extremity swelling.  Past Medical History    Past Medical History:  Diagnosis Date   Arthritis    all over (07/25/2012)   Asthma    Carotid artery disease (HCC)    Environmental allergies    Fibromyalgia    GERD (gastroesophageal reflux disease)    Hyperlipidemia    Hypertension    Hypothyroidism    Peripheral vascular disease (HCC)    Post-menopausal    on HRT x 25 years   Past Surgical History:  Procedure Laterality Date   ABDOMINAL HYSTERECTOMY  ~ 1977   ANTERIOR CERVICAL DECOMPRESSION/DISCECTOMY FUSION 4 LEVELS N/A 07/23/2012   Procedure: ANTERIOR CERVICAL DECOMPRESSION/DISCECTOMY FUSION 3 LEVELS;  Surgeon: Estevan Helper, MD;  Location: [facility] OR;  Service: Orthopedics;  Laterality: N/A;  Anterior cervical decompression fusion, cervical 3-4, cervical 4-5, cervical 5-6 with instrumentation and allograft.   ENDARTERECTOMY Right 09/01/2021   Procedure: RIGHT CAROTID ENDARTERECTOMY;  Surgeon: Margherita Shell, MD;  Location: [facility] OR;  Service: Vascular;  Laterality: Right;   FEMORAL ARTERY STENT Bilateral 2011   LUMBAR LAMINECTOMY/DECOMPRESSION MICRODISCECTOMY N/A 03/22/2021   Procedure: LUMBAR 4 - LUMBAR 5 DECOMPRESSION;  Surgeon: Virl Grimes, MD;  Location: [facility] OR;  Service: Orthopedics;  Laterality: N/A;   PATCH ANGIOPLASTY Right 09/01/2021   Procedure: PATCH ANGIOPLASTY OF RIGHT CAROTID ARTERY USING XENOSURE BOVINE PERICARDIUM PATCH;  Surgeon: Margherita Shell, MD;  Location: [facility] OR;  Service: Vascular;  Laterality: Right;   POSTERIOR CERVICAL FUSION/FORAMINOTOMY N/A 07/24/2012   Procedure: POSTERIOR CERVICAL FUSION/FORAMINOTOMY LEVEL 5;  Surgeon: Estevan Helper, MD;  Location: [facility] OR;  Service:  Orthopedics;  Laterality: N/A;  Posterior cervical decompression, cervical 6-7, C7-T1, T1-T2. Cervical fusion cervical 3-4, cervical 4-5, cervical 5-6, cervical 6-7, cervical 7-T1 with allograft, local autograft.   POSTERIOR FUSION CERVICAL SPINE  07/24/2012   C3-T2 posterior fusion with instrumentation and allograft, C6-T2 decompression)    TONSILLECTOMY  1960's?   TOTAL HIP ARTHROPLASTY Left 06/22/2022   Procedure: LEFT TOTAL HIP ARTHROPLASTY ANTERIOR APPROACH;  Surgeon: Arnie Lao, MD;  Location: [facility] ORS;  Service: Orthopedics;  Laterality: Left;    Allergies  Allergies  Allergen Reactions   Penicillin G     Childhood Reaction    Pine     Seasonal allergy   Codeine Itching    History of Present Illness    [name] has a PMH of peripheral arterial disease, bilateral SFA angioplasty in 2012, HTN, HLD, tobacco use, and peripheral arterial disease.  She was seen in follow-up by [place] PA-C on 06/20/2020.  During that time she presented for annual follow-up of her carotid artery disease and PAD.  She was doing well overall.  She denied chest pain, palpitations, dyspnea, lower extremity swelling, syncope and near syncope.  She continued to have leg pain in her legs with walking.  She noted pain in her left hip and knees.  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  She reported that her leg pain was worse with colder temperatures.  She noted that her pain was also more evident in the morning when she would get out of the bed  in the morning.  This was attributed to arthritic type pain.  She continued to smoke 2 cigarettes/day.  She was walking 3-5 times per week for approximately 1 hour.  She noted that her knee pain improved with movement.  She presents to the clinic today for evaluation and states she has noticed increased lower extremity swelling over the last week.  She reports some dietary  indiscretion.  She has not been over hydrating.  She notes some dizziness 2 months ago but no recent symptoms.  She did have a left hip replacement.  We reviewed the importance of elevating her lower extremities, dependent edema, low-sodium diet, and lower extremity support stockings.  I explained that at this time I do not feel that she needs diuretic therapy.  I will treat this conservatively and plan follow-up in 1 to 2 months.  Today she denies chest pain, shortness of breath,  fatigue, palpitations, melena, hematuria, hemoptysis, diaphoresis, weakness, presyncope, syncope, orthopnea, and PND.    Home Medications    Prior to Admission medications   Medication Sig Start Date End Date Taking? Authorizing Provider  amitriptyline  (ELAVIL ) 150 MG tablet Take 150 mg by mouth at bedtime.    [provider]  amLODipine  (NORVASC ) 10 MG tablet Take 10 mg by mouth daily.    [provider]  atorvastatin  (LIPITOR) 20 MG tablet Take 20 mg by mouth daily. 05/26/21   [provider]  cholecalciferol  (VITAMIN D3) 25 MCG (1000 UNIT) tablet Take 1,000 Units by mouth daily.    [provider]  clopidogrel  (PLAVIX ) 75 MG tablet Take 75 mg by mouth daily.    [provider]  estrogens , conjugated, (PREMARIN ) 0.625 MG tablet Take 0.625 mg by mouth daily. Take daily for 21 days then do not take for 7 days.    [provider]  Fluticasone -Salmeterol (ADVAIR ) 100-50 MCG/DOSE AEPB Inhale 1 puff into the lungs 2 (two) times daily as needed (shortness of breath).    [provider]  folic acid  (FOLVITE ) 1 MG tablet Take 1 mg by mouth daily. 07/05/21   [provider]  gentamicin cream (GARAMYCIN) 0.1 % Apply 1 Application topically 3 (three) times daily. 05/30/22   [provider]  levothyroxine  (SYNTHROID ) 100 MCG tablet Take 100 mcg by mouth daily before breakfast.    [provider]  losartan  (COZAAR ) 50 MG tablet Take 50 mg by  mouth daily.    [provider]  meloxicam  (MOBIC ) 15 MG tablet Take 15 mg by mouth daily. 08/03/21   [provider]  methocarbamol  (ROBAXIN ) 500 MG tablet Take 1 tablet (500 mg total) by mouth every 6 (six) hours as needed for muscle spasms. 06/23/22   Arnie Lao, MD  methotrexate (RHEUMATREX) 2.5 MG tablet Take 5 mg by mouth 4 (four) times a week. Mon - Thurs 07/05/21   [provider]  Multiple Vitamin (MULTIVITAMIN WITH MINERALS) TABS tablet Take 1 tablet by mouth daily.    [provider]  nystatin  (MYCOSTATIN ) 100000 UNIT/ML suspension Take 5 mLs by mouth 3 (three) times daily. 06/04/22   [provider]  oxyCODONE  (OXY IR/ROXICODONE ) 5 MG immediate release tablet Take 1 tablet (5 mg total) by mouth every 6 (six) hours as needed for moderate pain (pain score 4-6). 07/05/22   Arnie Lao, MD  pantoprazole  (PROTONIX ) 40 MG tablet Take 40 mg by mouth daily.    [provider]  pregabalin  (LYRICA ) 50 MG capsule Take 50 mg by mouth 2 (  two) times daily. 01/26/21   [provider]  REPATHA 140 MG/ML SOSY Inject 140 mg into the skin every 14 (fourteen) days. 07/24/21   [provider]    Family History    Family History  Problem Relation Age of Onset   Heart failure Mother    Hypertension Father    Hypertension Sister    Hypertension Brother    Hypertension Brother    Heart disease Brother    She indicated that her mother is deceased. She indicated that her father is deceased. She indicated that her sister is deceased. She indicated that both of her brothers are deceased.  Social History    Social History   Socioeconomic History   Marital status: Married    Spouse name: Not on file   Number of children: 1   Years of education: Not on file   Highest education level: Not on file  Occupational History   Not on file  Tobacco Use   Smoking status: Former    Current packs/day: 0.00    Average  packs/day: 0.3 packs/day for 30.0 years (7.5 ttl pk-yrs)    Types: Cigarettes    Start date: 12/16/1990    Quit date: 12/15/2020    Years since quitting: 2.7   Smokeless tobacco: Never  Vaping Use   Vaping status: Never Used  Substance and Sexual Activity   Alcohol use: Not Currently   Drug use: No   Sexual activity: Yes  Other Topics Concern   Not on file  Social History Narrative   Not on file   Social Drivers of Health   Financial Resource Strain: Not on file  Food Insecurity: Low Risk  (08/23/2023)   Received from [facility]   Hunger Vital Sign    Within the past 12 months, you worried that your food would run out before you got money to buy more: Never true    Within the past 12 months, the food you bought just didn't last and you didn't have money to get more. : Never true  Transportation Needs: No Transportation Needs (08/23/2023)   Received from [facility]    In the past 12 months, has lack of reliable transportation kept you from medical appointments, meetings, work or from getting things needed for daily living? : No  Physical Activity: Not on file  Stress: Not on file  Social Connections: Not on file  Intimate Partner Violence: Patient Declined (06/22/2022)   Humiliation, Afraid, Rape, and Kick questionnaire    Fear of Current or Ex-Partner: Patient declined    Emotionally Abused: Patient declined    Physically Abused: Patient declined    Sexually Abused: Patient declined     Review of Systems    General:  No chills, fever, night sweats or weight changes.  Cardiovascular:  No chest pain, dyspnea on exertion, edema, orthopnea, palpitations, paroxysmal nocturnal dyspnea. Dermatological: No rash, lesions/masses Respiratory: No cough, dyspnea Urologic: No hematuria, dysuria Abdominal:   No nausea, vomiting, diarrhea, bright red blood per rectum, melena, or hematemesis Neurologic:  No visual changes, wkns, changes in mental status. All other systems  reviewed and are otherwise negative except as noted above.  Physical Exam    VS:  BP 132/78   Pulse 90   Ht 5' 7 (1.702 m)   Wt 143 lb 12.8 oz (65.2 kg)   SpO2 97%   BMI 22.52 kg/m  , BMI Body mass index is 22.52 kg/m. GEN: Well nourished, well developed,  in no acute distress. HEENT: normal. Neck: Supple, no JVD, carotid bruits, or masses. Cardiac: RRR, no murmurs, rubs, or gallops. No clubbing, cyanosis, bilateral lower extremity 1+ pitting edema to midshin.  Radials/DP/PT 2+ and equal bilaterally.  Respiratory:  Respirations regular and unlabored, clear to auscultation bilaterally. GI: Soft, nontender, nondistended, BS + x 4. MS: no deformity or atrophy. Skin: warm and dry, no rash. Neuro:  Strength and sensation are intact. Psych: Normal affect.  Accessory Clinical Findings    Recent Labs: No results found for requested labs within last 365 days.   Recent Lipid Panel    Component Value Date/Time   CHOL 101 09/02/2021 0600   CHOL 104 07/13/2020 0847   TRIG 135 09/02/2021 0600   HDL 63 09/02/2021 0600   HDL 76 07/13/2020 0847   CHOLHDL 1.6 09/02/2021 0600   VLDL 27 09/02/2021 0600   LDLCALC 11 09/02/2021 0600   LDLCALC 15 07/13/2020 0847         ECG personally reviewed by me today- EKG Interpretation Date/Time:  Thursday September 26 2023 13:48:48 EDT Ventricular Rate:  83 PR Interval:  164 QRS Duration:  58 QT Interval:  364 QTC Calculation: 427 R Axis:   18  Text Interpretation: Sinus rhythm with Premature ventricular complexes Confirmed by Lawana Pray [phone]) on 09/26/2023 1:52:58 PM    Carotid ultrasound 04/01/2023   Summary:  Right Carotid: Velocities in the right ICA are consistent with a 1-39%  stenosis.   Left Carotid: Velocities in the left ICA are consistent with a 40-59%  stenosis.   Vertebrals: Bilateral vertebral arteries demonstrate antegrade flow.  Subclavians: Normal flow hemodynamics were seen in bilateral subclavian                arteries.   *See table(s) above for measurements and observations.      Electronically signed by Irvin Mantel on 04/01/2023 at 6:52:14 PM.     Assessment & Plan   1.  Bilateral lower extremity edema-bilateral lower extremity 1+ pitting to mid shin edema.  Weight today 143. Elevate lower extremities when active Lower extremity support stockings-support stockings sheet given Heart healthy low-sodium diet  Hyperlipidemia-LDL 53 on 03/29/23. High-fiber diet Maintain physical activity Continue Repatha, Lipitor  Peripheral arterial disease-denies lower extremity claudication.  Walking regularly. Continue Plavix , atorvastatin , Repatha Order ABIs and lower extremity arterial  Carotid artery disease-denies episodes of presyncope, syncope and lightheadedness.  Status post right carotid endarterectomy. Continue Plavix , atorvastatin , Repatha   Tobacco abuse-continues to smoke 1-2 cigarettes/day.  Smoking cessation strongly recommended. Smoking cessation information provided  Essential hypertension-BP today 132/78. Maintain blood pressure log Continue losartan  Follows with PCP  Disposition: Follow-up with Dr. Berry Bristol or me in 1-2 months.   Chet Cota. Warren Lindahl NP-C     09/26/2023, 2:21 PM [facility] [address] Office [phone] Fax [phone]    I spent 14 minutes examining this patient, reviewing medications, and using patient centered shared decision making involving their cardiac care.   I spent  20 minutes reviewing past medical history,  medications, and prior cardiac tests.

## 2023-10-01 DIAGNOSIS — Z9862 Peripheral vascular angioplasty status: Secondary | ICD-10-CM | POA: Diagnosis not present

## 2023-10-01 DIAGNOSIS — E890 Postprocedural hypothyroidism: Secondary | ICD-10-CM | POA: Diagnosis not present

## 2023-10-01 DIAGNOSIS — E7800 Pure hypercholesterolemia, unspecified: Secondary | ICD-10-CM | POA: Diagnosis not present

## 2023-10-01 DIAGNOSIS — R7303 Prediabetes: Secondary | ICD-10-CM | POA: Diagnosis not present

## 2023-10-01 DIAGNOSIS — I739 Peripheral vascular disease, unspecified: Secondary | ICD-10-CM | POA: Diagnosis not present

## 2023-10-07 ENCOUNTER — Ambulatory Visit (INDEPENDENT_AMBULATORY_CARE_PROVIDER_SITE_OTHER): Admitting: Orthopaedic Surgery

## 2023-10-07 ENCOUNTER — Encounter: Payer: Self-pay | Admitting: Orthopaedic Surgery

## 2023-10-07 ENCOUNTER — Other Ambulatory Visit (INDEPENDENT_AMBULATORY_CARE_PROVIDER_SITE_OTHER): Payer: Self-pay

## 2023-10-07 DIAGNOSIS — M1711 Unilateral primary osteoarthritis, right knee: Secondary | ICD-10-CM | POA: Insufficient documentation

## 2023-10-07 DIAGNOSIS — M25561 Pain in right knee: Secondary | ICD-10-CM

## 2023-10-07 DIAGNOSIS — G8929 Other chronic pain: Secondary | ICD-10-CM | POA: Diagnosis not present

## 2023-10-07 NOTE — Progress Notes (Signed)
 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  This has been well-documented for over 3 years now and has been slowly getting worse.  We have replaced her left hip in the recent past.  She walks with a limp and says her right knee pain is severe and it is daily.  It is detrimentally affecting her mobility, her quality of life and actives daily living.  She has been treated for this for multiple years now with injections.  She says that is no longer help.  She is interested in knee replacement surgery.  She is on Plavix .  I was able to review her medications and past medical history within epic.  Examination of her right knee she has valgus malalignment.  There is significant knee swelling with a moderate effusion.  There is significant leg and foot and ankle swelling as well.  She has painful range of motion of the right knee throughout the arc of motion with significant patellofemoral crepitation and pain.  X-rays of her right knee today show valgus malalignment of that knee.  There is bone-on-bone wear of all 3 compartments.  There are osteophytes in all 3 compartments.  At this point we recommended knee replacement surgery.  I showed her knee replacement model and went over her x-rays.  We described in detail what the surgery involves as well as a thorough discussion of the intraoperative postoperative course and the risks and benefits of surgery.  All question concerns were answered and addressed.  Will work on getting her scheduled for surgery sometime this summer.  She agrees with this.

## 2023-10-10 DIAGNOSIS — I6523 Occlusion and stenosis of bilateral carotid arteries: Secondary | ICD-10-CM | POA: Diagnosis not present

## 2023-10-10 DIAGNOSIS — K219 Gastro-esophageal reflux disease without esophagitis: Secondary | ICD-10-CM | POA: Diagnosis not present

## 2023-10-10 DIAGNOSIS — Z0000 Encounter for general adult medical examination without abnormal findings: Secondary | ICD-10-CM | POA: Diagnosis not present

## 2023-10-10 DIAGNOSIS — E7800 Pure hypercholesterolemia, unspecified: Secondary | ICD-10-CM | POA: Diagnosis not present

## 2023-10-10 DIAGNOSIS — T300 Burn of unspecified body region, unspecified degree: Secondary | ICD-10-CM | POA: Diagnosis not present

## 2023-10-10 DIAGNOSIS — I739 Peripheral vascular disease, unspecified: Secondary | ICD-10-CM | POA: Diagnosis not present

## 2023-10-10 DIAGNOSIS — M1990 Unspecified osteoarthritis, unspecified site: Secondary | ICD-10-CM | POA: Diagnosis not present

## 2023-10-10 DIAGNOSIS — R7303 Prediabetes: Secondary | ICD-10-CM | POA: Diagnosis not present

## 2023-10-10 DIAGNOSIS — R600 Localized edema: Secondary | ICD-10-CM | POA: Diagnosis not present

## 2023-10-24 ENCOUNTER — Telehealth: Payer: Self-pay

## 2023-10-24 DIAGNOSIS — M1990 Unspecified osteoarthritis, unspecified site: Secondary | ICD-10-CM | POA: Diagnosis not present

## 2023-10-24 DIAGNOSIS — L4050 Arthropathic psoriasis, unspecified: Secondary | ICD-10-CM | POA: Diagnosis not present

## 2023-10-24 DIAGNOSIS — R2689 Other abnormalities of gait and mobility: Secondary | ICD-10-CM | POA: Diagnosis not present

## 2023-10-24 DIAGNOSIS — R600 Localized edema: Secondary | ICD-10-CM | POA: Diagnosis not present

## 2023-10-24 DIAGNOSIS — I739 Peripheral vascular disease, unspecified: Secondary | ICD-10-CM | POA: Diagnosis not present

## 2023-10-24 NOTE — Telephone Encounter (Signed)
 I called patient to discuss scheduling right TKA.  Left voice mail message for patient to return my call.

## 2023-10-30 DIAGNOSIS — R600 Localized edema: Secondary | ICD-10-CM | POA: Diagnosis not present

## 2023-11-19 ENCOUNTER — Encounter (HOSPITAL_COMMUNITY): Payer: Self-pay

## 2023-11-19 ENCOUNTER — Emergency Department (HOSPITAL_COMMUNITY)

## 2023-11-19 ENCOUNTER — Other Ambulatory Visit: Payer: Self-pay

## 2023-11-19 ENCOUNTER — Ambulatory Visit (HOSPITAL_COMMUNITY): Admission: EM | Admit: 2023-11-19 | Discharge: 2023-11-19 | Disposition: A | Discharge status: Home / Self Care

## 2023-11-19 ENCOUNTER — Emergency Department (HOSPITAL_COMMUNITY)
Admission: EM | Admit: 2023-11-19 | Discharge: 2023-11-19 | Disposition: A | Discharge status: Home / Self Care | Attending: Emergency Medicine | Admitting: Emergency Medicine

## 2023-11-19 DIAGNOSIS — M47812 Spondylosis without myelopathy or radiculopathy, cervical region: Secondary | ICD-10-CM | POA: Diagnosis not present

## 2023-11-19 DIAGNOSIS — W182XXA Fall in (into) shower or empty bathtub, initial encounter: Secondary | ICD-10-CM | POA: Diagnosis not present

## 2023-11-19 DIAGNOSIS — Z79899 Other long term (current) drug therapy: Secondary | ICD-10-CM | POA: Insufficient documentation

## 2023-11-19 DIAGNOSIS — Z7902 Long term (current) use of antithrombotics/antiplatelets: Secondary | ICD-10-CM | POA: Insufficient documentation

## 2023-11-19 DIAGNOSIS — R0781 Pleurodynia: Secondary | ICD-10-CM | POA: Diagnosis not present

## 2023-11-19 DIAGNOSIS — Z7901 Long term (current) use of anticoagulants: Secondary | ICD-10-CM

## 2023-11-19 DIAGNOSIS — Z7982 Long term (current) use of aspirin: Secondary | ICD-10-CM | POA: Diagnosis not present

## 2023-11-19 DIAGNOSIS — W19XXXA Unspecified fall, initial encounter: Secondary | ICD-10-CM

## 2023-11-19 DIAGNOSIS — M25561 Pain in right knee: Secondary | ICD-10-CM | POA: Diagnosis not present

## 2023-11-19 DIAGNOSIS — R4182 Altered mental status, unspecified: Secondary | ICD-10-CM | POA: Diagnosis not present

## 2023-11-19 DIAGNOSIS — R7402 Elevation of levels of lactic acid dehydrogenase (LDH): Secondary | ICD-10-CM | POA: Diagnosis not present

## 2023-11-19 DIAGNOSIS — H5509 Other forms of nystagmus: Secondary | ICD-10-CM | POA: Diagnosis not present

## 2023-11-19 DIAGNOSIS — S0990XA Unspecified injury of head, initial encounter: Secondary | ICD-10-CM

## 2023-11-19 DIAGNOSIS — R519 Headache, unspecified: Secondary | ICD-10-CM | POA: Diagnosis not present

## 2023-11-19 DIAGNOSIS — R42 Dizziness and giddiness: Secondary | ICD-10-CM | POA: Diagnosis not present

## 2023-11-19 DIAGNOSIS — Z981 Arthrodesis status: Secondary | ICD-10-CM | POA: Diagnosis not present

## 2023-11-19 DIAGNOSIS — M542 Cervicalgia: Secondary | ICD-10-CM | POA: Diagnosis not present

## 2023-11-19 LAB — CBC WITH DIFFERENTIAL/PLATELET
Abs Immature Granulocytes: 0.04 K/uL (ref 0.00–0.07)
Basophils Absolute: 0 K/uL (ref 0.0–0.1)
Basophils Relative: 0 %
Eosinophils Absolute: 0 K/uL (ref 0.0–0.5)
Eosinophils Relative: 0 %
HCT: 30.8 % — ABNORMAL LOW (ref 36.0–46.0)
Hemoglobin: 9.8 g/dL — ABNORMAL LOW (ref 12.0–15.0)
Immature Granulocytes: 2 %
Lymphocytes Relative: 14 %
Lymphs Abs: 0.3 K/uL — ABNORMAL LOW (ref 0.7–4.0)
MCH: 31.3 pg (ref 26.0–34.0)
MCHC: 31.8 g/dL (ref 30.0–36.0)
MCV: 98.4 fL (ref 80.0–100.0)
Monocytes Absolute: 0.5 K/uL (ref 0.1–1.0)
Monocytes Relative: 21 %
Neutro Abs: 1.4 K/uL — ABNORMAL LOW (ref 1.7–7.7)
Neutrophils Relative %: 63 %
Platelets: 166 K/uL (ref 150–400)
RBC: 3.13 MIL/uL — ABNORMAL LOW (ref 3.87–5.11)
RDW: 21 % — ABNORMAL HIGH (ref 11.5–15.5)
WBC: 2.3 K/uL — ABNORMAL LOW (ref 4.0–10.5)
nRBC: 0.9 % — ABNORMAL HIGH (ref 0.0–0.2)

## 2023-11-19 LAB — URINALYSIS, W/ REFLEX TO CULTURE (INFECTION SUSPECTED)
Bilirubin Urine: NEGATIVE
Glucose, UA: NEGATIVE mg/dL
Ketones, ur: NEGATIVE mg/dL
Leukocytes,Ua: NEGATIVE
Nitrite: NEGATIVE
Protein, ur: NEGATIVE mg/dL
Specific Gravity, Urine: 1.008 (ref 1.005–1.030)
pH: 7 (ref 5.0–8.0)

## 2023-11-19 LAB — I-STAT CHEM 8, ED
BUN: 17 mg/dL (ref 8–23)
Calcium, Ion: 1.21 mmol/L (ref 1.15–1.40)
Chloride: 94 mmol/L — ABNORMAL LOW (ref 98–111)
Creatinine, Ser: 1.3 mg/dL — ABNORMAL HIGH (ref 0.44–1.00)
Glucose, Bld: 221 mg/dL — ABNORMAL HIGH (ref 70–99)
HCT: 33 % — ABNORMAL LOW (ref 36.0–46.0)
Hemoglobin: 11.2 g/dL — ABNORMAL LOW (ref 12.0–15.0)
Potassium: 3 mmol/L — ABNORMAL LOW (ref 3.5–5.1)
Sodium: 136 mmol/L (ref 135–145)
TCO2: 28 mmol/L (ref 22–32)

## 2023-11-19 LAB — TSH: TSH: 1.807 u[IU]/mL (ref 0.350–4.500)

## 2023-11-19 LAB — COMPREHENSIVE METABOLIC PANEL WITH GFR
ALT: 28 U/L (ref 0–44)
AST: 47 U/L — ABNORMAL HIGH (ref 15–41)
Albumin: 3.1 g/dL — ABNORMAL LOW (ref 3.5–5.0)
Alkaline Phosphatase: 100 U/L (ref 38–126)
Anion gap: 14 (ref 5–15)
BUN: 16 mg/dL (ref 8–23)
CO2: 29 mmol/L (ref 22–32)
Calcium: 9.5 mg/dL (ref 8.9–10.3)
Chloride: 94 mmol/L — ABNORMAL LOW (ref 98–111)
Creatinine, Ser: 1.47 mg/dL — ABNORMAL HIGH (ref 0.44–1.00)
GFR, Estimated: 37 mL/min — ABNORMAL LOW (ref >60)
Glucose, Bld: 220 mg/dL — ABNORMAL HIGH (ref 70–99)
Potassium: 3.2 mmol/L — ABNORMAL LOW (ref 3.5–5.1)
Sodium: 137 mmol/L (ref 135–145)
Total Bilirubin: 1.3 mg/dL — ABNORMAL HIGH (ref 0.0–1.2)
Total Protein: 7.1 g/dL (ref 6.5–8.1)

## 2023-11-19 LAB — I-STAT CG4 LACTIC ACID, ED: Lactic Acid, Venous: 2.6 mmol/L (ref 0.5–1.9)

## 2023-11-19 LAB — VITAMIN B12: Vitamin B-12: 3694 pg/mL — ABNORMAL HIGH (ref 180–914)

## 2023-11-19 MED ORDER — ACETAMINOPHEN 500 MG PO TABS
1000.0000 mg | ORAL_TABLET | Freq: Once | ORAL | Status: AC
  Administered 2023-11-19: 1000 mg via ORAL
  Filled 2023-11-19: qty 2

## 2023-11-19 MED ORDER — SODIUM CHLORIDE 0.9 % IV BOLUS
500.0000 mL | Freq: Once | INTRAVENOUS | Status: AC
  Administered 2023-11-19: 500 mL via INTRAVENOUS

## 2023-11-19 MED ORDER — MECLIZINE HCL 25 MG PO TABS
25.0000 mg | ORAL_TABLET | Freq: Once | ORAL | Status: AC
  Administered 2023-11-19: 25 mg via ORAL
  Filled 2023-11-19: qty 1

## 2023-11-19 NOTE — ED Provider Notes (Signed)
 Pt signed out by Dr. Lenor pending MRI.  MRI images reviewed.  I agree with the radiologist.  MRI: No evidence of acute intracranial abnormality.   Pt was able to ambulate with assistance (usually uses a walker).  She complained of knee pain, but that knee is scheduled to be replaced.  I did speak with her husband who will come get her.  She will need neuro f/u as an outpatient.   Dean Clarity, MD 11/19/23 2137

## 2023-11-19 NOTE — ED Notes (Addendum)
 Patient is being discharged from the Urgent Care and sent to the Emergency Department via private vehicle . Rebecca Rising, PA-C, patient is in need of higher level of care due to frequent falls while on blood thinner. Patient is aware and verbalizes understanding of plan of care.  Vitals:   11/19/23 1303  BP: (!) 166/83  Pulse: (!) 107  Resp: 16  Temp: 98.8 F (37.1 C)  SpO2: (!) 67%

## 2023-11-19 NOTE — ED Notes (Signed)
 Pt transported to MRI

## 2023-11-19 NOTE — ED Notes (Signed)
 Husband is on the way to pick up patient

## 2023-11-19 NOTE — ED Provider Notes (Signed)
 [facility]    CSN: [id number] Arrival date & time: 11/19/23  1229      History   Chief Complaint Chief Complaint  Patient presents with   Fall    HPI [name] is a 78 y.o. female.  Here with husband Today she has fallen 3 times.  The most recent time she hit her head.  Has been feeling dizzy and off balance.  She is on Plavix .  Husband reports he found her on the floor in the closet yesterday.  She does not remember falling yesterday or today.   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   Carotid artery stenosis 09/01/2021   Asymptomatic carotid artery stenosis without infarction, right 09/01/2021   Low back pain 12/28/2020   Bilateral primary osteoarthritis of knee 10/29/2019    Past Surgical History:  Procedure Laterality Date   ABDOMINAL HYSTERECTOMY  ~ 1977   ANTERIOR CERVICAL DECOMPRESSION/DISCECTOMY FUSION 4 LEVELS N/A 07/23/2012   Procedure: ANTERIOR CERVICAL DECOMPRESSION/DISCECTOMY FUSION 3 LEVELS;  Surgeon: Oneil Rodgers Priestly, MD;  Location: [facility] OR;  Service: Orthopedics;  Laterality: N/A;  Anterior cervical decompression fusion, cervical 3-4, cervical 4-5, cervical 5-6 with instrumentation and allograft.   ENDARTERECTOMY Right 09/01/2021   Procedure: RIGHT CAROTID ENDARTERECTOMY;  Surgeon: Serene Gaile ORN, MD;  Location: [facility];  Service: Vascular;  Laterality: Right;   FEMORAL ARTERY STENT Bilateral 2011   LUMBAR LAMINECTOMY/DECOMPRESSION MICRODISCECTOMY N/A 03/22/2021   Procedure:  LUMBAR 4 - LUMBAR 5 DECOMPRESSION;  Surgeon: Priestly Oneil, MD;  Location: [facility] OR;  Service: Orthopedics;  Laterality: N/A;   PATCH ANGIOPLASTY Right 09/01/2021   Procedure: PATCH ANGIOPLASTY OF RIGHT CAROTID ARTERY USING XENOSURE BOVINE PERICARDIUM PATCH;  Surgeon: Serene Gaile ORN, MD;  Location: [facility] OR;  Service: Vascular;  Laterality: Right;   POSTERIOR CERVICAL FUSION/FORAMINOTOMY N/A 07/24/2012   Procedure: POSTERIOR CERVICAL FUSION/FORAMINOTOMY LEVEL 5;  Surgeon: Oneil Rodgers Priestly, MD;  Location: [facility] OR;  Service: Orthopedics;  Laterality: N/A;  Posterior cervical decompression, cervical 6-7, C7-T1, T1-T2. Cervical fusion cervical 3-4, cervical 4-5, cervical 5-6, cervical 6-7, cervical 7-T1 with allograft, local autograft.   POSTERIOR FUSION CERVICAL SPINE  07/24/2012   C3-T2 posterior fusion with instrumentation and allograft, C6-T2 decompression)    TONSILLECTOMY  1960's?   TOTAL HIP ARTHROPLASTY Left 06/22/2022   Procedure: LEFT TOTAL HIP ARTHROPLASTY ANTERIOR APPROACH;  Surgeon: Vernetta Lonni GRADE, MD;  Location: [facility];  Service: Orthopedics;  Laterality: Left;    OB History   No obstetric history on file.      Home Medications    Prior to Admission medications   Medication Sig Start Date End Date Taking? Authorizing Provider  amitriptyline  (ELAVIL ) 150 MG tablet Take 150 mg by mouth at bedtime.    [provider]  amLODipine  (NORVASC ) 10 MG tablet Take 10 mg by mouth daily. Patient not taking: Reported on 11/19/2023    [provider]  Aspirin  81 MG CAPS  ASPIRIN  81 MG Patient not taking: Reported on 11/19/2023    [provider]  atorvastatin  (LIPITOR) 20 MG tablet Take 20 mg by mouth daily. 05/26/21   [provider]  cholecalciferol  (VITAMIN D3) 25 MCG (1000 UNIT) tablet Take 1,000 Units by mouth daily.    [provider]  clopidogrel  (PLAVIX ) 75 MG tablet Take 75 mg by mouth daily.    [provider]  estrogens ,  conjugated, (PREMARIN ) 0.625 MG tablet Take 0.625 mg by mouth daily. Take daily for 21 days then do not take for 7 days.    [provider]  Fluticasone -Salmeterol (ADVAIR ) 100-50 MCG/DOSE AEPB Inhale 1 puff into the lungs 2 (two) times daily as needed (shortness of breath).    [provider]  folic acid  (FOLVITE ) 1 MG tablet Take 1 mg by mouth daily. 07/05/21   [provider]  levothyroxine  (SYNTHROID ) 100 MCG tablet Take 100 mcg by mouth daily before breakfast.    [provider]  losartan  (COZAAR ) 50 MG tablet Take 50 mg by mouth daily.    [provider]  meloxicam  (MOBIC ) 15 MG tablet Take 15 mg by mouth daily. 08/03/21   [provider]  methocarbamol  (ROBAXIN ) 500 MG tablet Take 1 tablet (500 mg total) by mouth every 6 (six) hours as needed for muscle spasms. 06/23/22   Vernetta Lonni GRADE, MD  methotrexate (RHEUMATREX) 2.5 MG tablet Take 5 mg by mouth 4 (four) times a week. Mon - Thurs 07/05/21   [provider]  Multiple Vitamin (MULTIVITAMIN WITH MINERALS) TABS tablet Take 1 tablet by mouth daily.    [provider]  nystatin  (MYCOSTATIN ) 100000 UNIT/ML suspension Take 5 mLs by mouth 3 (three) times daily. 06/04/22   [provider]  pantoprazole  (PROTONIX ) 40 MG tablet Take 40 mg by mouth daily.    [provider]  pregabalin  (LYRICA ) 50 MG capsule Take 50 mg by mouth 2 (two) times daily. Patient not taking: Reported on 11/19/2023 01/26/21   [provider]  REPATHA 140 MG/ML SOSY Inject 140 mg into the skin every 14 (fourteen) days. 07/24/21   [provider]    Family History Family History  Problem Relation Age of Onset   Heart failure Mother    Hypertension Father    Hypertension Sister    Hypertension Brother    Hypertension Brother    Heart disease Brother     Social History Social History   Tobacco Use   Smoking status: Former    Current packs/day: 0.00     Average packs/day: 0.3 packs/day for 30.0 years (7.5 ttl pk-yrs)    Types: Cigarettes    Start date: 12/16/1990    Quit date: 12/15/2020    Years since quitting: 2.9   Smokeless tobacco: Never  Vaping Use   Vaping status: Never Used  Substance Use Topics   Alcohol use: Not Currently   Drug use: No     Allergies   Penicillin g, Pine, and Codeine   Review of Systems Review of Systems As per HPI  Physical Exam Triage Vital Signs ED Triage Vitals  Encounter Vitals Group     BP 11/19/23 1303 (!) 166/83     Girls Systolic BP Percentile --      Girls Diastolic BP Percentile --      Boys Systolic BP Percentile --      Boys Diastolic BP Percentile --      Pulse Rate 11/19/23 1303 (!) 107  Resp 11/19/23 1303 16     Temp 11/19/23 1303 98.8 F (37.1 C)     Temp Source 11/19/23 1303 Oral     SpO2 11/19/23 1303 (!) 67 %     Weight --      Height --      Head Circumference --      Peak Flow --      Pain Score 11/19/23 1310 9     Pain Loc --      Pain Education --      Exclude from Growth Chart --    No data found.  Updated Vital Signs BP (!) 166/83 (BP Location: Right Arm)   Pulse (!) 107   Temp 98.8 F (37.1 C) (Oral)   Resp 16   SpO2 (!) 67% Comment: Provider informed immediately. Attemted to read with multiple O2 sensors. Checked with finger and earlobe.  Cold fingers. Attempted to warm. O2 sensor not reading.   Physical Exam Vitals and nursing note reviewed.  HENT:     Head: Atraumatic.     Mouth/Throat:     Pharynx: Oropharynx is clear.  Cardiovascular:     Rate and Rhythm: Regular rhythm. Tachycardia present.     Pulses: Normal pulses.     Heart sounds: Normal heart sounds.  Pulmonary:     Effort: Pulmonary effort is normal.     Breath sounds: Normal breath sounds.  Skin:    Capillary Refill: Capillary refill takes less than 2 seconds.     Comments: Fingers are cool to touch.  Cap refill 2 seconds.  Strong radial pulses  Neurological:     Mental  Status: She is alert. Mental status is at baseline.     Comments: Per husband    UC Treatments / Results  Labs (all labs ordered are listed, but only abnormal results are displayed) Labs Reviewed - No data to display  EKG  Radiology No results found.  Procedures Procedures  Medications Ordered in UC Medications - No data to display  Initial Impression / Assessment and Plan / UC Course  I have reviewed the triage vital signs and the nursing notes.  Pertinent labs & imaging results that were available during my care of the patient were reviewed by me and considered in my medical decision making (see chart for details).  Multiple falls, most recent today with head injury.  On blood thinner.  She is having dizziness and feeling off balance. Discussed with husband he needs to transport her to the emergency department across the parking lot.  Requires higher level of care, advanced imaging not available in the urgent care setting.  CMA did call charge RN to notify of their expected arrival.  Final Clinical Impressions(s) / UC Diagnoses   Final diagnoses:  Injury of head, initial encounter  Fall, initial encounter  Anticoagulated  Dizziness   Discharge Instructions   None    ED Prescriptions   None    PDMP not reviewed this encounter.   Jeryl Stabs, PA-C 11/19/23 1335

## 2023-11-19 NOTE — ED Triage Notes (Signed)
 Pt states that she got out the bed and went to take a shower. She states that she fell 2x today, once in the shower and the other time was in the bedroom. Denies any LOC. She did report hitting her head but denies any pain or injuries. Denies any dizziness. Headache 8/10. Frequent falls 6 months to 1 year. Taking clopidogrel .

## 2023-11-19 NOTE — ED Provider Notes (Signed)
 [place] [facility]PARTMENT AT [facility] Provider Note   CSN: [id number] Arrival date & time: 11/19/23  1347     Patient presents with: [name] is a 78 y.o. female.   Patient is a 78 year old female who presents after falling.  [name] is here with her husband who also helps provide story.  [name] has had about 5 falls over the last 2 days.  [name] states that [name] has been dizzy when [name] bends over to pick up something and [name] falls.  Most recently [name] fell in the shower and hit her head.  [name] is not on Plavix .  [name] denies any complaints of pain from the fall other than a minor headache.  Her husband states that [name] has been having decline over about the last 6 weeks.  [name] has been falling frequently.  [name] has had some increased confusion.  [name] has had some forgetfulness and he has noticed changes such as her handwriting is different.  [name] forgets how to do things such as sit down into a chair.  He states that yesterday he was out working on some yard work and when he came back and he found her laying naked in the closet.  When asked, [name] said [name] was cleaning out her closet.  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  He states that [name] falls all the time.       Prior to Admission medications   Medication Sig Start Date End Date Taking? Authorizing Provider  amitriptyline  (ELAVIL ) 150 MG tablet Take 150 mg by mouth at bedtime.    [provider]  amLODipine  (NORVASC ) 10 MG tablet Take 10 mg by mouth daily. Patient not taking: Reported on 11/19/2023    [provider]  Aspirin  81 MG CAPS ASPIRIN  81 MG Patient not taking: Reported on 11/19/2023    [provider]  atorvastatin  (LIPITOR) 20 MG tablet Take 20 mg by mouth daily. 05/26/21   [provider]  cholecalciferol  (VITAMIN D3) 25 MCG (1000 UNIT) tablet Take 1,000 Units by mouth daily.    [provider]   clopidogrel  (PLAVIX ) 75 MG tablet Take 75 mg by mouth daily.    [provider]  estrogens , conjugated, (PREMARIN ) 0.625 MG tablet Take 0.625 mg by mouth daily. Take daily for 21 days then do not take for 7 days.    [provider]  Fluticasone -Salmeterol (ADVAIR ) 100-50 MCG/DOSE AEPB Inhale 1 puff into the lungs 2 (two) times daily as needed (shortness of breath).    [provider]  folic acid  (FOLVITE ) 1 MG tablet Take 1 mg by mouth daily. 07/05/21   [provider]  levothyroxine  (SYNTHROID ) 100 MCG tablet Take 100 mcg by mouth daily before breakfast.    [provider]  losartan  (COZAAR ) 50 MG tablet Take 50 mg by mouth daily.    [provider]  meloxicam  (MOBIC ) 15 MG tablet Take 15 mg by mouth daily. 08/03/21   [provider]  methocarbamol  (ROBAXIN ) 500 MG tablet Take 1 tablet (500 mg total) by mouth every 6 (six) hours as needed for muscle spasms. 06/23/22   Vernetta Lonni GRADE, MD  methotrexate (RHEUMATREX) 2.5 MG tablet Take 5 mg by mouth 4 (four) times a week. Mon - Thurs 07/05/21   [provider]  Multiple Vitamin (MULTIVITAMIN WITH MINERALS) TABS tablet Take 1 tablet by mouth daily.  [provider]  nystatin  (MYCOSTATIN ) 100000 UNIT/ML suspension Take 5 mLs by mouth 3 (three) times daily. 06/04/22   [provider]  pantoprazole  (PROTONIX ) 40 MG tablet Take 40 mg by mouth daily.    [provider]  pregabalin  (LYRICA ) 50 MG capsule Take 50 mg by mouth 2 (two) times daily. Patient not taking: Reported on 11/19/2023 01/26/21   [provider]  REPATHA 140 MG/ML SOSY Inject 140 mg into the skin every 14 (fourteen) days. 07/24/21   [provider]    Allergies: Penicillin g, Pine, and Codeine    Review of Systems  Constitutional:  Positive for fatigue. Negative for chills, diaphoresis and fever.  HENT:  Negative for congestion, rhinorrhea and sneezing.   Eyes:  Negative.   Respiratory:  Negative for cough, chest tightness and shortness of breath.   Cardiovascular:  Negative for chest pain and leg swelling.  Gastrointestinal:  Negative for abdominal pain, blood in stool, diarrhea, nausea and vomiting.  Genitourinary:  Negative for difficulty urinating, flank pain, frequency and hematuria.  Musculoskeletal:  Positive for arthralgias (Chronic). Negative for back pain.  Skin:  Negative for rash.  Neurological:  Positive for dizziness. Negative for speech difficulty, weakness, numbness and headaches.    Updated Vital Signs BP (!) 142/98   Pulse 94   Temp 99 F (37.2 C) (Rectal)   Resp 15   Ht 5' 7 (1.702 m)   Wt 62.6 kg   SpO2 96%   BMI 21.61 kg/m   Physical Exam Constitutional:      Appearance: [name] is well-developed.  HENT:     Head: Normocephalic and atraumatic.  Eyes:     Pupils: Pupils are equal, round, and reactive to light.     Comments: Horizontal nystagmus with a fast component to the left, no vertical or rotational nystagmus  Cardiovascular:     Rate and Rhythm: Normal rate and regular rhythm.     Heart sounds: Normal heart sounds.  Pulmonary:     Effort: Pulmonary effort is normal. No respiratory distress.     Breath sounds: Normal breath sounds. No wheezing or rales.  Chest:     Chest wall: No tenderness.  Abdominal:     General: Bowel sounds are normal.     Palpations: Abdomen is soft.     Tenderness: There is no abdominal tenderness. There is no guarding or rebound.  Musculoskeletal:        General: Normal range of motion.     Cervical back: Normal range of motion and neck supple.  Lymphadenopathy:     Cervical: No cervical adenopathy.  Skin:    General: Skin is warm and dry.     Findings: No rash.  Neurological:     General: No focal deficit present.     Mental Status: [name] is alert and oriented to person, place, and time.     Comments: Motor 5/5 all extremities Sensation grossly intact to LT all  extremities Finger to Nose intact, no pronator drift CN II-XII grossly intact       (all labs ordered are listed, but only abnormal results are displayed) Labs Reviewed  COMPREHENSIVE METABOLIC PANEL WITH GFR - Abnormal; Notable for the following components:      Result Value   Potassium 3.2 (*)    Chloride 94 (*)    Glucose, Bld 220 (*)    Creatinine, Ser 1.47 (*)    Albumin 3.1 (*)    AST 47 (*)  Total Bilirubin 1.3 (*)    GFR, Estimated 37 (*)    All other components within normal limits  CBC WITH DIFFERENTIAL/PLATELET - Abnormal; Notable for the following components:   WBC 2.3 (*)    RBC 3.13 (*)    Hemoglobin 9.8 (*)    HCT 30.8 (*)    RDW 21.0 (*)    nRBC 0.9 (*)    Neutro Abs 1.4 (*)    Lymphs Abs 0.3 (*)    All other components within normal limits  I-STAT CHEM 8, ED - Abnormal; Notable for the following components:   Potassium 3.0 (*)    Chloride 94 (*)    Creatinine, Ser 1.30 (*)    Glucose, Bld 221 (*)    Hemoglobin 11.2 (*)    HCT 33.0 (*)    All other components within normal limits  I-STAT CG4 LACTIC ACID, ED - Abnormal; Notable for the following components:   Lactic Acid, Venous 2.6 (*)    All other components within normal limits  URINALYSIS, W/ REFLEX TO CULTURE (INFECTION SUSPECTED)  TSH  VITAMIN B12    EKG: None  Radiology: CT Cervical Spine Wo Contrast Result Date: 11/19/2023 EXAM: CT CERVICAL SPINE WITHOUT CONTRAST 11/19/2023 02:31:52 PM TECHNIQUE: CT of the cervical spine was performed without the administration of intravenous contrast. Multiplanar reformatted images are provided for review. Automated exposure control, iterative reconstruction, and/or weight based adjustment of the mA/kV was utilized to reduce the radiation dose to as low as reasonably achievable. COMPARISON: Cervical spine series dated 07/25/1999. CLINICAL HISTORY: Neck trauma (Age >= 65y). Patient here today with c/o frequent falls for more than 6 weeks. Patient fell 3  times today. Patient has been dizzy and states that [name] has pain all over but has h/o arthritis and fibromyalgia. FINDINGS: CERVICAL SPINE: BONES AND ALIGNMENT: The patient is again noted to be status post interbody fusion and anterior spinal fixation at C3-4, C4-5 and C5-6. The patient is also status post bilateral posterolateral spinal fusion of C3 through T1. The orthopedic hardware is intact. There is a dextrocurvature of the Cervicothoracic spine. There is no evidence of acute traumatic injury. DEGENERATIVE CHANGES: No significant degenerative changes. SOFT TISSUES: No prevertebral soft tissue swelling. IMPRESSION: 1. No evidence of acute traumatic injury. 2. Status post interbody fusion and anterior spinal fixation at C3-4, C4-5, and C5-6, and bilateral posterolateral spinal fusion of C3 through T1. The orthopedic hardware is intact. 3. Dextrocurvature of the Cervicothoracic spine. Electronically signed by: evalene coho 11/19/2023 02:50 PM EDT RP Workstation: [id number]   DG Ribs Unilateral W/Chest Left Result Date: 11/19/2023 EXAM: XR Ribs and AP Chest 11/19/2023 02:40:00 PM COMPARISON: None available. CLINICAL HISTORY: Fall, rib pain. Patient states that [name] got out of bed and went to take a shower. [name] states that [name] fell 2 times today, once in the shower and the other time was in the bedroom. Denies any loss of consciousness. [name] did report hitting her head but denies any pain or injuries. Denies any dizziness. Headache 8/10. Frequent falls 6 months to 1 year. Taking clopidogrel . FINDINGS: BONES: No acute displaced rib fracture. LUNGS AND PLEURA: No consolidation or pulmonary edema. No pleural effusion or pneumothorax. HEART AND MEDIASTINUM: No acute abnormality of the cardiac and mediastinal silhouettes. IMPRESSION: 1. No acute rib fracture. 2. No acute process in the lungs. Electronically signed by: evalene coho 11/19/2023 02:47 PM EDT RP Workstation: [id number]   CT Head Wo Contrast Result  Date: 11/19/2023 EXAM: CT HEAD WITHOUT CONTRAST  11/19/2023 02:31:52 PM TECHNIQUE: CT of the head was performed without the administration of intravenous contrast. Automated exposure control, iterative reconstruction, and/or weight based adjustment of the mA/kV was utilized to reduce the radiation dose to as low as reasonably achievable. COMPARISON: None available. CLINICAL HISTORY: Head trauma, minor (Age >= 65y). Patient here today with c/o frequent falls for more than 6 weeks. Patient fell 3 times today. Patient has been dizzy and states that [name] has pain all over but has h/o arthritis and fibromyalgia. Patient's husband found her yesterday on the floor in the closet and he states that he had been sleeping for about 2 hours. Patient states that [name] does not remember falling yesterday but remembers waking up. Husband states that at times [name] starts shaking. FINDINGS: BRAIN AND VENTRICLES: No acute hemorrhage. Gray-white differentiation is preserved. No hydrocephalus. No extra-axial collection. No mass effect or midline shift. There is age commensurate cerebral volume loss and mild periventricular white matter disease. ORBITS: No acute abnormality. SINUSES: No acute abnormality. SOFT TISSUES AND SKULL: No acute soft tissue abnormality. No skull fracture. IMPRESSION: 1. No acute intracranial abnormality. 2. Age commensurate cerebral volume loss and mild periventricular white matter disease. Electronically signed by: evalene coho 11/19/2023 02:43 PM EDT RP Workstation: [id number]     Procedures   Medications Ordered in the ED  meclizine  (ANTIVERT ) tablet 25 mg (has no administration in time range)  sodium chloride  0.9 % bolus 500 mL (0 mLs Intravenous Stopped 11/19/23 1701)                                    Medical Decision Making Amount and/or Complexity of Data Reviewed Labs: ordered. Radiology: ordered.   Patient presents with dizziness and falls.  [name] does have some nystagmus on exam.  No  focal neurologic deficits.  [name] also has had some mental decline over the last 6 weeks per her husband.  Head CT does not show any acute abnormality.  Labs show mild elevation in her lactic acid but [name] is not febrile and does not have any other concerns for infection currently.  Awaiting urinalysis.  Discussed with Dr. Voncile with neurology.  He recommended adding TSH and vitamin B12.  Will check MRI.  If MRI shows acute infarct, patient will need admission.  If not, will need reassessment.  If able to ambulate, may be able to follow-up with outpatient neurology.  Care turned over to Dr. Havlin pending reassessment after labs and MRI.     Final diagnoses:  None    ED Discharge Orders     None          Lenor Hollering, MD 11/19/23 [phone]

## 2023-11-19 NOTE — Progress Notes (Signed)
 Orthopedic Tech Progress Note Patient Details:  [name] 1945-06-05 [id number]  Level 2 trauma   Patient ID: [name], female   DOB: Aug 19, 1945, 78 y.o.   MRN: [id number]  Delanna LITTIE Pac 11/19/2023, 3:20 PM

## 2023-11-19 NOTE — ED Triage Notes (Signed)
 Patient here today with c/o frequent falls for more than 6 weeks. Patient fell 3 times today. Patient has been dizzy and states that she has pain all over but has h/o arthritis and fibromyalgia. Patient's husband found her yesterday on the floor in the closet and he states that he had been sleeping for about 2 hours. Patient states that she does not remember falling yesterday but remembers waking up. Husband states that at times she starts shaking.

## 2023-11-19 NOTE — ED Notes (Signed)
 Pt was able to ambulate with 2 person assist, c/o of some dizziness, pt normally uses walker but unable to locate one. Pt also c/o of R knee pain from fall

## 2023-11-19 NOTE — ED Notes (Signed)
 Charge RN at the ED notified patient heading over there.

## 2023-11-26 ENCOUNTER — Other Ambulatory Visit: Payer: Self-pay | Admitting: Physician Assistant

## 2023-11-26 DIAGNOSIS — Z01818 Encounter for other preprocedural examination: Secondary | ICD-10-CM

## 2023-11-27 ENCOUNTER — Telehealth: Payer: Self-pay | Admitting: Neurology

## 2023-11-27 ENCOUNTER — Ambulatory Visit: Admitting: Orthopaedic Surgery

## 2023-11-27 NOTE — Telephone Encounter (Signed)
 Patient's husband called for verify and directions to appointment

## 2023-11-28 DIAGNOSIS — K219 Gastro-esophageal reflux disease without esophagitis: Secondary | ICD-10-CM | POA: Insufficient documentation

## 2023-11-28 DIAGNOSIS — K2270 Barrett's esophagus without dysplasia: Secondary | ICD-10-CM | POA: Insufficient documentation

## 2023-11-28 NOTE — Progress Notes (Deleted)
  Cardiology Office Note:  .   Date:  11/28/2023  ID:  [name], DOB 01-12-1946, MRN [id number] PCP: Gerome Brunet, DO  [facility] Providers Cardiologist:  None {  History of Present Illness: .   [name] is a 78 y.o. 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     PAD Stable, ABI 04/2019 with right and left lower ABI at the posttibial artery level 0.81.  Multiphasic waveforms.  Status post carotid artery enterectomy Followed by vascular surgery. Gets annual Dopplers Carotid Dopplers 03/2023 with mild stenosis on the right side, moderate stenosis left side Other Echocardiogram 04/2022 with preserved biventricular function  Social history  Smoking 1 to 2 cigarettes each day      Patient with history of stable PAD.  She has pain in her legs with walking and in her left hip and knees.  However she is followed by orthopedics and rheumatology for arthritis.  Pain felt to be more related to arthritic pain rather than true claudication symptoms.  She was seen last 09/2023 and walking 3-5 times per week for approximately 1 hour.  Knee pain had improved with movement.  She had noted some lower extremity edema in encouraged to elevate and use compression stockings.  ABIs and lower extremity arterial Dopplers were ordered, I do not see these were ever completed.  Patient recently seen 11/2023 with dizziness and falls.  Noted to have nystagmus on exam.  MRI was obtained and benign.  Outpatient neurology referral made.  Lower extremity edema  PAD Has history of leg pain although this has been felt to be more related to arthritic pain.  Status post carotid artery enterectomy Followed by vascular surgery.  Most recent Dopplers 03/2023 with mild stenosis on the right and moderate stenosis on the left.  Defer to them about getting annual Dopplers.    ROS: Denies:  Chest pain, shortness of breath, orthopnea, peripheral edema, palpitations, decreased exercise intolerance, fatigue, lightheadedness.   Studies Reviewed: .         Risk Assessment/Calculations:   {Does this patient have ATRIAL FIBRILLATION?[phone]} No BP recorded.  {Refresh Note OR Click here to enter BP  :1}***       Physical Exam:   VS:  There were no vitals taken for this visit.   Wt Readings from Last 3 Encounters:  11/19/23 138 lb (62.6 kg)  09/26/23 143 lb 12.8 oz (65.2 kg)  04/01/23 143 lb 14.4 oz (65.3 kg)    GEN: Well nourished, well developed in no acute distress NECK: No JVD; No carotid bruits CARDIAC: ***RRR, no murmurs, rubs, gallops RESPIRATORY:  Clear to auscultation without rales, wheezing or rhonchi  ABDOMEN: Soft, non-tender, non-distended EXTREMITIES:  No edema; No deformity   ASSESSMENT AND PLAN: .         {Are you ordering a CV Procedure (e.g. stress test, cath, DCCV, TEE, etc)?   Press F2        :[id number]}  Dispo: ***  Signed, Thom LITTIE Sluder, PA-C

## 2023-11-29 ENCOUNTER — Ambulatory Visit: Attending: General Practice | Admitting: General Practice

## 2023-12-02 ENCOUNTER — Ambulatory Visit: Admitting: Neurology

## 2023-12-02 ENCOUNTER — Encounter: Payer: Self-pay | Admitting: Neurology

## 2023-12-02 VITALS — BP 130/80 | HR 111 | Ht 67.0 in | Wt 133.0 lb

## 2023-12-02 DIAGNOSIS — R269 Unspecified abnormalities of gait and mobility: Secondary | ICD-10-CM | POA: Insufficient documentation

## 2023-12-02 DIAGNOSIS — R4189 Other symptoms and signs involving cognitive functions and awareness: Secondary | ICD-10-CM | POA: Diagnosis not present

## 2023-12-02 NOTE — Progress Notes (Signed)
 Chief Complaint  Patient presents with   New Patient (Initial Visit)    Rm 13, with husband, frequent fall, dizziness, memory concerns      ASSESSMENT AND PLAN  [name] is a 78 y.o. female   Complicated medical history including chronic gait abnormality, rheumatoid arthritis, history of cervical lumbar decompression surgery, left hip replacement,  Presenting with subacute worsening of gait abnormality frequent fall, worsening low back pain  Brisk patellar reflex, mild right ankle dorsiflexion weakness  Worry about cervical spondylitic myelopathy, lumbar radiculopathy  MRI of cervical lumbar spine  Home physical therapy Worsening memory loss,  Mini-Mental status 19/30  Strong family history of dementia  MRI of the brain showed no significant abnormality  Both husband and wife wants to do Alzheimer's specific laboratory evaluation for better understanding of her situation,  DIAGNOSTIC DATA (LABS, IMAGING, TESTING) - I reviewed patient records, labs, notes, testing and imaging myself where available.   MEDICAL HISTORY:  [name], is a 78 year old female, accompanied by her husband seen in request by her primary care from [facility], [place], for evaluation of frequent falling, memory loss,   History is obtained from the patient and review of electronic medical records. I personally reviewed pertinent available imaging films in PACS.   PMHx of  HTN Hypothyrodism HLD RA-methotrexate Hx of cervical decompression in 2014, chronic neck pain, shoulder pain. Carotid artery disease, right endarterectomy  Peripheral vascular disease. Used to smoke quit in 2024, Lumbar decompression,   She has complicated medical history, including cervical and lumbar decompression surgery in the past, had gradual worsening gait abnormality over the past few years, due to severe left hip pain, multiple joints pain, had left hip replacement in March 2024,  recovered well, she was able to walk without assistant for a while  Then she had acute worsening frequent for since July 2025, right leg seems to give her more trouble, right knee turned inward towards the left side, also complains of worsening low back pain, occasional with bowel and bladder episode, deny language difficulty, denies right arm involvement  She also concerned slow worsening memory loss over the past few years acute worsening over past 2 months, older brother suffer dementia, her Mini-Mental status examination today 19/30  Already had MRI of the brain without contrast November 19, 2023, mild small vessel disease no acute abnormality  She was treated at emergency room on November 19, 2023, after fell, already suffered few fall even prior to that,  CT cervical spine multilevel degenerative changes, evidence of anterior cervical interbody fusion C3 4 C45, C5 6, bilateral posterior lateral spinal fusion of C3-T1, hardware is intact,  CT head showed no acute abnormality  PHYSICAL EXAM:   Vitals:   12/02/23 1505 12/02/23 1511  BP: 118/76 130/80  Pulse:  (!) 111  SpO2:  99%  Weight: 133 lb (60.3 kg)   Height: 5' 7 (1.702 m)    Not recorded     Body mass index is 20.83 kg/m.  PHYSICAL EXAMNIATION:  Gen: NAD, conversant, well nourised, well groomed                     Cardiovascular: Regular rate rhythm, no peripheral edema, warm, nontender. Eyes: Conjunctivae clear without exudates or hemorrhage Neck: Supple, no carotid bruits. Pulmonary: Clear to auscultation bilaterally   NEUROLOGICAL EXAM:  MENTAL STATUS: Speech/cognition: Depressed looking elderly female, sitting in wheelchair, rely on her husband to provide most history  12/02/2023    3:16 PM  MMSE - Mini Mental State Exam  Orientation to time 4  Orientation to Place 4  Registration 3  Attention/ Calculation 0  Recall 1  Language- name 2 objects 2  Language- repeat 1  Language- follow 3 step command 2   Language- read & follow direction 1  Write a sentence 1  Copy design 0  Total score 19    CRANIAL NERVES: CN II: Visual fields are full to confrontation. Pupils are round equal and briskly reactive to light. CN III, IV, VI: extraocular movement are normal. No ptosis. CN V: Facial sensation is intact to light touch CN VII: Face is symmetric with normal eye closure  CN VIII: Hearing is normal to causal conversation. CN IX, X: Phonation is normal. CN XI: Head turning and shoulder shrug are intact  MOTOR: Sitting in wheelchair, moving upper extremity without difficulty, mild bilateral hip flexion weakness, but limited due to her posturing and low back pain, seems to have mild right ankle dorsiflexion weakness  REFLEXES: Reflexes are 2 and symmetric at the biceps, triceps, knees, and absent at ankles. Plantar responses are flexor.  SENSORY: Mildly length-dependent decreased vibratory, light touch pinprick to below knee level   COORDINATION: There is no trunk or limb dysmetria noted.  GAIT/STANCE: Need help get up from seated position, stiff cautious unsteady  REVIEW OF SYSTEMS:  Full 14 system review of systems performed and notable only for as above All other review of systems were negative.   ALLERGIES: Allergies  Allergen Reactions   Penicillin G     Childhood Reaction    Pine     Seasonal allergy   Codeine Itching    HOME MEDICATIONS: Current Outpatient Medications  Medication Sig Dispense Refill   amitriptyline  (ELAVIL ) 150 MG tablet Take 150 mg by mouth at bedtime.     amLODipine  (NORVASC ) 10 MG tablet Take 10 mg by mouth daily.     Aspirin  81 MG CAPS ASPIRIN  81 MG     atorvastatin  (LIPITOR) 20 MG tablet Take 20 mg by mouth daily.     cholecalciferol  (VITAMIN D3) 25 MCG (1000 UNIT) tablet Take 1,000 Units by mouth daily.     clopidogrel  (PLAVIX ) 75 MG tablet Take 75 mg by mouth daily.     estrogens , conjugated, (PREMARIN ) 0.625 MG tablet Take 0.625 mg by mouth  daily. Take daily for 21 days then do not take for 7 days.     Fluticasone -Salmeterol (ADVAIR ) 100-50 MCG/DOSE AEPB Inhale 1 puff into the lungs 2 (two) times daily as needed (shortness of breath).     folic acid  (FOLVITE ) 1 MG tablet Take 1 mg by mouth daily.     levothyroxine  (SYNTHROID ) 100 MCG tablet Take 100 mcg by mouth daily before breakfast.     losartan  (COZAAR ) 50 MG tablet Take 50 mg by mouth daily.     meloxicam  (MOBIC ) 15 MG tablet Take 15 mg by mouth daily.     methocarbamol  (ROBAXIN ) 500 MG tablet Take 1 tablet (500 mg total) by mouth every 6 (six) hours as needed for muscle spasms. 40 tablet 1   methotrexate (RHEUMATREX) 2.5 MG tablet Take 5 mg by mouth 4 (four) times a week. Mon - Thurs     Multiple Vitamin (MULTIVITAMIN WITH MINERALS) TABS tablet Take 1 tablet by mouth daily.     nystatin  (MYCOSTATIN ) 100000 UNIT/ML suspension Take 5 mLs by mouth 3 (three) times daily.     pantoprazole  (PROTONIX ) 40 MG tablet  Take 40 mg by mouth daily.     REPATHA 140 MG/ML SOSY Inject 140 mg into the skin every 14 (fourteen) days.     pregabalin  (LYRICA ) 50 MG capsule Take 50 mg by mouth 2 (two) times daily. (Patient not taking: Reported on 12/02/2023)     No current facility-administered medications for this visit.    PAST MEDICAL HISTORY: Past Medical History:  Diagnosis Date   Arthritis    all over (07/25/2012)   Asthma    Carotid artery disease (HCC)    Environmental allergies    Fibromyalgia    GERD (gastroesophageal reflux disease)    Hyperlipidemia    Hypertension    Hypothyroidism    Peripheral vascular disease (HCC)    Post-menopausal    on HRT x 25 years    PAST SURGICAL HISTORY: Past Surgical History:  Procedure Laterality Date   ABDOMINAL HYSTERECTOMY  ~ 1977   ANTERIOR CERVICAL DECOMPRESSION/DISCECTOMY FUSION 4 LEVELS N/A 07/23/2012   Procedure: ANTERIOR CERVICAL DECOMPRESSION/DISCECTOMY FUSION 3 LEVELS;  Surgeon: Oneil Rodgers Priestly, MD;  Location: [facility] OR;   Service: Orthopedics;  Laterality: N/A;  Anterior cervical decompression fusion, cervical 3-4, cervical 4-5, cervical 5-6 with instrumentation and allograft.   ENDARTERECTOMY Right 09/01/2021   Procedure: RIGHT CAROTID ENDARTERECTOMY;  Surgeon: Serene Gaile ORN, MD;  Location: [facility] OR;  Service: Vascular;  Laterality: Right;   FEMORAL ARTERY STENT Bilateral 2011   LUMBAR LAMINECTOMY/DECOMPRESSION MICRODISCECTOMY N/A 03/22/2021   Procedure: LUMBAR 4 - LUMBAR 5 DECOMPRESSION;  Surgeon: Priestly Oneil, MD;  Location: [facility] OR;  Service: Orthopedics;  Laterality: N/A;   PATCH ANGIOPLASTY Right 09/01/2021   Procedure: PATCH ANGIOPLASTY OF RIGHT CAROTID ARTERY USING XENOSURE BOVINE PERICARDIUM PATCH;  Surgeon: Serene Gaile ORN, MD;  Location: [facility] OR;  Service: Vascular;  Laterality: Right;   POSTERIOR CERVICAL FUSION/FORAMINOTOMY N/A 07/24/2012   Procedure: POSTERIOR CERVICAL FUSION/FORAMINOTOMY LEVEL 5;  Surgeon: Oneil Rodgers Priestly, MD;  Location: [facility] OR;  Service: Orthopedics;  Laterality: N/A;  Posterior cervical decompression, cervical 6-7, C7-T1, T1-T2. Cervical fusion cervical 3-4, cervical 4-5, cervical 5-6, cervical 6-7, cervical 7-T1 with allograft, local autograft.   POSTERIOR FUSION CERVICAL SPINE  07/24/2012   C3-T2 posterior fusion with instrumentation and allograft, C6-T2 decompression)    TONSILLECTOMY  1960's?   TOTAL HIP ARTHROPLASTY Left 06/22/2022   Procedure: LEFT TOTAL HIP ARTHROPLASTY ANTERIOR APPROACH;  Surgeon: Vernetta Lonni GRADE, MD;  Location: [facility] ORS;  Service: Orthopedics;  Laterality: Left;    FAMILY HISTORY: Family History  Problem Relation Age of Onset   Heart failure Mother    Hypertension Father    Hypertension Sister    Hypertension Brother    Hypertension Brother    Heart disease Brother     SOCIAL HISTORY: Social History   Socioeconomic History   Marital status: Married    Spouse name: Not on file   Number of children: 1   Years of education: Not on file    Highest education level: Not on file  Occupational History   Not on file  Tobacco Use   Smoking status: Former    Current packs/day: 0.00    Average packs/day: 0.3 packs/day for 30.0 years (7.5 ttl pk-yrs)    Types: Cigarettes    Start date: 12/16/1990    Quit date: 12/15/2020    Years since quitting: 2.9   Smokeless tobacco: Never  Vaping Use   Vaping status: Never Used  Substance and Sexual Activity   Alcohol use: Not Currently   Drug  use: No   Sexual activity: Yes  Other Topics Concern   Not on file  Social History Narrative   Right handed   Lives with husband   caffeine-none   Retired- Human resources officer   Social Drivers of Corporate investment banker Strain: Not on file  Food Insecurity: Low Risk  (08/23/2023)   Received from [facility]   Hunger Vital Sign    Within the past 12 months, you worried that your food would run out before you got money to buy more: Never true    Within the past 12 months, the food you bought just didn't last and you didn't have money to get more. : Never true  Transportation Needs: No Transportation Needs (08/23/2023)   Received from [facility]    In the past 12 months, has lack of reliable transportation kept you from medical appointments, meetings, work or from getting things needed for daily living? : No  Physical Activity: Not on file  Stress: Not on file  Social Connections: Not on file  Intimate Partner Violence: Patient Declined (06/22/2022)   Humiliation, Afraid, Rape, and Kick questionnaire    Fear of Current or Ex-Partner: Patient declined    Emotionally Abused: Patient declined    Physically Abused: Patient declined    Sexually Abused: Patient declined      Modena Callander, M.D. Ph.D.  [facility] [address], [address] [place], [place] [address] Ph: [phone] Fax: [phone]  CC:  Dean Clarity, MD [address] [place],  [place] [address]  Gerome Brunet, DO

## 2023-12-03 ENCOUNTER — Telehealth: Payer: Self-pay | Admitting: Neurology

## 2023-12-03 NOTE — Patient Instructions (Signed)
 SURGICAL WAITING ROOM VISITATION  Patients having surgery or a procedure may have no more than 2 support people in the waiting area - these visitors may rotate.    Children under the age of 58 must have an adult with them who is not the patient.  Visitors with respiratory illnesses are discouraged from visiting and should remain at home.  If the patient needs to stay at the hospital during part of their recovery, the visitor guidelines for inpatient rooms apply. Pre-op nurse will coordinate an appropriate time for 1 support person to accompany patient in pre-op.  This support person may not rotate.    Please refer to the [facility] website for the visitor guidelines for Inpatients (after your surgery is over and you are in a regular room).       Your procedure is scheduled on: 12/13/23   Report to [facility] Main Entrance    Report to admitting at 7:45 AM   Call this number if you have problems the morning of surgery [phone]   Do not eat food :After Midnight.   After Midnight you may have the following liquids until 7:15 AM DAY OF SURGERY  Water  Non-Citrus Juices (without pulp, NO RED-Apple, White grape, White cranberry) Black Coffee (NO MILK/CREAM OR CREAMERS, sugar ok)  Clear Tea (NO MILK/CREAM OR CREAMERS, sugar ok) regular and decaf                             Plain Jell-O (NO RED)                                           Fruit ices (not with fruit pulp, NO RED)                                     Popsicles (NO RED)                                                               Sports drinks like Gatorade (NO RED)                  The day of surgery:  Drink ONE (1) Pre-Surgery Clear Ensure  at  7:15 AM the morning of surgery. Drink in one sitting. Do not sip.  This drink was given to you during your hospital  pre-op appointment visit. Nothing else to drink after completing the  Pre-Surgery Clear Ensure .  Oral Hygiene is also important to reduce your  risk of infection.                                    Remember - BRUSH YOUR TEETH THE MORNING OF SURGERY WITH YOUR REGULAR TOOTHPASTE  DENTURES WILL BE REMOVED PRIOR TO SURGERY PLEASE DO NOT APPLY Poly grip OR ADHESIVES!!!   Do NOT smoke after Midnight   Stop all vitamins and herbal supplements 7 days before surgery.   Take these medicines the morning of surgery with A  SIP OF WATER : Amlodipine , Atorvastatin , Prematin, Pantoprazole              You may not have any metal on your body including hair pins, jewelry, and body piercing             Do not wear make-up, lotions, powders, perfumes/cologne, or deodorant  Do not wear nail polish including gel and S&S, artificial/acrylic nails, or any other type of covering on natural nails including finger and toenails. If you have artificial nails, gel coating, etc. that needs to be removed by a nail salon please have this removed prior to surgery or surgery may need to be canceled/ delayed if the surgeon/ anesthesia feels like they are unable to be safely monitored.   Do not shave  48 hours prior to surgery.    Do not bring valuables to the hospital. [facility] IS NOT             RESPONSIBLE   FOR VALUABLES.   Contacts, glasses, dentures or bridgework may not be worn into surgery.   Bring small overnight bag day of surgery.   DO NOT BRING YOUR HOME MEDICATIONS TO THE HOSPITAL. PHARMACY WILL DISPENSE MEDICATIONS LISTED ON YOUR MEDICATION LIST TO YOU DURING YOUR ADMISSION IN THE HOSPITAL!    Patients discharged on the day of surgery will not be allowed to drive home.  Someone NEEDS to stay with you for the first 24 hours after anesthesia.   Special Instructions: Bring a copy of your healthcare power of attorney and living will documents the day of surgery if you haven't scanned them before.              Please read over the following fact sheets you were given: IF YOU HAVE QUESTIONS ABOUT YOUR PRE-OP INSTRUCTIONS PLEASE CALL [phone]  [name]   If you received a COVID test during your pre-op visit  it is requested that you wear a mask when out in public, stay away from anyone that may not be feeling well and notify your surgeon if you develop symptoms. If you test positive for Covid or have been in contact with anyone that has tested positive in the last 10 days please notify you surgeon.      Pre-operative 5 CHG Bath Instructions   You can play a key role in reducing the risk of infection after surgery. Your skin needs to be as free of germs as possible. You can reduce the number of germs on your skin by washing with CHG (chlorhexidine  gluconate) soap before surgery. CHG is an antiseptic soap that kills germs and continues to kill germs even after washing.   DO NOT use if you have an allergy to chlorhexidine /CHG or antibacterial soaps. If your skin becomes reddened or irritated, stop using the CHG and notify one of our RNs at [phone].   Please shower with the CHG soap starting 4 days before surgery using the following schedule:     Please keep in mind the following:  DO NOT shave, including legs and underarms, starting the day of your first shower.   You may shave your face at any point before/day of surgery.  Place clean sheets on your bed the day you start using CHG soap. Use a clean washcloth (not used since being washed) for each shower. DO NOT sleep with pets once you start using the CHG.   CHG Shower Instructions:  If you choose to wash your hair and private area, wash first with your normal  shampoo/soap.  After you use shampoo/soap, rinse your hair and body thoroughly to remove shampoo/soap residue.  Turn the water  OFF and apply about 3 tablespoons (45 ml) of CHG soap to a CLEAN washcloth.  Apply CHG soap ONLY FROM YOUR NECK DOWN TO YOUR TOES (washing for 3-5 minutes)  DO NOT use CHG soap on face, private areas, open wounds, or sores.  Pay special attention to the area where your surgery is being  performed.  If you are having back surgery, having someone wash your back for you may be helpful. Wait 2 minutes after CHG soap is applied, then you may rinse off the CHG soap.  Pat dry with a clean towel  Put on clean clothes/pajamas   If you choose to wear lotion, please use ONLY the CHG-compatible lotions on the back of this paper.     Additional instructions for the day of surgery: DO NOT APPLY any lotions, deodorants, cologne, or perfumes.   Put on clean/comfortable clothes.  Brush your teeth.  Ask your nurse before applying any prescription medications to the skin.      CHG Compatible Lotions   Aveeno Moisturizing lotion  Cetaphil Moisturizing Cream  Cetaphil Moisturizing Lotion  Clairol Herbal Essence Moisturizing Lotion, Dry Skin  Clairol Herbal Essence Moisturizing Lotion, Extra Dry Skin  Clairol Herbal Essence Moisturizing Lotion, Normal Skin  Curel Age Defying Therapeutic Moisturizing Lotion with Alpha Hydroxy  Curel Extreme Care Body Lotion  Curel Soothing Hands Moisturizing Hand Lotion  Curel Therapeutic Moisturizing Cream, Fragrance-Free  Curel Therapeutic Moisturizing Lotion, Fragrance-Free  Curel Therapeutic Moisturizing Lotion, Original Formula  Eucerin Daily Replenishing Lotion  Eucerin Dry Skin Therapy Plus Alpha Hydroxy Crme  Eucerin Dry Skin Therapy Plus Alpha Hydroxy Lotion  Eucerin Original Crme  Eucerin Original Lotion  Eucerin Plus Crme Eucerin Plus Lotion  Eucerin TriLipid Replenishing Lotion  Keri Anti-Bacterial Hand Lotion  Keri Deep Conditioning Original Lotion Dry Skin Formula Softly Scented  Keri Deep Conditioning Original Lotion, Fragrance Free Sensitive Skin Formula  Keri Lotion Fast Absorbing Fragrance Free Sensitive Skin Formula  Keri Lotion Fast Absorbing Softly Scented Dry Skin Formula  Keri Original Lotion  Keri Skin Renewal Lotion Keri Silky Smooth Lotion  Keri Silky Smooth Sensitive Skin Lotion  Nivea Body Creamy Conditioning  Oil  Nivea Body Extra Enriched Lotion  Nivea Body Original Lotion  Nivea Body Sheer Moisturizing Lotion Nivea Crme  Nivea Skin Firming Lotion  NutraDerm 30 Skin Lotion  NutraDerm Skin Lotion  NutraDerm Therapeutic Skin Cream  NutraDerm Therapeutic Skin Lotion  ProShield Protective Hand Cream   Incentive Spirometer  An incentive spirometer is a tool that can help keep your lungs clear and active. This tool measures how well you are filling your lungs with each breath. Taking long deep breaths may help reverse or decrease the chance of developing breathing (pulmonary) problems (especially infection) following: A long period of time when you are unable to move or be active. BEFORE THE PROCEDURE  If the spirometer includes an indicator to show your best effort, your nurse or respiratory therapist will set it to a desired goal. If possible, sit up straight or lean slightly forward. Try not to slouch. Hold the incentive spirometer in an upright position. INSTRUCTIONS FOR USE  Sit on the edge of your bed if possible, or sit up as far as you can in bed or on a chair. Hold the incentive spirometer in an upright position. Breathe out normally. Place the mouthpiece in your mouth and seal your  lips tightly around it. Breathe in slowly and as deeply as possible, raising the piston or the ball toward the top of the column. Hold your breath for 3-5 seconds or for as long as possible. Allow the piston or ball to fall to the bottom of the column. Remove the mouthpiece from your mouth and breathe out normally. Rest for a few seconds and repeat Steps 1 through 7 at least 10 times every 1-2 hours when you are awake. Take your time and take a few normal breaths between deep breaths. The spirometer may include an indicator to show your best effort. Use the indicator as a goal to work toward during each repetition. After each set of 10 deep breaths, practice coughing to be sure your lungs are clear. If you  have an incision (the cut made at the time of surgery), support your incision when coughing by placing a pillow or rolled up towels firmly against it. Once you are able to get out of bed, walk around indoors and cough well. You may stop using the incentive spirometer when instructed by your caregiver.  RISKS AND COMPLICATIONS Take your time so you do not get dizzy or light-headed. If you are in pain, you may need to take or ask for pain medication before doing incentive spirometry. It is harder to take a deep breath if you are having pain. AFTER USE Rest and breathe slowly and easily. It can be helpful to keep track of a log of your progress. Your caregiver can provide you with a simple table to help with this. If you are using the spirometer at home, follow these instructions: SEEK MEDICAL CARE IF:  You are having difficultly using the spirometer. You have trouble using the spirometer as often as instructed. Your pain medication is not giving enough relief while using the spirometer. You develop fever of 100.5 F (38.1 C) or higher. SEEK IMMEDIATE MEDICAL CARE IF:  You cough up bloody sputum that had not been present before. You develop fever of 102 F (38.9 C) or greater. You develop worsening pain at or near the incision site. MAKE SURE YOU:  Understand these instructions. Will watch your condition. Will get help right away if you are not doing well or get worse. Document Released: 08/13/2006 Document Revised: 06/25/2011 Document Reviewed: 10/14/2006   WHAT IS A BLOOD TRANSFUSION? Blood Transfusion Information  A transfusion is the replacement of blood or some of its parts. Blood is made up of multiple cells which provide different functions. Red blood cells carry oxygen and are used for blood loss replacement. White blood cells fight against infection. Platelets control bleeding. Plasma helps clot blood. Other blood products are available for specialized needs, such as  hemophilia or other clotting disorders. BEFORE THE TRANSFUSION  Who gives blood for transfusions?  Healthy volunteers who are fully evaluated to make sure their blood is safe. This is blood bank blood. Transfusion therapy is the safest it has ever been in the practice of medicine. Before blood is taken from a donor, a complete history is taken to make sure that person has no history of diseases nor engages in risky social behavior (examples are intravenous drug use or sexual activity with multiple partners). The donor's travel history is screened to minimize risk of transmitting infections, such as malaria. The donated blood is tested for signs of infectious diseases, such as HIV and hepatitis. The blood is then tested to be sure it is compatible with you in order to minimize the  chance of a transfusion reaction. If you or a relative donates blood, this is often done in anticipation of surgery and is not appropriate for emergency situations. It takes many days to process the donated blood. RISKS AND COMPLICATIONS Although transfusion therapy is very safe and saves many lives, the main dangers of transfusion include:  Getting an infectious disease. Developing a transfusion reaction. This is an allergic reaction to something in the blood you were given. Every precaution is taken to prevent this. The decision to have a blood transfusion has been considered carefully by your caregiver before blood is given. Blood is not given unless the benefits outweigh the risks. AFTER THE TRANSFUSION Right after receiving a blood transfusion, you will usually feel much better and more energetic. This is especially true if your red blood cells have gotten low (anemic). The transfusion raises the level of the red blood cells which carry oxygen, and this usually causes an energy increase. The nurse administering the transfusion will monitor you carefully for complications. HOME CARE INSTRUCTIONS  No special instructions  are needed after a transfusion. You may find your energy is better. Speak with your caregiver about any limitations on activity for underlying diseases you may have. SEEK MEDICAL CARE IF:  Your condition is not improving after your transfusion. You develop redness or irritation at the intravenous (IV) site. SEEK IMMEDIATE MEDICAL CARE IF:  Any of the following symptoms occur over the next 12 hours: Shaking chills. You have a temperature by mouth above 102 F (38.9 C), not controlled by medicine. Chest, back, or muscle pain. People around you feel you are not acting correctly or are confused. Shortness of breath or difficulty breathing. Dizziness and fainting. You get a rash or develop hives. You have a decrease in urine output. Your urine turns a dark color or changes to pink, red, or brown. Any of the following symptoms occur over the next 10 days: You have a temperature by mouth above 102 F (38.9 C), not controlled by medicine. Shortness of breath. Weakness after normal activity. The white part of the eye turns yellow (jaundice). You have a decrease in the amount of urine or are urinating less often. Your urine turns a dark color or changes to pink, red, or brown. Document Released: 03/30/2000 Document Revised: 06/25/2011 Document Reviewed: 11/17/2007 [facility] Patient Information 2014 [place], [place].

## 2023-12-03 NOTE — Telephone Encounter (Signed)
 [facility] is going to take this patient.

## 2023-12-03 NOTE — Progress Notes (Signed)
 COVID Vaccine received:  []  No []  Yes Date of any COVID positive Test in last 90 days:  PCP - Lonell Collet DO Cardiologist -   Chest x-ray -  EKG -  11/19/23 Epic Stress Test -  ECHO - 05/07/22 Epic Cardiac Cath -   Bowel Prep - []  No  []   Yes ______  Pacemaker / ICD device []  No []  Yes   Spinal Cord Stimulator:[]  No []  Yes       History of Sleep Apnea? []  No []  Yes   CPAP used?- []  No []  Yes    Does the patient monitor blood sugar?          []  No []  Yes  []  N/A  Patient has: []  NO Hx DM   []  Pre-DM                 []  DM1  []   DM2 Does patient have a [employer] or Dexacom? []  No []  Yes   Fasting Blood Sugar Ranges-  Checks Blood Sugar _____ times a day  GLP1 agonist / usual dose -  GLP1 instructions:  SGLT-2 inhibitors / usual dose -  SGLT-2 instructions:   Blood Thinner / Instructions: Aspirin  Instructions:  Comments:   Activity level: Patient is able / unable to climb a flight of stairs without difficulty; []  No CP  []  No SOB, but would have ___   Patient can / can not perform ADLs without assistance.   Anesthesia review:   Patient denies shortness of breath, fever, cough and chest pain at PAT appointment.  Patient verbalized understanding and agreement to the Pre-Surgical Instructions that were given to them at this PAT appointment. Patient was also educated of the need to review these PAT instructions again prior to his/her surgery.I reviewed the appropriate phone numbers to call if they have any and questions or concerns.

## 2023-12-04 ENCOUNTER — Telehealth: Payer: Self-pay | Admitting: Neurology

## 2023-12-04 NOTE — Telephone Encounter (Signed)
MRI orders sent to [facility] [phone]

## 2023-12-04 NOTE — Telephone Encounter (Signed)
 Carola From [facility]  Called to let MD know that they have had a 3 day delay Pt will start  Therapy st home tomorrow  12-05-23

## 2023-12-05 ENCOUNTER — Encounter (HOSPITAL_COMMUNITY)
Admission: RE | Admit: 2023-12-05 | Discharge: 2023-12-05 | Disposition: A | Discharge status: Home / Self Care | Source: Ambulatory Visit | Attending: Anesthesiology | Admitting: Anesthesiology

## 2023-12-09 ENCOUNTER — Telehealth: Payer: Self-pay

## 2023-12-09 NOTE — Telephone Encounter (Signed)
 FYI ~ Surgery was planned for January 08, 2024.  Patient deceased Received: Today [name] [name] ORN, RN  Estle Huguley K [facility], [name] passed away yesterday per her family.  [name] [name]

## 2023-12-10 ENCOUNTER — Telehealth: Payer: Self-pay | Admitting: Neurology

## 2023-12-10 NOTE — Telephone Encounter (Signed)
 [name] from [name] called wanting to inform the provider that they had started the starter care but the family just called in and let them know that the pt had expired this weekend.

## 2023-12-10 NOTE — Telephone Encounter (Signed)
 Was contacted by home health to do verbal orders. I told them that I was notified that the pt has passed.

## 2023-12-13 ENCOUNTER — Ambulatory Visit (HOSPITAL_COMMUNITY): Admit: 2023-12-13 | Admitting: Orthopaedic Surgery

## 2023-12-13 SURGERY — ARTHROPLASTY, KNEE, TOTAL
Anesthesia: Spinal | Site: Knee | Laterality: Right

## 2023-12-16 DEATH — deceased

## 2023-12-26 ENCOUNTER — Encounter: Admitting: Orthopaedic Surgery

## 2024-06-08 ENCOUNTER — Ambulatory Visit: Admitting: Neurology
